# Patient Record
Sex: Male | Born: 1945
Health system: Southern US, Community
[De-identification: ages and names within clinical notes are randomized; demographics above are authoritative.]

## PROBLEM LIST (undated history)

## (undated) DIAGNOSIS — C801 Malignant (primary) neoplasm, unspecified: Secondary | ICD-10-CM

## (undated) DIAGNOSIS — J449 Chronic obstructive pulmonary disease, unspecified: Secondary | ICD-10-CM

## (undated) DIAGNOSIS — K219 Gastro-esophageal reflux disease without esophagitis: Secondary | ICD-10-CM

## (undated) DIAGNOSIS — E785 Hyperlipidemia, unspecified: Secondary | ICD-10-CM

## (undated) DIAGNOSIS — E119 Type 2 diabetes mellitus without complications: Secondary | ICD-10-CM

## (undated) DIAGNOSIS — F419 Anxiety disorder, unspecified: Secondary | ICD-10-CM

## (undated) HISTORY — DX: Type 2 diabetes mellitus without complications: E11.9

## (undated) HISTORY — DX: Chronic obstructive pulmonary disease, unspecified: J44.9

## (undated) HISTORY — DX: Anxiety disorder, unspecified: F41.9

## (undated) HISTORY — PX: EYE SURGERY: SHX253

## (undated) HISTORY — PX: APPENDECTOMY: SHX54

## (undated) HISTORY — DX: Hyperlipidemia, unspecified: E78.5

---

## 2005-07-02 ENCOUNTER — Emergency Department: Payer: Self-pay | Admitting: Unknown Physician Specialty

## 2005-07-12 ENCOUNTER — Emergency Department: Payer: Self-pay | Admitting: Unknown Physician Specialty

## 2005-11-04 ENCOUNTER — Ambulatory Visit: Payer: Self-pay | Admitting: Internal Medicine

## 2010-05-01 ENCOUNTER — Ambulatory Visit: Payer: Self-pay | Admitting: Family Medicine

## 2015-04-11 ENCOUNTER — Ambulatory Visit (INDEPENDENT_AMBULATORY_CARE_PROVIDER_SITE_OTHER): Payer: Medicare Other | Admitting: Family Medicine

## 2015-04-11 ENCOUNTER — Encounter: Payer: Self-pay | Admitting: Family Medicine

## 2015-04-11 VITALS — BP 107/56 | HR 85 | Temp 97.7°F | Ht 68.0 in | Wt 119.0 lb

## 2015-04-11 DIAGNOSIS — E1169 Type 2 diabetes mellitus with other specified complication: Secondary | ICD-10-CM | POA: Insufficient documentation

## 2015-04-11 DIAGNOSIS — E119 Type 2 diabetes mellitus without complications: Secondary | ICD-10-CM

## 2015-04-11 DIAGNOSIS — E785 Hyperlipidemia, unspecified: Secondary | ICD-10-CM | POA: Diagnosis not present

## 2015-04-11 LAB — BAYER DCA HB A1C WAIVED: HB A1C (BAYER DCA - WAIVED): 9.9 % — ABNORMAL HIGH (ref <7.0)

## 2015-04-11 NOTE — Assessment & Plan Note (Signed)
The current medical regimen is effective;  continue present plan and medications.  

## 2015-04-11 NOTE — Assessment & Plan Note (Signed)
Discuss poor control but better from last visit Discussed humalog dosing Offered endo referral and pt refusing for now Reviewed meds and taking

## 2015-04-11 NOTE — Progress Notes (Signed)
   BP 107/56 mmHg  Pulse 85  Temp(Src) 97.7 F (36.5 C)  Ht '5\' 8"'$  (1.727 m)  Wt 119 lb (53.978 kg)  BMI 18.10 kg/m2  SpO2 95%   Subjective:    Patient ID: Colin Novak, male    DOB: 1946/04/11, 69 y.o.   MRN: [id number]  HPI: Colin Novak is a 69 y.o. male  Chief Complaint  Patient presents with  . Diabetes  . Hyperlipidemia  Insulin using 10 u lantus and fasting glu around 100 Only taking humalog 10u at breakfast No prob with chol meds feeling well No leg feet c/o No hypo spells Problems have been long term  Relevant past medical, surgical, family and social history reviewed and updated as indicated. Interim medical history since our last visit reviewed. Allergies and medications reviewed and updated.  Review of Systems  Constitutional: Negative.   Respiratory: Negative.   Cardiovascular: Negative.     Per HPI unless specifically indicated above     Objective:    BP 107/56 mmHg  Pulse 85  Temp(Src) 97.7 F (36.5 C)  Ht '5\' 8"'$  (1.727 m)  Wt 119 lb (53.978 kg)  BMI 18.10 kg/m2  SpO2 95%  Wt Readings from Last 3 Encounters:  04/11/15 119 lb (53.978 kg)  12/13/14 112 lb (50.803 kg)    Physical Exam  Constitutional: He is oriented to person, place, and time. He appears well-developed and well-nourished. No distress.  HENT:  Head: Normocephalic and atraumatic.  Right Ear: Hearing normal.  Left Ear: Hearing normal.  Nose: Nose normal.  Eyes: Conjunctivae and lids are normal. Right eye exhibits no discharge. Left eye exhibits no discharge. No scleral icterus.  Cardiovascular: Normal rate, regular rhythm and normal heart sounds.   Pulmonary/Chest: Effort normal and breath sounds normal. No respiratory distress.  Musculoskeletal: Normal range of motion. He exhibits no edema.  Foot exam normal  Neurological: He is alert and oriented to person, place, and time. He displays no tremor. No sensory deficit.  Skin: Skin is intact. No rash noted.  Psychiatric: He  has a normal mood and affect. His speech is normal and behavior is normal. Judgment and thought content normal. Cognition and memory are normal.    No results found for this or any previous visit.    Assessment & Plan:   Problem List Items Addressed This Visit      Endocrine   Diabetes mellitus without complication - Primary    Discuss poor control but better from last visit Discussed humalog dosing Offered endo referral and pt refusing for now Reviewed meds and taking      Relevant Orders   Bayer DCA Hb A1c Waived   Basic metabolic panel   Hemoglobin A1c   Urinalysis, Routine w reflex microscopic (not at [facility])   TSH   Lipid panel   CBC with Differential/Platelet   Comprehensive metabolic panel     Other   Hyperlipidemia    The current medical regimen is effective;  continue present plan and medications.       Relevant Orders   Hemoglobin A1c   Urinalysis, Routine w reflex microscopic (not at [facility])   TSH   Lipid panel   CBC with Differential/Platelet   Comprehensive metabolic panel       Follow up plan: Return in about 3 months (around 07/12/2015) for Physical Exam.

## 2015-04-12 LAB — BASIC METABOLIC PANEL
BUN/Creatinine Ratio: 20 (ref 10–22)
BUN: 14 mg/dL (ref 8–27)
CO2: 21 mmol/L (ref 18–29)
Calcium: 9.8 mg/dL (ref 8.6–10.2)
Chloride: 100 mmol/L (ref 97–108)
Creatinine, Ser: 0.71 mg/dL — ABNORMAL LOW (ref 0.76–1.27)
GFR calc Af Amer: 110 mL/min/{1.73_m2} (ref >59)
GFR calc non Af Amer: 95 mL/min/{1.73_m2} (ref >59)
Glucose: 68 mg/dL (ref 65–99)
Potassium: 4.2 mmol/L (ref 3.5–5.2)
Sodium: 140 mmol/L (ref 134–144)

## 2015-05-07 ENCOUNTER — Other Ambulatory Visit: Payer: Self-pay | Admitting: Family Medicine

## 2015-06-18 ENCOUNTER — Other Ambulatory Visit: Payer: Self-pay | Admitting: Family Medicine

## 2015-07-18 ENCOUNTER — Telehealth: Payer: Self-pay

## 2015-07-18 ENCOUNTER — Other Ambulatory Visit: Payer: Self-pay | Admitting: Family Medicine

## 2015-07-18 MED ORDER — GLUCOSE BLOOD VI STRP: 1.0000 | ORAL_STRIP | Freq: Two times a day (BID) | Status: DC | PRN

## 2015-07-18 NOTE — Telephone Encounter (Signed)
[facility] requesting  Rx for One Touch Ultra Test Strips #100  11 refills  Note:patient tests twice daily AND DX

## 2015-07-19 ENCOUNTER — Other Ambulatory Visit: Payer: Self-pay

## 2015-07-19 MED ORDER — GLUCOSE BLOOD VI STRP: 1.0000 | ORAL_STRIP | Freq: Two times a day (BID) | Status: DC | PRN

## 2015-07-24 ENCOUNTER — Encounter: Payer: Self-pay | Admitting: Family Medicine

## 2015-07-24 ENCOUNTER — Ambulatory Visit (INDEPENDENT_AMBULATORY_CARE_PROVIDER_SITE_OTHER): Payer: Medicare Other | Admitting: Family Medicine

## 2015-07-24 VITALS — BP 94/63 | HR 93 | Temp 97.6°F | Ht 67.7 in | Wt 111.0 lb

## 2015-07-24 DIAGNOSIS — N400 Enlarged prostate without lower urinary tract symptoms: Secondary | ICD-10-CM | POA: Diagnosis not present

## 2015-07-24 DIAGNOSIS — E46 Unspecified protein-calorie malnutrition: Secondary | ICD-10-CM | POA: Diagnosis not present

## 2015-07-24 DIAGNOSIS — Z23 Encounter for immunization: Secondary | ICD-10-CM | POA: Diagnosis not present

## 2015-07-24 DIAGNOSIS — J42 Unspecified chronic bronchitis: Secondary | ICD-10-CM

## 2015-07-24 DIAGNOSIS — Z0000 Encounter for general adult medical examination without abnormal findings: Secondary | ICD-10-CM

## 2015-07-24 DIAGNOSIS — J449 Chronic obstructive pulmonary disease, unspecified: Secondary | ICD-10-CM | POA: Insufficient documentation

## 2015-07-24 DIAGNOSIS — Z125 Encounter for screening for malignant neoplasm of prostate: Secondary | ICD-10-CM | POA: Diagnosis not present

## 2015-07-24 DIAGNOSIS — E785 Hyperlipidemia, unspecified: Secondary | ICD-10-CM

## 2015-07-24 DIAGNOSIS — L989 Disorder of the skin and subcutaneous tissue, unspecified: Secondary | ICD-10-CM | POA: Diagnosis not present

## 2015-07-24 DIAGNOSIS — E119 Type 2 diabetes mellitus without complications: Secondary | ICD-10-CM | POA: Diagnosis not present

## 2015-07-24 LAB — URINALYSIS, ROUTINE W REFLEX MICROSCOPIC
Bilirubin, UA: NEGATIVE
Ketones, UA: NEGATIVE
Leukocytes, UA: NEGATIVE
Nitrite, UA: NEGATIVE
Specific Gravity, UA: 1.02 (ref 1.005–1.030)
Urobilinogen, Ur: 0.2 mg/dL (ref 0.2–1.0)
pH, UA: 5.5 (ref 5.0–7.5)

## 2015-07-24 LAB — MICROSCOPIC EXAMINATION: Renal Epithel, UA: NONE SEEN /hpf

## 2015-07-24 LAB — BAYER DCA HB A1C WAIVED: HB A1C (BAYER DCA - WAIVED): 8.6 % — ABNORMAL HIGH (ref <7.0)

## 2015-07-24 NOTE — Assessment & Plan Note (Signed)
Discussed malnutrition difficulty with healing need for better diet exercise and vitamins

## 2015-07-24 NOTE — Progress Notes (Signed)
BP 94/63 mmHg  Pulse 93  Temp(Src) 97.6 F (36.4 C)  Ht 5' 7.7" (1.72 m)  Wt 111 lb (50.349 kg)  BMI 17.02 kg/m2  SpO2 96%   Subjective:    Patient ID: Colin Novak, male    DOB: [birth year], 69 y.o.   MRN: [id number]  HPI: Colin Novak is a 69 y.o. male  Chief Complaint  Patient presents with  . Annual Exam   Patient follow-up for physical has all in all been doing well. Had [place] nurse visit about 2 weeks ago and comment on his blood pressure being low. As a consequence patient's been worried about medications causing low blood pressure and stopped everything but the insulin. Has not checked his blood pressure or blood sugar since. Has otherwise felt okay.   Patient's weight from this summer is down 8 pounds States it's hard to fix for just 1 person  Glucose was checked during nurse visit and was 100  Relevant past medical, surgical, family and social history reviewed and updated as indicated. Interim medical history since our last visit reviewed. Allergies and medications reviewed and updated.  Review of Systems  Constitutional: Negative.   HENT: Negative.   Eyes: Negative.   Respiratory: Negative.   Cardiovascular: Negative.   Gastrointestinal: Negative.   Endocrine: Negative.   Genitourinary: Negative.   Musculoskeletal: Negative.   Skin: Negative.   Allergic/Immunologic: Negative.   Neurological: Negative.   Hematological: Negative.   Psychiatric/Behavioral: Negative.     Per HPI unless specifically indicated above     Objective:    BP 94/63 mmHg  Pulse 93  Temp(Src) 97.6 F (36.4 C)  Ht 5' 7.7" (1.72 m)  Wt 111 lb (50.349 kg)  BMI 17.02 kg/m2  SpO2 96%  Wt Readings from Last 3 Encounters:  07/24/15 111 lb (50.349 kg)  04/11/15 119 lb (53.978 kg)  12/13/14 112 lb (50.803 kg)    Physical Exam  Constitutional: He is oriented to person, place, and time. He appears well-developed and well-nourished.  HENT:  Head: Normocephalic and  atraumatic.  Right Ear: External ear normal.  Left Ear: External ear normal.  Eyes: Conjunctivae and EOM are normal. Pupils are equal, round, and reactive to light.  Neck: Normal range of motion. Neck supple.  Cardiovascular: Normal rate, regular rhythm, normal heart sounds and intact distal pulses.   Pulmonary/Chest: Effort normal and breath sounds normal.  Abdominal: Soft. Bowel sounds are normal. There is no splenomegaly or hepatomegaly.  Genitourinary: Rectum normal and penis normal.  Prostate enlarged no nodules  Musculoskeletal: Normal range of motion.  Neurological: He is alert and oriented to person, place, and time. He has normal reflexes.  Skin: No rash noted. No erythema.  Nonhealing inflamed squamous cell appearing skin lesion right posterior neck occasionally bleeds when scratched stays irritated.  Psychiatric: He has a normal mood and affect. His behavior is normal. Judgment and thought content normal.    Results for orders placed or performed in visit on 04/11/15  Bayer DCA Hb A1c Waived  Result Value Ref Range   Bayer DCA Hb A1c Waived 9.9 (H) <6.9 %  Basic metabolic panel  Result Value Ref Range   Glucose 68 65 - 99 mg/dL   BUN 14 8 - 27 mg/dL   Creatinine, Ser 0.71 (L) 0.76 - 1.27 mg/dL   GFR calc non Af Amer 95 >59 mL/min/1.73   GFR calc Af Amer 110 >59 mL/min/1.73   BUN/Creatinine Ratio 20 10 - 22  Sodium 140 134 - 144 mmol/L   Potassium 4.2 3.5 - 5.2 mmol/L   Chloride 100 97 - 108 mmol/L   CO2 21 18 - 29 mmol/L   Calcium 9.8 8.6 - 10.2 mg/dL      Assessment & Plan:   Problem List Items Addressed This Visit      Respiratory   COPD (chronic obstructive pulmonary disease)    Stable with no complaints        Endocrine   Diabetes mellitus without complication    Continued poor control will get back on medications and eat better      Relevant Orders   Comprehensive metabolic panel   Bayer DCA Hb A1c Waived     Musculoskeletal and Integument    Skin lesion     Genitourinary   BPH (benign prostatic hyperplasia)   Relevant Orders   PSA   Urinalysis, Routine w reflex microscopic (not at [facility])     Other   Hyperlipidemia    The current medical regimen is effective;  continue present plan and medications.       Relevant Orders   Comprehensive metabolic panel   Lipid panel   Malnutrition    Discussed malnutrition difficulty with healing need for better diet exercise and vitamins      Relevant Orders   CBC with Differential/Platelet   Urinalysis, Routine w reflex microscopic (not at [facility])   TSH    Other Visit Diagnoses    Immunization due    -  Primary    Relevant Orders    Flu Vaccine QUAD 36+ mos PF IM (Fluarix & Fluzone Quad PF) (Completed)    PE (physical exam), annual        Relevant Orders    Comprehensive metabolic panel    Lipid panel    CBC with Differential/Platelet    PSA    Urinalysis, Routine w reflex microscopic (not at [facility])    TSH        Follow up plan: Return in about 3 months (around 10/23/2015), or if symptoms worsen or fail to improve, for Appointment shave biopsy neck lesion, Physical Exam.

## 2015-07-24 NOTE — Assessment & Plan Note (Signed)
Stable with no complaints

## 2015-07-24 NOTE — Assessment & Plan Note (Signed)
Continued poor control will get back on medications and eat better

## 2015-07-24 NOTE — Assessment & Plan Note (Signed)
The current medical regimen is effective;  continue present plan and medications.  

## 2015-07-25 ENCOUNTER — Encounter: Payer: Self-pay | Admitting: Family Medicine

## 2015-07-25 LAB — COMPREHENSIVE METABOLIC PANEL
ALT: 23 IU/L (ref 0–44)
AST: 16 IU/L (ref 0–40)
Albumin/Globulin Ratio: 1.9 (ref 1.1–2.5)
Albumin: 4.4 g/dL (ref 3.5–4.8)
Alkaline Phosphatase: 81 IU/L (ref 39–117)
BUN/Creatinine Ratio: 20 (ref 10–22)
BUN: 14 mg/dL (ref 8–27)
Bilirubin Total: 0.3 mg/dL (ref 0.0–1.2)
CO2: 25 mmol/L (ref 18–29)
Calcium: 9.3 mg/dL (ref 8.6–10.2)
Chloride: 99 mmol/L (ref 97–108)
Creatinine, Ser: 0.71 mg/dL — ABNORMAL LOW (ref 0.76–1.27)
GFR calc Af Amer: 110 mL/min/{1.73_m2} (ref >59)
GFR calc non Af Amer: 95 mL/min/{1.73_m2} (ref >59)
Globulin, Total: 2.3 g/dL (ref 1.5–4.5)
Glucose: 195 mg/dL — ABNORMAL HIGH (ref 65–99)
Potassium: 4.9 mmol/L (ref 3.5–5.2)
Sodium: 140 mmol/L (ref 134–144)
Total Protein: 6.7 g/dL (ref 6.0–8.5)

## 2015-07-25 LAB — CBC WITH DIFFERENTIAL/PLATELET
Basophils Absolute: 0.1 10*3/uL (ref 0.0–0.2)
Basos: 1 %
EOS (ABSOLUTE): 0.1 10*3/uL (ref 0.0–0.4)
Eos: 1 %
Hematocrit: 46.1 % (ref 37.5–51.0)
Hemoglobin: 16.3 g/dL (ref 12.6–17.7)
Immature Grans (Abs): 0 10*3/uL (ref 0.0–0.1)
Immature Granulocytes: 0 %
Lymphocytes Absolute: 2.3 10*3/uL (ref 0.7–3.1)
Lymphs: 32 %
MCH: 33.6 pg — ABNORMAL HIGH (ref 26.6–33.0)
MCHC: 35.4 g/dL (ref 31.5–35.7)
MCV: 95 fL (ref 79–97)
Monocytes Absolute: 0.7 10*3/uL (ref 0.1–0.9)
Monocytes: 10 %
Neutrophils Absolute: 4.1 10*3/uL (ref 1.4–7.0)
Neutrophils: 56 %
Platelets: 246 10*3/uL (ref 150–379)
RBC: 4.85 x10E6/uL (ref 4.14–5.80)
RDW: 12.8 % (ref 12.3–15.4)
WBC: 7.3 10*3/uL (ref 3.4–10.8)

## 2015-07-25 LAB — LIPID PANEL
Chol/HDL Ratio: 2.3 ratio units (ref 0.0–5.0)
Cholesterol, Total: 113 mg/dL (ref 100–199)
HDL: 49 mg/dL (ref >39)
LDL Calculated: 39 mg/dL (ref 0–99)
Triglycerides: 126 mg/dL (ref 0–149)
VLDL Cholesterol Cal: 25 mg/dL (ref 5–40)

## 2015-07-25 LAB — PSA: Prostate Specific Ag, Serum: 2.7 ng/mL (ref 0.0–4.0)

## 2015-07-25 LAB — TSH: TSH: 0.83 u[IU]/mL (ref 0.450–4.500)

## 2015-10-22 ENCOUNTER — Other Ambulatory Visit: Payer: Self-pay | Admitting: Family Medicine

## 2015-10-23 NOTE — Telephone Encounter (Signed)
Due for physical.

## 2015-10-23 NOTE — Telephone Encounter (Signed)
Please schedule for CPE with MAC, DJ will do med refill once he is scheduled

## 2015-10-24 NOTE — Telephone Encounter (Signed)
Appt made 12/05/15

## 2015-11-19 LAB — HM DIABETES EYE EXAM

## 2015-11-20 ENCOUNTER — Other Ambulatory Visit: Payer: Self-pay

## 2015-11-20 NOTE — Telephone Encounter (Signed)
Requesting 90 day Rx [facility]  Patient has appointment 12/05/15 with MAC

## 2015-11-21 MED ORDER — EZETIMIBE-SIMVASTATIN 10-40 MG PO TABS: 1.0000 | ORAL_TABLET | Freq: Every day | ORAL | Status: DC

## 2015-11-23 ENCOUNTER — Telehealth: Payer: Self-pay | Admitting: Family Medicine

## 2015-11-23 NOTE — Telephone Encounter (Signed)
[facility] called and stated that the pt needed a new rx sent with diagnosis codes and directions sent for test strips.

## 2015-11-26 NOTE — Telephone Encounter (Signed)
Paperwork being worked on for details, will fax

## 2015-11-27 NOTE — Telephone Encounter (Signed)
This encounter was created in error - please disregard.

## 2015-11-28 ENCOUNTER — Telehealth: Payer: Self-pay | Admitting: Family Medicine

## 2015-11-28 NOTE — Telephone Encounter (Signed)
Pharmacy called in and stated that they received a faxed rx for novofine pen needles but the directions weren't specific. They would like to have a new rx sent over either e scribe or fax with specific directions and diagnosis codes.

## 2015-11-29 MED ORDER — NOVOFINE 32G X 6 MM MISC: Status: DC

## 2015-12-03 ENCOUNTER — Other Ambulatory Visit: Payer: Self-pay | Admitting: Family Medicine

## 2015-12-03 MED ORDER — NOVOFINE 32G X 6 MM MISC: Status: DC

## 2015-12-05 ENCOUNTER — Ambulatory Visit (INDEPENDENT_AMBULATORY_CARE_PROVIDER_SITE_OTHER): Payer: Medicare Other | Admitting: Family Medicine

## 2015-12-05 ENCOUNTER — Encounter: Payer: Self-pay | Admitting: Family Medicine

## 2015-12-05 VITALS — BP 99/58 | HR 80 | Temp 97.6°F | Ht 68.0 in | Wt 121.0 lb

## 2015-12-05 DIAGNOSIS — L989 Disorder of the skin and subcutaneous tissue, unspecified: Secondary | ICD-10-CM

## 2015-12-05 DIAGNOSIS — J42 Unspecified chronic bronchitis: Secondary | ICD-10-CM

## 2015-12-05 DIAGNOSIS — Z23 Encounter for immunization: Secondary | ICD-10-CM | POA: Diagnosis not present

## 2015-12-05 DIAGNOSIS — E46 Unspecified protein-calorie malnutrition: Secondary | ICD-10-CM

## 2015-12-05 DIAGNOSIS — L821 Other seborrheic keratosis: Secondary | ICD-10-CM | POA: Diagnosis not present

## 2015-12-05 DIAGNOSIS — E119 Type 2 diabetes mellitus without complications: Secondary | ICD-10-CM | POA: Diagnosis not present

## 2015-12-05 DIAGNOSIS — D485 Neoplasm of uncertain behavior of skin: Secondary | ICD-10-CM | POA: Diagnosis not present

## 2015-12-05 DIAGNOSIS — E785 Hyperlipidemia, unspecified: Secondary | ICD-10-CM | POA: Diagnosis not present

## 2015-12-05 LAB — LP+ALT+AST PICCOLO, WAIVED
ALT (SGPT) Piccolo, Waived: 25 U/L (ref 10–47)
AST (SGOT) Piccolo, Waived: 28 U/L (ref 11–38)
Chol/HDL Ratio Piccolo,Waive: 2.3 mg/dL
Cholesterol Piccolo, Waived: 101 mg/dL (ref <200)
HDL Chol Piccolo, Waived: 45 mg/dL — ABNORMAL LOW (ref >59)
LDL Chol Calc Piccolo Waived: 35 mg/dL (ref <100)
Triglycerides Piccolo,Waived: 108 mg/dL (ref <150)
VLDL Chol Calc Piccolo,Waive: 22 mg/dL (ref <30)

## 2015-12-05 LAB — BAYER DCA HB A1C WAIVED: HB A1C (BAYER DCA - WAIVED): 8.7 % — ABNORMAL HIGH (ref <7.0)

## 2015-12-05 MED ORDER — CANAGLIFLOZIN 300 MG PO TABS: 300.0000 mg | ORAL_TABLET | Freq: Every day | ORAL | Status: DC

## 2015-12-05 MED ORDER — METFORMIN HCL 500 MG PO TABS: 500.0000 mg | ORAL_TABLET | Freq: Two times a day (BID) | ORAL | Status: DC

## 2015-12-05 MED ORDER — INSULIN GLARGINE 100 UNIT/ML SOLOSTAR PEN: 10.0000 [IU] | PEN_INJECTOR | Freq: Every day | SUBCUTANEOUS | Status: DC

## 2015-12-05 MED ORDER — INSULIN LISPRO 100 UNIT/ML (KWIKPEN): PEN_INJECTOR | SUBCUTANEOUS | Status: DC

## 2015-12-05 NOTE — Addendum Note (Signed)
Addended byGolden Pop on: 12/05/2015 01:43 PM   Modules accepted: Miquel Dunn

## 2015-12-05 NOTE — Progress Notes (Signed)
BP 99/58 mmHg  Pulse 80  Temp(Src) 97.6 F (36.4 C)  Ht '5\' 8"'$  (1.727 m)  Wt 121 lb (54.885 kg)  BMI 18.40 kg/m2  SpO2 95%   Subjective:    Patient ID: Colin Novak, male    DOB: Dec 07, 1945, 70 y.o.   MRN: [id number]  HPI: Colin Novak is a 70 y.o. male  Chief Complaint  Patient presents with  . Diabetes  . Hyperlipidemia   patient recheck diabetes not checking glucoses and home are just an insulin but seems like use doing better according to the patient patient eating a little better Patient is gained a little bit of weight Taking medications with no side effects and taking faithfully Lesion on neck is still irritated occasionally bleeds scratches a lot and does not heal.  Relevant past medical, surgical, family and social history reviewed and updated as indicated. Interim medical history since our last visit reviewed. Allergies and medications reviewed and updated.  Review of Systems  Constitutional: Negative.   Respiratory: Negative.   Cardiovascular: Negative.     Per HPI unless specifically indicated above     Objective:    BP 99/58 mmHg  Pulse 80  Temp(Src) 97.6 F (36.4 C)  Ht '5\' 8"'$  (1.727 m)  Wt 121 lb (54.885 kg)  BMI 18.40 kg/m2  SpO2 95%  Wt Readings from Last 3 Encounters:  12/05/15 121 lb (54.885 kg)  07/24/15 111 lb (50.349 kg)  04/11/15 119 lb (53.978 kg)    Physical Exam  Constitutional: He is oriented to person, place, and time. He appears well-developed and well-nourished. No distress.  HENT:  Head: Normocephalic and atraumatic.  Right Ear: Hearing normal.  Left Ear: Hearing normal.  Nose: Nose normal.  Eyes: Conjunctivae and lids are normal. Right eye exhibits no discharge. Left eye exhibits no discharge. No scleral icterus.  Cardiovascular: Normal rate and regular rhythm.   Pulmonary/Chest: Effort normal and breath sounds normal. No respiratory distress.  Musculoskeletal: Normal range of motion.  Neurological: He is alert and  oriented to person, place, and time.  Skin: Skin is intact. No rash noted.  Nonhealing skin lesions  Psychiatric: He has a normal mood and affect. His speech is normal and behavior is normal. Judgment and thought content normal. Cognition and memory are normal.    Results for orders placed or performed in visit on 11/22/15  HM DIABETES EYE EXAM  Result Value Ref Range   HM Diabetic Eye Exam No Retinopathy No Retinopathy      Assessment & Plan:   Problem List Items Addressed This Visit      Respiratory   COPD (chronic obstructive pulmonary disease) ([place])    The current medical regimen is effective;  continue present plan and medications.         Endocrine   Diabetes mellitus without complication ([place])    Discussed diabetes care and treatment of adjusting insulin checking blood sugar gave patient new glucometer      Relevant Medications   metFORMIN (GLUCOPHAGE) 500 MG tablet   canagliflozin (INVOKANA) 300 MG TABS tablet   Insulin Glargine (LANTUS) 100 UNIT/ML Solostar Pen   insulin lispro (HUMALOG KWIKPEN) 100 UNIT/ML KiwkPen   Other Relevant Orders   Bayer DCA Hb A1c Waived     Musculoskeletal and Integument   Skin lesion    Informed consent on skin lesion and shave biopsy to lesions proximally 1 cm were prepped with Betadine and alcohol and infiltrated with Xylocaine with epinephrine and  shaved off bleeding controlled with silver nitrate stick. Patient tolerated the procedure well. Lesions sent for pathology able top and bottom from neck      Relevant Orders   Pathology     Other   Malnutrition ([place])    Starting the better and gained a little bit of weight      Relevant Orders   Basic metabolic panel   Hyperlipidemia    The current medical regimen is effective;  continue present plan and medications.       Relevant Orders   LP+ALT+AST Piccolo, [place]    Other Visit Diagnoses    Immunization due    -  Primary    Relevant Orders    Pneumococcal  polysaccharide vaccine 23-valent greater than or equal to 2yo subcutaneous/IM (Completed)        Follow up plan: Return in about 3 months (around 03/03/2016) for a1c.

## 2015-12-05 NOTE — Assessment & Plan Note (Signed)
Discussed diabetes care and treatment of adjusting insulin checking blood sugar gave patient new glucometer

## 2015-12-05 NOTE — Assessment & Plan Note (Signed)
The current medical regimen is effective;  continue present plan and medications.  

## 2015-12-05 NOTE — Assessment & Plan Note (Signed)
Starting the better and gained a little bit of weight

## 2015-12-05 NOTE — Assessment & Plan Note (Signed)
Informed consent on skin lesion and shave biopsy to lesions proximally 1 cm were prepped with Betadine and alcohol and infiltrated with Xylocaine with epinephrine and shaved off bleeding controlled with silver nitrate stick. Patient tolerated the procedure well. Lesions sent for pathology able top and bottom from neck

## 2015-12-06 ENCOUNTER — Encounter: Payer: Self-pay | Admitting: Family Medicine

## 2015-12-06 LAB — BASIC METABOLIC PANEL
BUN/Creatinine Ratio: 25 — ABNORMAL HIGH (ref 10–22)
BUN: 19 mg/dL (ref 8–27)
CO2: 24 mmol/L (ref 18–29)
Calcium: 9.6 mg/dL (ref 8.6–10.2)
Chloride: 99 mmol/L (ref 96–106)
Creatinine, Ser: 0.75 mg/dL — ABNORMAL LOW (ref 0.76–1.27)
GFR calc Af Amer: 107 mL/min/{1.73_m2} (ref >59)
GFR calc non Af Amer: 93 mL/min/{1.73_m2} (ref >59)
Glucose: 98 mg/dL (ref 65–99)
Potassium: 4.5 mmol/L (ref 3.5–5.2)
Sodium: 140 mmol/L (ref 134–144)

## 2015-12-07 LAB — PATHOLOGY

## 2015-12-10 ENCOUNTER — Telehealth: Payer: Self-pay | Admitting: Family Medicine

## 2015-12-10 NOTE — Telephone Encounter (Signed)
-----   Message from [place] sent at 12/10/2015  4:37 PM EST ----- labs

## 2015-12-10 NOTE — Telephone Encounter (Signed)
Phone call Discussed with patient biopsy report patient will keep covered with Vaseline keep his hands off and if lesion doesn't heal will reevaluate

## 2016-01-28 ENCOUNTER — Telehealth: Payer: Self-pay | Admitting: Family Medicine

## 2016-01-28 ENCOUNTER — Telehealth: Payer: Self-pay

## 2016-01-28 NOTE — Telephone Encounter (Signed)
[facility] Rx for [employer] faxed

## 2016-01-28 NOTE — Telephone Encounter (Signed)
Patient needs refill

## 2016-03-03 ENCOUNTER — Encounter: Payer: Self-pay | Admitting: Family Medicine

## 2016-03-06 ENCOUNTER — Ambulatory Visit: Payer: Medicare Other | Admitting: Family Medicine

## 2016-05-01 ENCOUNTER — Telehealth: Payer: Self-pay | Admitting: Family Medicine

## 2016-05-01 NOTE — Telephone Encounter (Signed)
Pt called needs lancets and test strips for Accu Check [employer], Accu Check soft clicks lancets. Pharm needs to be changed to [facility] in [place]. Thanks.

## 2016-05-01 NOTE — Telephone Encounter (Signed)
faxed

## 2016-05-05 ENCOUNTER — Other Ambulatory Visit: Payer: Self-pay | Admitting: Family Medicine

## 2016-07-09 ENCOUNTER — Ambulatory Visit (INDEPENDENT_AMBULATORY_CARE_PROVIDER_SITE_OTHER): Payer: Medicare Other | Admitting: Family Medicine

## 2016-07-09 ENCOUNTER — Encounter: Payer: Self-pay | Admitting: Family Medicine

## 2016-07-09 VITALS — BP 97/61 | HR 88 | Temp 97.9°F | Wt 115.0 lb

## 2016-07-09 DIAGNOSIS — E119 Type 2 diabetes mellitus without complications: Secondary | ICD-10-CM

## 2016-07-09 DIAGNOSIS — J42 Unspecified chronic bronchitis: Secondary | ICD-10-CM

## 2016-07-09 DIAGNOSIS — Z23 Encounter for immunization: Secondary | ICD-10-CM

## 2016-07-09 DIAGNOSIS — E46 Unspecified protein-calorie malnutrition: Secondary | ICD-10-CM

## 2016-07-09 LAB — BAYER DCA HB A1C WAIVED: HB A1C (BAYER DCA - WAIVED): 9.5 % — ABNORMAL HIGH (ref <7.0)

## 2016-07-09 LAB — MICROALBUMIN, URINE WAIVED
Creatinine, Urine Waived: 50 mg/dL (ref 10–300)
Microalb, Ur Waived: 10 mg/L (ref 0–19)

## 2016-07-09 MED ORDER — DULAGLUTIDE 0.75 MG/0.5ML ~~LOC~~ SOAJ: 0.7500 mg | SUBCUTANEOUS | 12 refills | Status: DC

## 2016-07-09 MED ORDER — EZETIMIBE-SIMVASTATIN 10-40 MG PO TABS: 1.0000 | ORAL_TABLET | Freq: Every day | ORAL | 1 refills | Status: DC

## 2016-07-09 NOTE — Assessment & Plan Note (Signed)
Control and noncompliance with diabetes medications reviewed importance of compliance issue of 9.5 hemoglobin A1c getting worse reviewed financial issues. Gave samples of Trulicity 9.39 mg and patient self-administered first dose here today and we'll give himself weekly. Patient will explore costs and medication usage in the meantime

## 2016-07-09 NOTE — Progress Notes (Signed)
BP 97/61 (BP Location: Left Arm, Patient Position: Sitting, Cuff Size: Small)   Pulse 88   Temp 97.9 F (36.6 C)   Wt 115 lb (52.2 kg) Comment: with shoes  SpO2 96%   BMI 17.49 kg/m    Subjective:    Patient ID: Colin Novak, male    DOB: 1946-01-08, 70 y.o.   MRN: [id number]  HPI: MAHAMED ZALEWSKI is a 70 y.o. male  Chief Complaint  Patient presents with  . Diabetes  . URI  Patient with mild head cold is getting better wondering if he can get flu shot symptoms and no fever will go ahead with flu shot Reviewed patient not taking Humalog until today. Had some low sugar spells previously Taking Lantus 10 units and fasting blood sugars in the 80s to 90s. Reviewed with patient about other medications and seems to be taking having some financial difficulty also.  Relevant past medical, surgical, family and social history reviewed and updated as indicated. Interim medical history since our last visit reviewed. Allergies and medications reviewed and updated.  Review of Systems  Constitutional: Negative.   Respiratory: Negative.   Cardiovascular: Negative.     Per HPI unless specifically indicated above     Objective:    BP 97/61 (BP Location: Left Arm, Patient Position: Sitting, Cuff Size: Small)   Pulse 88   Temp 97.9 F (36.6 C)   Wt 115 lb (52.2 kg) Comment: with shoes  SpO2 96%   BMI 17.49 kg/m   Wt Readings from Last 3 Encounters:  07/09/16 115 lb (52.2 kg)  12/05/15 121 lb (54.9 kg)  07/24/15 111 lb (50.3 kg)    Physical Exam  Constitutional: He is oriented to person, place, and time. He appears well-developed and well-nourished. No distress.  HENT:  Head: Normocephalic and atraumatic.  Right Ear: Hearing normal.  Left Ear: Hearing normal.  Nose: Nose normal.  Eyes: Conjunctivae and lids are normal. Right eye exhibits no discharge. Left eye exhibits no discharge. No scleral icterus.  Cardiovascular: Normal rate, regular rhythm and normal heart sounds.     Pulmonary/Chest: Effort normal and breath sounds normal. No respiratory distress.  Musculoskeletal: Normal range of motion.  Neurological: He is alert and oriented to person, place, and time.  Skin: Skin is intact. No rash noted.  Psychiatric: He has a normal mood and affect. His speech is normal and behavior is normal. Judgment and thought content normal. Cognition and memory are normal.    Results for orders placed or performed in visit on 30/16/01  Basic metabolic panel  Result Value Ref Range   Glucose 98 65 - 99 mg/dL   BUN 19 8 - 27 mg/dL   Creatinine, Ser 0.75 (L) 0.76 - 1.27 mg/dL   GFR calc non Af Amer 93 >59 mL/min/1.73   GFR calc Af Amer 107 >59 mL/min/1.73   BUN/Creatinine Ratio 25 (H) 10 - 22   Sodium 140 134 - 144 mmol/L   Potassium 4.5 3.5 - 5.2 mmol/L   Chloride 99 96 - 106 mmol/L   CO2 24 18 - 29 mmol/L   Calcium 9.6 8.6 - 10.2 mg/dL  Bayer DCA Hb A1c Waived  Result Value Ref Range   Bayer DCA Hb A1c Waived 8.7 (H) <7.0 %  LP+ALT+AST Piccolo, Waived  Result Value Ref Range   ALT (SGPT) Piccolo, Waived 25 10 - 47 U/L   AST (SGOT) Piccolo, Waived 28 11 - 38 U/L   Cholesterol Piccolo, Waived 101 <  200 mg/dL   HDL Chol Piccolo, Waived 45 (L) >59 mg/dL   Triglycerides Piccolo,Waived 108 <150 mg/dL   Chol/HDL Ratio Piccolo,Waive 2.3 mg/dL   LDL Chol Calc Piccolo Waived 35 <100 mg/dL   VLDL Chol Calc Piccolo,Waive 22 <30 mg/dL  Pathology  Result Value Ref Range   PATH REPORT.SITE OF ORIGIN SPEC Comment    . Comment    PATH REPORT.RELEVANT HX SPEC Comment    PATH REPORT.FINAL DX SPEC Comment    SIGNED OUT BY: Comment    GROSS DESCRIPTION: Comment    . Comment    PAYMENT PROCEDURE Comment       Assessment & Plan:   Problem List Items Addressed This Visit      Respiratory   COPD (chronic obstructive pulmonary disease) ([place])    The current medical regimen is effective;  continue present plan and medications.         Endocrine   Diabetes mellitus  without complication ([place]) - Primary    Control and noncompliance with diabetes medications reviewed importance of compliance issue of 9.5 hemoglobin A1c getting worse reviewed financial issues. Gave samples of Trulicity 9.62 mg and patient self-administered first dose here today and we'll give himself weekly. Patient will explore costs and medication usage in the meantime       Relevant Medications   Dulaglutide (TRULICITY) 8.36 OQ/9.4TM SOPN   Other Relevant Orders   Bayer DCA Hb A1c Waived   Microalbumin, Urine Waived     Other   Malnutrition ([place])    Discussed diet nutrition and eating patient's lost weight from last visit but diabetes extremely poor control.       Other Visit Diagnoses   None.      Follow up plan: Return in about 4 weeks (around 08/06/2016) for Recheck in about a month and hopefully will be up to afford medications if still not doing better wi.

## 2016-07-09 NOTE — Assessment & Plan Note (Signed)
Discussed diet nutrition and eating patient's lost weight from last visit but diabetes extremely poor control.

## 2016-07-09 NOTE — Assessment & Plan Note (Signed)
The current medical regimen is effective;  continue present plan and medications.  

## 2016-07-09 NOTE — Patient Instructions (Signed)

## 2016-07-09 NOTE — Addendum Note (Signed)
Addended by: Moss Mc on: 07/09/2016 02:02 PM   Modules accepted: Orders

## 2016-07-10 ENCOUNTER — Telehealth: Payer: Self-pay | Admitting: Family Medicine

## 2016-07-10 NOTE — Telephone Encounter (Signed)
Portia from [facility] called to inform PCP that Dulaglutide (TRULICITY) 7.34 KA/7.6OT SOPN was denied and they asked that the pt first try Victoza. A letter will be mailed to the office.

## 2016-07-14 MED ORDER — LIRAGLUTIDE 18 MG/3ML ~~LOC~~ SOPN: 1.8000 mg | PEN_INJECTOR | Freq: Every day | SUBCUTANEOUS | 12 refills | Status: DC

## 2016-08-06 ENCOUNTER — Ambulatory Visit (INDEPENDENT_AMBULATORY_CARE_PROVIDER_SITE_OTHER): Payer: Medicare Other | Admitting: Family Medicine

## 2016-08-06 ENCOUNTER — Encounter: Payer: Self-pay | Admitting: Family Medicine

## 2016-08-06 VITALS — BP 84/50 | HR 83 | Temp 97.8°F | Wt 113.8 lb

## 2016-08-06 DIAGNOSIS — E119 Type 2 diabetes mellitus without complications: Secondary | ICD-10-CM | POA: Diagnosis not present

## 2016-08-06 DIAGNOSIS — J42 Unspecified chronic bronchitis: Secondary | ICD-10-CM | POA: Diagnosis not present

## 2016-08-06 DIAGNOSIS — J209 Acute bronchitis, unspecified: Secondary | ICD-10-CM

## 2016-08-06 DIAGNOSIS — E440 Moderate protein-calorie malnutrition: Secondary | ICD-10-CM

## 2016-08-06 MED ORDER — AZITHROMYCIN 250 MG PO TABS: ORAL_TABLET | ORAL | 0 refills | Status: DC

## 2016-08-06 NOTE — Assessment & Plan Note (Signed)
Reviewed diet and nutrition

## 2016-08-06 NOTE — Assessment & Plan Note (Signed)
Reviewed dosing on Victoza gave written directions

## 2016-08-06 NOTE — Addendum Note (Signed)
Addended byGolden Pop on: 08/06/2016 01:44 PM   Modules accepted: Orders

## 2016-08-06 NOTE — Progress Notes (Addendum)
BP (!) 84/50 (BP Location: Left Arm, Patient Position: Sitting, Cuff Size: Normal)   Pulse 83   Temp 97.8 F (36.6 C)   Wt 113 lb 12.8 oz (51.6 kg)   SpO2 96%   BMI 17.30 kg/m    Subjective:    Patient ID: Colin Novak, male    DOB: 02-11-1946, 70 y.o.   MRN: [id number]  HPI: MARTAVIOUS HARTEL is a 70 y.o. male  Patient follow-up diabetes and change of medications Trulicity not covered by insurance but Victoza is. Patient uses his last Trulicity sample last weeks now ready to start Victoza daily injections. Discussed with patient taper up by 0.6 every 3-4 days. Gave written directions. Hasn't noticed any other issues with Trulicity. Taking other medications cholesterol Reviewed weight patient still losing weight Reviewed diet and nutrition and importance. Patient also with exacerbation of COPD with coughing barking been ongoing for weeks and getting worse. Relevant past medical, surgical, family and social history reviewed and updated as indicated. Interim medical history since our last visit reviewed. Allergies and medications reviewed and updated.  Review of Systems  Constitutional: Negative.   Respiratory: Negative.   Cardiovascular: Negative.     Per HPI unless specifically indicated above     Objective:    BP (!) 84/50 (BP Location: Left Arm, Patient Position: Sitting, Cuff Size: Normal)   Pulse 83   Temp 97.8 F (36.6 C)   Wt 113 lb 12.8 oz (51.6 kg)   SpO2 96%   BMI 17.30 kg/m   Wt Readings from Last 3 Encounters:  08/06/16 113 lb 12.8 oz (51.6 kg)  07/09/16 115 lb (52.2 kg)  12/05/15 121 lb (54.9 kg)    Physical Exam  Constitutional: He is oriented to person, place, and time. He appears well-developed and well-nourished. No distress.  HENT:  Head: Normocephalic and atraumatic.  Right Ear: Hearing normal.  Left Ear: Hearing normal.  Nose: Nose normal.  Eyes: Conjunctivae and lids are normal. Right eye exhibits no discharge. Left eye exhibits no  discharge. No scleral icterus.  Cardiovascular: Normal rate, regular rhythm and normal heart sounds.   Pulmonary/Chest: Effort normal and breath sounds normal. No respiratory distress.  Musculoskeletal: Normal range of motion.  Neurological: He is alert and oriented to person, place, and time.  Skin: Skin is intact. No rash noted.  Psychiatric: He has a normal mood and affect. His speech is normal and behavior is normal. Judgment and thought content normal. Cognition and memory are normal.    Results for orders placed or performed in visit on 07/09/16  Bayer DCA Hb A1c Waived  Result Value Ref Range   Bayer DCA Hb A1c Waived 9.5 (H) <7.0 %  Microalbumin, Urine Waived  Result Value Ref Range   Microalb, Ur Waived 10 0 - 19 mg/L   Creatinine, Urine Waived 50 10 - 300 mg/dL   Microalb/Creat Ratio 30-300 (H) <30 mg/g      Assessment & Plan:   Problem List Items Addressed This Visit      Endocrine   Diabetes mellitus without complication ([place])    Reviewed dosing on Victoza gave written directions        Other   Malnutrition ([place])    Reviewed diet and nutrition       Other Visit Diagnoses    Chronic bronchitis with acute exacerbation ([place])    -  Primary   Relevant Medications   azithromycin (ZITHROMAX) 250 MG tablet      Treatment  of AE CB Follow up plan: Return in about 2 months (around 10/06/2016) for Hemoglobin A1c.

## 2016-10-06 ENCOUNTER — Encounter: Payer: Self-pay | Admitting: Family Medicine

## 2016-10-06 ENCOUNTER — Ambulatory Visit (INDEPENDENT_AMBULATORY_CARE_PROVIDER_SITE_OTHER): Payer: Medicare Other | Admitting: Family Medicine

## 2016-10-06 ENCOUNTER — Ambulatory Visit: Payer: Medicare Other | Admitting: Family Medicine

## 2016-10-06 VITALS — BP 83/44 | HR 87 | Temp 97.5°F | Ht 68.5 in | Wt 114.4 lb

## 2016-10-06 DIAGNOSIS — Z1159 Encounter for screening for other viral diseases: Secondary | ICD-10-CM

## 2016-10-06 DIAGNOSIS — N401 Enlarged prostate with lower urinary tract symptoms: Secondary | ICD-10-CM | POA: Diagnosis not present

## 2016-10-06 DIAGNOSIS — J42 Unspecified chronic bronchitis: Secondary | ICD-10-CM

## 2016-10-06 DIAGNOSIS — Z0000 Encounter for general adult medical examination without abnormal findings: Secondary | ICD-10-CM

## 2016-10-06 DIAGNOSIS — E119 Type 2 diabetes mellitus without complications: Secondary | ICD-10-CM

## 2016-10-06 DIAGNOSIS — E7800 Pure hypercholesterolemia, unspecified: Secondary | ICD-10-CM

## 2016-10-06 DIAGNOSIS — R350 Frequency of micturition: Secondary | ICD-10-CM | POA: Diagnosis not present

## 2016-10-06 DIAGNOSIS — E440 Moderate protein-calorie malnutrition: Secondary | ICD-10-CM | POA: Diagnosis not present

## 2016-10-06 DIAGNOSIS — Z23 Encounter for immunization: Secondary | ICD-10-CM | POA: Diagnosis not present

## 2016-10-06 LAB — URINALYSIS, ROUTINE W REFLEX MICROSCOPIC
Bilirubin, UA: NEGATIVE
Ketones, UA: NEGATIVE
Leukocytes, UA: NEGATIVE
Nitrite, UA: NEGATIVE
Protein, UA: NEGATIVE
Specific Gravity, UA: 1.01 (ref 1.005–1.030)
Urobilinogen, Ur: 0.2 mg/dL (ref 0.2–1.0)
pH, UA: 6 (ref 5.0–7.5)

## 2016-10-06 LAB — MICROSCOPIC EXAMINATION
Bacteria, UA: NONE SEEN
RBC, UA: NONE SEEN /hpf (ref 0–?)

## 2016-10-06 LAB — BAYER DCA HB A1C WAIVED: HB A1C (BAYER DCA - WAIVED): 8.3 % — ABNORMAL HIGH (ref <7.0)

## 2016-10-06 NOTE — Assessment & Plan Note (Signed)
Discuss eating and nutrition importance for diabetes control.

## 2016-10-06 NOTE — Progress Notes (Signed)
BP (!) 83/44 (BP Location: Left Arm, Patient Position: Sitting, Cuff Size: Normal)   Pulse 87   Temp 97.5 F (36.4 C)   Ht 5' 8.5" (1.74 m)   Wt 114 lb 6.4 oz (51.9 kg)   SpO2 95%   BMI 17.14 kg/m    Subjective:    Patient ID: Colin Novak, male    DOB: [birth year], 70 y.o.   MRN: [id number]  HPI: KIERNAN Novak is a 70 y.o. male  Chief Complaint  Patient presents with  . Diabetes  Annual exam AWV metrics met  Patient follow-up doing well no complaints from diabetes noted low blood sugar spells using his diabetes medicines without problems. We are hopeful that may be things are getting too low with a low A1c will build Back on some of his expensive medicines. Doing well with cholesterol no issues Malnutrition still having problems with eating and good nutrition and is lost another couple of pounds. Smoking no issues with continuing to smoke patient is not going to quit smoking and will continue the expense.  Relevant past medical, surgical, family and social history reviewed and updated as indicated. Interim medical history since our last visit reviewed. Allergies and medications reviewed and updated.  Other than noted Review of Systems  Constitutional: Negative.   HENT: Negative.   Eyes: Negative.   Respiratory: Negative.   Cardiovascular: Negative.   Gastrointestinal: Negative.   Endocrine: Negative.   Genitourinary: Negative.   Musculoskeletal: Negative.   Skin: Negative.   Allergic/Immunologic: Negative.   Neurological: Negative.   Hematological: Negative.   Psychiatric/Behavioral: Negative.     Per HPI unless specifically indicated above     Objective:    BP (!) 83/44 (BP Location: Left Arm, Patient Position: Sitting, Cuff Size: Normal)   Pulse 87   Temp 97.5 F (36.4 C)   Ht 5' 8.5" (1.74 m)   Wt 114 lb 6.4 oz (51.9 kg)   SpO2 95%   BMI 17.14 kg/m   Wt Readings from Last 3 Encounters:  10/06/16 114 lb 6.4 oz (51.9 kg)  08/06/16 113 lb 12.8 oz  (51.6 kg)  07/09/16 115 lb (52.2 kg)    Physical Exam  Constitutional: He is oriented to person, place, and time. No distress.  skinny  HENT:  Head: Normocephalic and atraumatic.  Right Ear: Hearing and external ear normal.  Left Ear: Hearing and external ear normal.  Nose: Nose normal.  Eyes: Conjunctivae, EOM and lids are normal. Pupils are equal, round, and reactive to light. Right eye exhibits no discharge. Left eye exhibits no discharge. No scleral icterus.  Neck: Normal range of motion. Neck supple.  Cardiovascular: Normal rate, regular rhythm, normal heart sounds and intact distal pulses.   Pulmonary/Chest: Effort normal. No respiratory distress. He has wheezes.  Diminished breath sounds  Abdominal: Soft. Bowel sounds are normal. There is no splenomegaly or hepatomegaly.  Genitourinary: Rectum normal and penis normal.  Genitourinary Comments: BPH changes  Musculoskeletal: Normal range of motion.  Neurological: He is alert and oriented to person, place, and time. He has normal reflexes.  Skin: Skin is intact. No rash noted. No erythema.  Psychiatric: He has a normal mood and affect. His speech is normal and behavior is normal. Judgment and thought content normal. Cognition and memory are normal.   discuss getting a PSA and patient does not want PSA measured. Discussed doesn't want colonoscopy either.  Results for orders placed or performed in visit on 07/09/16  Bayer [facility]  Hb A1c Waived  Result Value Ref Range   Bayer DCA Hb A1c Waived 9.5 (H) <7.0 %  Microalbumin, Urine Waived  Result Value Ref Range   Microalb, Ur Waived 10 0 - 19 mg/L   Creatinine, Urine Waived 50 10 - 300 mg/dL   Microalb/Creat Ratio 30-300 (H) <30 mg/g      Assessment & Plan:   Problem List Items Addressed This Visit      Respiratory   COPD (chronic obstructive pulmonary disease) ([place])    The current medical regimen is effective;  continue present plan and medications.         Endocrine    Diabetes mellitus without complication ([place]) - Primary    Discussed need for better control better nutrition will continue current medications.      Relevant Orders   Bayer DCA Hb A1c Waived   Comprehensive metabolic panel   CBC with Differential/Platelet   TSH   Urinalysis, Routine w reflex microscopic     Genitourinary   BPH (benign prostatic hyperplasia)    Stable for now        Other   Hyperlipidemia    The current medical regimen is effective;  continue present plan and medications.       Relevant Orders   Lipid panel   Malnutrition ([place])    Discuss eating and nutrition importance for diabetes control.      Relevant Orders   Comprehensive metabolic panel   CBC with Differential/Platelet   TSH   Urinalysis, Routine w reflex microscopic    Other Visit Diagnoses    Need for hepatitis C screening test       Relevant Orders   Hepatitis C Antibody   Need for Tdap vaccination       Relevant Orders   Tdap vaccine greater than or equal to 7yo IM (Completed)   PE (physical exam), annual       Relevant Orders   Comprehensive metabolic panel   Lipid panel   CBC with Differential/Platelet   TSH   Urinalysis, Routine w reflex microscopic       Follow up plan: Return for Hemoglobin A1c.

## 2016-10-06 NOTE — Assessment & Plan Note (Signed)
Stable for now

## 2016-10-06 NOTE — Assessment & Plan Note (Signed)
The current medical regimen is effective;  continue present plan and medications.  

## 2016-10-06 NOTE — Assessment & Plan Note (Signed)
Discussed need for better control better nutrition will continue current medications.

## 2016-10-07 ENCOUNTER — Telehealth: Payer: Self-pay | Admitting: Family Medicine

## 2016-10-07 DIAGNOSIS — E7800 Pure hypercholesterolemia, unspecified: Secondary | ICD-10-CM

## 2016-10-07 LAB — COMPREHENSIVE METABOLIC PANEL
ALT: 60 IU/L — ABNORMAL HIGH (ref 0–44)
AST: 45 IU/L — ABNORMAL HIGH (ref 0–40)
Albumin/Globulin Ratio: 1.9 (ref 1.2–2.2)
Albumin: 4.3 g/dL (ref 3.5–4.8)
Alkaline Phosphatase: 75 IU/L (ref 39–117)
BUN/Creatinine Ratio: 26 — ABNORMAL HIGH (ref 10–24)
BUN: 20 mg/dL (ref 8–27)
Bilirubin Total: 0.4 mg/dL (ref 0.0–1.2)
CO2: 25 mmol/L (ref 18–29)
Calcium: 9.7 mg/dL (ref 8.6–10.2)
Chloride: 99 mmol/L (ref 96–106)
Creatinine, Ser: 0.76 mg/dL (ref 0.76–1.27)
GFR calc Af Amer: 106 mL/min/{1.73_m2} (ref >59)
GFR calc non Af Amer: 92 mL/min/{1.73_m2} (ref >59)
Globulin, Total: 2.3 g/dL (ref 1.5–4.5)
Glucose: 96 mg/dL (ref 65–99)
Potassium: 4.7 mmol/L (ref 3.5–5.2)
Sodium: 141 mmol/L (ref 134–144)
Total Protein: 6.6 g/dL (ref 6.0–8.5)

## 2016-10-07 LAB — CBC WITH DIFFERENTIAL/PLATELET
Basophils Absolute: 0.1 10*3/uL (ref 0.0–0.2)
Basos: 1 %
EOS (ABSOLUTE): 0.1 10*3/uL (ref 0.0–0.4)
Eos: 1 %
Hematocrit: 46.4 % (ref 37.5–51.0)
Hemoglobin: 16.4 g/dL (ref 13.0–17.7)
Immature Grans (Abs): 0 10*3/uL (ref 0.0–0.1)
Immature Granulocytes: 0 %
Lymphocytes Absolute: 2.2 10*3/uL (ref 0.7–3.1)
Lymphs: 38 %
MCH: 33.7 pg — ABNORMAL HIGH (ref 26.6–33.0)
MCHC: 35.3 g/dL (ref 31.5–35.7)
MCV: 95 fL (ref 79–97)
Monocytes Absolute: 0.5 10*3/uL (ref 0.1–0.9)
Monocytes: 9 %
Neutrophils Absolute: 3 10*3/uL (ref 1.4–7.0)
Neutrophils: 51 %
Platelets: 265 10*3/uL (ref 150–379)
RBC: 4.87 x10E6/uL (ref 4.14–5.80)
RDW: 14.1 % (ref 12.3–15.4)
WBC: 5.8 10*3/uL (ref 3.4–10.8)

## 2016-10-07 LAB — LIPID PANEL
Chol/HDL Ratio: 2.1 ratio units (ref 0.0–5.0)
Cholesterol, Total: 94 mg/dL — ABNORMAL LOW (ref 100–199)
HDL: 45 mg/dL (ref >39)
LDL Calculated: 33 mg/dL (ref 0–99)
Triglycerides: 80 mg/dL (ref 0–149)
VLDL Cholesterol Cal: 16 mg/dL (ref 5–40)

## 2016-10-07 LAB — TSH: TSH: 1.27 u[IU]/mL (ref 0.450–4.500)

## 2016-10-07 LAB — HEPATITIS C ANTIBODY: Hep C Virus Ab: <0.1 s/co ratio (ref 0.0–0.9)

## 2016-10-07 NOTE — Telephone Encounter (Signed)
Phone call Discussed with patient slight elevation in liver enzymes not taking extra Tylenol or alcohol. Patient is taking extra iron. Will stop iron and will recheck ALT, AST and lipids next office visit.

## 2016-11-03 ENCOUNTER — Ambulatory Visit: Payer: Medicare Other | Admitting: Family Medicine

## 2016-11-03 ENCOUNTER — Encounter: Payer: Medicare Other | Admitting: Family Medicine

## 2016-12-19 ENCOUNTER — Other Ambulatory Visit: Payer: Self-pay | Admitting: Family Medicine

## 2016-12-25 ENCOUNTER — Ambulatory Visit (INDEPENDENT_AMBULATORY_CARE_PROVIDER_SITE_OTHER): Payer: Medicare Other | Admitting: Family Medicine

## 2016-12-25 ENCOUNTER — Encounter: Payer: Self-pay | Admitting: Family Medicine

## 2016-12-25 VITALS — BP 109/65 | HR 86 | Ht 69.0 in | Wt 118.0 lb

## 2016-12-25 DIAGNOSIS — E119 Type 2 diabetes mellitus without complications: Secondary | ICD-10-CM | POA: Diagnosis not present

## 2016-12-25 DIAGNOSIS — Z79899 Other long term (current) drug therapy: Secondary | ICD-10-CM | POA: Diagnosis not present

## 2016-12-25 DIAGNOSIS — E440 Moderate protein-calorie malnutrition: Secondary | ICD-10-CM | POA: Diagnosis not present

## 2016-12-25 DIAGNOSIS — E7800 Pure hypercholesterolemia, unspecified: Secondary | ICD-10-CM | POA: Diagnosis not present

## 2016-12-25 LAB — LP+ALT+AST PICCOLO, WAIVED
ALT (SGPT) Piccolo, Waived: 35 U/L (ref 10–47)
AST (SGOT) Piccolo, Waived: 35 U/L (ref 11–38)
Chol/HDL Ratio Piccolo,Waive: 2 mg/dL
Cholesterol Piccolo, Waived: 93 mg/dL (ref <200)
HDL Chol Piccolo, Waived: 46 mg/dL — ABNORMAL LOW (ref >59)
LDL Chol Calc Piccolo Waived: 31 mg/dL (ref <100)
Triglycerides Piccolo,Waived: 82 mg/dL (ref <150)
VLDL Chol Calc Piccolo,Waive: 16 mg/dL (ref <30)

## 2016-12-25 LAB — BAYER DCA HB A1C WAIVED: HB A1C (BAYER DCA - WAIVED): 8.5 % — ABNORMAL HIGH (ref <7.0)

## 2016-12-25 NOTE — Assessment & Plan Note (Signed)
Worsening control of diabetes with good complaints medications will refer to endocrinology for further evaluation treatment adjusting and change in medications as indicated.

## 2016-12-25 NOTE — Assessment & Plan Note (Signed)
The current medical regimen is effective;  continue present plan and medications.  

## 2016-12-25 NOTE — Assessment & Plan Note (Signed)
Patient will continue working with diet and nutrition to eat healthy and gained a few pounds.

## 2016-12-25 NOTE — Progress Notes (Signed)
BP 109/65   Pulse 86   Ht '5\' 9"'$  (1.753 m)   Wt 118 lb (53.5 kg)   SpO2 98%   BMI 17.43 kg/m    Subjective:    Patient ID: Colin Novak, male    DOB: 04/10/1946, 71 y.o.   MRN: [id number]  HPI: Colin Novak is a 71 y.o. male  Recheck diabetes patient's eating maybe a little bit better taking medications faithfully no low blood sugar spells but still having a hard time concerned about [employer] and amputation advertisements. Cholesterol doing okay no issues.  Relevant past medical, surgical, family and social history reviewed and updated as indicated. Interim medical history since our last visit reviewed. Allergies and medications reviewed and updated.  Review of Systems  Constitutional: Negative.   Respiratory: Negative.   Cardiovascular: Negative.     Per HPI unless specifically indicated above     Objective:    BP 109/65   Pulse 86   Ht '5\' 9"'$  (1.753 m)   Wt 118 lb (53.5 kg)   SpO2 98%   BMI 17.43 kg/m   Wt Readings from Last 3 Encounters:  12/25/16 118 lb (53.5 kg)  10/06/16 114 lb 6.4 oz (51.9 kg)  08/06/16 113 lb 12.8 oz (51.6 kg)    Physical Exam  Constitutional: He is oriented to person, place, and time. He appears well-developed and well-nourished.  HENT:  Head: Normocephalic and atraumatic.  Eyes: Conjunctivae and EOM are normal.  Neck: Normal range of motion.  Cardiovascular: Normal rate, regular rhythm and normal heart sounds.   Pulmonary/Chest: Effort normal and breath sounds normal.  Musculoskeletal: Normal range of motion.  Neurological: He is alert and oriented to person, place, and time.  Skin: No erythema.  Psychiatric: He has a normal mood and affect. His behavior is normal. Judgment and thought content normal.    Results for orders placed or performed in visit on 10/06/16  Microscopic Examination  Result Value Ref Range   WBC, UA 0-5 0 - 5 /hpf   RBC, UA None seen 0 - 2 /hpf   Epithelial Cells (non renal) 0-10 0 - 10 /hpf   Bacteria, UA None seen None seen/Few  Bayer DCA Hb A1c Waived  Result Value Ref Range   Bayer DCA Hb A1c Waived 8.3 (H) <7.0 %  Hepatitis C Antibody  Result Value Ref Range   Hep C Virus Ab <0.1 0.0 - 0.9 s/co ratio  Comprehensive metabolic panel  Result Value Ref Range   Glucose 96 65 - 99 mg/dL   BUN 20 8 - 27 mg/dL   Creatinine, Ser 0.76 0.76 - 1.27 mg/dL   GFR calc non Af Amer 92 >59 mL/min/1.73   GFR calc Af Amer 106 >59 mL/min/1.73   BUN/Creatinine Ratio 26 (H) 10 - 24   Sodium 141 134 - 144 mmol/L   Potassium 4.7 3.5 - 5.2 mmol/L   Chloride 99 96 - 106 mmol/L   CO2 25 18 - 29 mmol/L   Calcium 9.7 8.6 - 10.2 mg/dL   Total Protein 6.6 6.0 - 8.5 g/dL   Albumin 4.3 3.5 - 4.8 g/dL   Globulin, Total 2.3 1.5 - 4.5 g/dL   Albumin/Globulin Ratio 1.9 1.2 - 2.2   Bilirubin Total 0.4 0.0 - 1.2 mg/dL   Alkaline Phosphatase 75 39 - 117 IU/L   AST 45 (H) 0 - 40 IU/L   ALT 60 (H) 0 - 44 IU/L  Lipid panel  Result Value Ref Range  Cholesterol, Total 94 (L) 100 - 199 mg/dL   Triglycerides 80 0 - 149 mg/dL   HDL 45 >39 mg/dL   VLDL Cholesterol Cal 16 5 - 40 mg/dL   LDL Calculated 33 0 - 99 mg/dL   Chol/HDL Ratio 2.1 0.0 - 5.0 ratio units  CBC with Differential/Platelet  Result Value Ref Range   WBC 5.8 3.4 - 10.8 x10E3/uL   RBC 4.87 4.14 - 5.80 x10E6/uL   Hemoglobin 16.4 13.0 - 17.7 g/dL   Hematocrit 46.4 37.5 - 51.0 %   MCV 95 79 - 97 fL   MCH 33.7 (H) 26.6 - 33.0 pg   MCHC 35.3 31.5 - 35.7 g/dL   RDW 14.1 12.3 - 15.4 %   Platelets 265 150 - 379 x10E3/uL   Neutrophils 51 Not Estab. %   Lymphs 38 Not Estab. %   Monocytes 9 Not Estab. %   Eos 1 Not Estab. %   Basos 1 Not Estab. %   Neutrophils Absolute 3.0 1.4 - 7.0 x10E3/uL   Lymphocytes Absolute 2.2 0.7 - 3.1 x10E3/uL   Monocytes Absolute 0.5 0.1 - 0.9 x10E3/uL   EOS (ABSOLUTE) 0.1 0.0 - 0.4 x10E3/uL   Basophils Absolute 0.1 0.0 - 0.2 x10E3/uL   Immature Granulocytes 0 Not Estab. %   Immature Grans (Abs) 0.0 0.0 - 0.1  x10E3/uL  TSH  Result Value Ref Range   TSH 1.270 0.450 - 4.500 uIU/mL  Urinalysis, Routine w reflex microscopic  Result Value Ref Range   Specific Gravity, UA 1.010 1.005 - 1.030   pH, UA 6.0 5.0 - 7.5   Color, UA Yellow Yellow   Appearance Ur Clear Clear   Leukocytes, UA Negative Negative   Protein, UA Negative Negative/Trace   Glucose, UA 3+ (A) Negative   Ketones, UA Negative Negative   RBC, UA Trace (A) Negative   Bilirubin, UA Negative Negative   Urobilinogen, Ur 0.2 0.2 - 1.0 mg/dL   Nitrite, UA Negative Negative   Microscopic Examination See below:       Assessment & Plan:   Problem List Items Addressed This Visit      Endocrine   Diabetes mellitus without complication ([place]) - Primary    Worsening control of diabetes with good complaints medications will refer to endocrinology for further evaluation treatment adjusting and change in medications as indicated.      Relevant Orders   Bayer [place] Hb A1c Waived   LP+ALT+AST [place], [place]   Ambulatory referral to Endocrinology     Other   Hyperlipidemia    The current medical regimen is effective;  continue present plan and medications.       Relevant Orders   Bayer DCA Hb A1c Waived   LP+ALT+AST [place], Waived   Malnutrition [facility])    Patient will continue working with diet and nutrition to eat healthy and gained a few pounds.       Other Visit Diagnoses    Medication management       Relevant Orders   Bayer DCA Hb A1c Waived   LP+ALT+AST [place], Waived     Extensive discussion of advanced care planning was held. This included the explanation and discussion of advanced directives such a standard forms which were given to the patient. He will complete these with his daughter.  Follow up plan: Return for June, BMP,  Lipids, ALT, AST.

## 2017-01-29 DIAGNOSIS — E1165 Type 2 diabetes mellitus with hyperglycemia: Secondary | ICD-10-CM | POA: Diagnosis not present

## 2017-01-29 DIAGNOSIS — Z794 Long term (current) use of insulin: Secondary | ICD-10-CM | POA: Insufficient documentation

## 2017-01-29 DIAGNOSIS — F17200 Nicotine dependence, unspecified, uncomplicated: Secondary | ICD-10-CM | POA: Diagnosis not present

## 2017-02-11 ENCOUNTER — Other Ambulatory Visit: Payer: Self-pay | Admitting: Family Medicine

## 2017-02-11 NOTE — Telephone Encounter (Signed)
Last OV: 12/25/16 Next OV: 04/01/17  BMP Latest Ref Rng & Units 10/06/2016 12/05/2015 07/24/2015  Glucose 65 - 99 mg/dL 96 98 195(H)  BUN 8 - 27 mg/dL '20 19 14  '$ Creatinine 0.76 - 1.27 mg/dL 0.76 0.75(L) 0.71(L)  BUN/Creat Ratio 10 - 24 26(H) 25(H) 20  Sodium 134 - 144 mmol/L 141 140 140  Potassium 3.5 - 5.2 mmol/L 4.7 4.5 4.9  Chloride 96 - 106 mmol/L 99 99 99  CO2 18 - 29 mmol/L '25 24 25  '$ Calcium 8.6 - 10.2 mg/dL 9.7 9.6 9.3

## 2017-04-01 ENCOUNTER — Encounter: Payer: Self-pay | Admitting: Family Medicine

## 2017-04-01 ENCOUNTER — Ambulatory Visit (INDEPENDENT_AMBULATORY_CARE_PROVIDER_SITE_OTHER): Payer: Medicare Other | Admitting: Family Medicine

## 2017-04-01 VITALS — BP 100/59 | HR 76 | Wt 115.0 lb

## 2017-04-01 DIAGNOSIS — E440 Moderate protein-calorie malnutrition: Secondary | ICD-10-CM | POA: Diagnosis not present

## 2017-04-01 DIAGNOSIS — E119 Type 2 diabetes mellitus without complications: Secondary | ICD-10-CM

## 2017-04-01 DIAGNOSIS — E7800 Pure hypercholesterolemia, unspecified: Secondary | ICD-10-CM | POA: Diagnosis not present

## 2017-04-01 DIAGNOSIS — D692 Other nonthrombocytopenic purpura: Secondary | ICD-10-CM

## 2017-04-01 LAB — LP+ALT+AST PICCOLO, WAIVED
ALT (SGPT) Piccolo, Waived: 48 U/L — ABNORMAL HIGH (ref 10–47)
AST (SGOT) Piccolo, Waived: 55 U/L — ABNORMAL HIGH (ref 11–38)
Chol/HDL Ratio Piccolo,Waive: 1.9 mg/dL
Cholesterol Piccolo, Waived: 96 mg/dL (ref <200)
HDL Chol Piccolo, Waived: 50 mg/dL — ABNORMAL LOW (ref >59)
LDL Chol Calc Piccolo Waived: 30 mg/dL (ref <100)
Triglycerides Piccolo,Waived: 77 mg/dL (ref <150)
VLDL Chol Calc Piccolo,Waive: 15 mg/dL (ref <30)

## 2017-04-01 LAB — BAYER DCA HB A1C WAIVED: HB A1C (BAYER DCA - WAIVED): 8.2 % — ABNORMAL HIGH (ref <7.0)

## 2017-04-01 NOTE — Assessment & Plan Note (Signed)
Followed by endocrinology and getting better with lowering A1c today.

## 2017-04-01 NOTE — Assessment & Plan Note (Signed)
Weight stable no real weight loss Discussed diet nutrition

## 2017-04-01 NOTE — Assessment & Plan Note (Signed)
The current medical regimen is effective;  continue present plan and medications.  

## 2017-04-01 NOTE — Assessment & Plan Note (Signed)
Patient with chronic bruising on arms not getting worse.

## 2017-04-01 NOTE — Progress Notes (Signed)
BP (!) 100/59   Pulse 76   Wt 115 lb (52.2 kg)   SpO2 95%   BMI 16.98 kg/m    Subjective:    Patient ID: Colin Novak, male    DOB: [birth year], 71 y.o.   MRN: [id number]  HPI: Colin Novak is a 71 y.o. male  Chief Complaint  Patient presents with  . Follow-up   Patient with diabetes being followed by endocrinology at [facility] clinic and doing better increase metformin patient's doing better with adjustments in his insulin and eating healthier. As a result patient's A1c is lowest it's been in 2 years. Patient with no hypoglycemia episodes.  Relevant past medical, surgical, family and social history reviewed and updated as indicated. Interim medical history since our last visit reviewed. Allergies and medications reviewed and updated.  Review of Systems  Constitutional: Negative.   Respiratory: Negative.   Cardiovascular: Negative.     Per HPI unless specifically indicated above     Objective:    BP (!) 100/59   Pulse 76   Wt 115 lb (52.2 kg)   SpO2 95%   BMI 16.98 kg/m   Wt Readings from Last 3 Encounters:  04/01/17 115 lb (52.2 kg)  12/25/16 118 lb (53.5 kg)  10/06/16 114 lb 6.4 oz (51.9 kg)    Physical Exam  Constitutional: He is oriented to person, place, and time. He appears well-developed and well-nourished.  HENT:  Head: Normocephalic and atraumatic.  Eyes: Conjunctivae and EOM are normal.  Neck: Normal range of motion.  Cardiovascular: Normal rate, regular rhythm and normal heart sounds.   Pulmonary/Chest: Effort normal and breath sounds normal.  Musculoskeletal: Normal range of motion.  Neurological: He is alert and oriented to person, place, and time.  Skin: No erythema.  Psychiatric: He has a normal mood and affect. His behavior is normal. Judgment and thought content normal.    Results for orders placed or performed in visit on 12/25/16  Bayer DCA Hb A1c Waived  Result Value Ref Range   Bayer DCA Hb A1c Waived 8.5 (H) <7.0 %  LP+ALT+AST  [place], Waived  Result Value Ref Range   ALT (SGPT) [place], Waived 35 10 - 47 U/L   AST (SGOT) Piccolo, Waived 35 11 - 38 U/L   Cholesterol Piccolo, Waived 93 <200 mg/dL   HDL Chol [place], Waived 46 (L) >59 mg/dL   Triglycerides Piccolo,Waived 82 <150 mg/dL   Chol/HDL Ratio Piccolo,Waive 2.0 mg/dL   LDL Chol Calc [place] Waived 31 <100 mg/dL   VLDL Chol Calc Piccolo,Waive 16 <30 mg/dL      Assessment & Plan:   Problem List Items Addressed This Visit      Cardiovascular and Mediastinum   Senile purpura (HCC)    Patient with chronic bruising on arms not getting worse.        Endocrine   Diabetes mellitus without complication ([place]) - Primary    Followed by endocrinology and getting better with lowering A1c today.      Relevant Orders   Basic metabolic panel   LP+ALT+AST [place], Waived   Bayer DCA Hb A1c Waived     Other   Hyperlipidemia    The current medical regimen is effective;  continue present plan and medications.       Relevant Orders   Basic metabolic panel   LP+ALT+AST [place], Waived   Bayer DCA Hb A1c Waived   Malnutrition ([place])    Weight stable no real weight loss Discussed diet  nutrition          Follow up plan: Return in about 6 months (around 10/01/2017) for Physical Exam.

## 2017-04-02 ENCOUNTER — Encounter: Payer: Self-pay | Admitting: Family Medicine

## 2017-04-02 LAB — BASIC METABOLIC PANEL
BUN/Creatinine Ratio: 22 (ref 10–24)
BUN: 15 mg/dL (ref 8–27)
CO2: 27 mmol/L (ref 18–29)
Calcium: 9.9 mg/dL (ref 8.6–10.2)
Chloride: 100 mmol/L (ref 96–106)
Creatinine, Ser: 0.68 mg/dL — ABNORMAL LOW (ref 0.76–1.27)
GFR calc Af Amer: 110 mL/min/{1.73_m2} (ref >59)
GFR calc non Af Amer: 95 mL/min/{1.73_m2} (ref >59)
Glucose: 128 mg/dL — ABNORMAL HIGH (ref 65–99)
Potassium: 4 mmol/L (ref 3.5–5.2)
Sodium: 142 mmol/L (ref 134–144)

## 2017-04-27 ENCOUNTER — Other Ambulatory Visit: Payer: Self-pay | Admitting: Family Medicine

## 2017-04-27 NOTE — Telephone Encounter (Signed)
Last OV: 04/01/17 Next OV: 10/05/17   No results found for: HGBA1C

## 2017-05-01 DIAGNOSIS — Z794 Long term (current) use of insulin: Secondary | ICD-10-CM | POA: Diagnosis not present

## 2017-05-01 DIAGNOSIS — R636 Underweight: Secondary | ICD-10-CM | POA: Diagnosis not present

## 2017-05-01 DIAGNOSIS — F17200 Nicotine dependence, unspecified, uncomplicated: Secondary | ICD-10-CM | POA: Diagnosis not present

## 2017-05-01 DIAGNOSIS — E1165 Type 2 diabetes mellitus with hyperglycemia: Secondary | ICD-10-CM | POA: Diagnosis not present

## 2017-08-11 DIAGNOSIS — F17200 Nicotine dependence, unspecified, uncomplicated: Secondary | ICD-10-CM | POA: Diagnosis not present

## 2017-08-11 DIAGNOSIS — E1165 Type 2 diabetes mellitus with hyperglycemia: Secondary | ICD-10-CM | POA: Diagnosis not present

## 2017-08-11 DIAGNOSIS — Z794 Long term (current) use of insulin: Secondary | ICD-10-CM | POA: Diagnosis not present

## 2017-09-19 ENCOUNTER — Other Ambulatory Visit: Payer: Self-pay | Admitting: Family Medicine

## 2017-10-05 ENCOUNTER — Encounter: Payer: Medicare Other | Admitting: Family Medicine

## 2017-10-29 ENCOUNTER — Encounter: Payer: Medicare Other | Admitting: Family Medicine

## 2017-11-12 DIAGNOSIS — E1165 Type 2 diabetes mellitus with hyperglycemia: Secondary | ICD-10-CM | POA: Diagnosis not present

## 2017-11-12 DIAGNOSIS — Z794 Long term (current) use of insulin: Secondary | ICD-10-CM | POA: Diagnosis not present

## 2017-11-19 ENCOUNTER — Ambulatory Visit: Payer: Medicare Other | Admitting: Family Medicine

## 2017-11-19 ENCOUNTER — Ambulatory Visit (INDEPENDENT_AMBULATORY_CARE_PROVIDER_SITE_OTHER): Payer: Medicare Other

## 2017-11-19 VITALS — BP 116/72 | HR 92 | Temp 98.3°F | Resp 16 | Ht 68.5 in | Wt 125.8 lb

## 2017-11-19 VITALS — BP 116/72 | HR 75 | Ht 68.5 in | Wt 125.8 lb

## 2017-11-19 DIAGNOSIS — E7800 Pure hypercholesterolemia, unspecified: Secondary | ICD-10-CM | POA: Diagnosis not present

## 2017-11-19 DIAGNOSIS — Z0000 Encounter for general adult medical examination without abnormal findings: Secondary | ICD-10-CM

## 2017-11-19 DIAGNOSIS — R600 Localized edema: Secondary | ICD-10-CM | POA: Diagnosis not present

## 2017-11-19 DIAGNOSIS — Z0001 Encounter for general adult medical examination with abnormal findings: Secondary | ICD-10-CM

## 2017-11-19 DIAGNOSIS — F321 Major depressive disorder, single episode, moderate: Secondary | ICD-10-CM | POA: Insufficient documentation

## 2017-11-19 DIAGNOSIS — E11649 Type 2 diabetes mellitus with hypoglycemia without coma: Secondary | ICD-10-CM | POA: Diagnosis not present

## 2017-11-19 DIAGNOSIS — N401 Enlarged prostate with lower urinary tract symptoms: Secondary | ICD-10-CM | POA: Diagnosis not present

## 2017-11-19 DIAGNOSIS — Z794 Long term (current) use of insulin: Secondary | ICD-10-CM | POA: Diagnosis not present

## 2017-11-19 DIAGNOSIS — F17200 Nicotine dependence, unspecified, uncomplicated: Secondary | ICD-10-CM | POA: Diagnosis not present

## 2017-11-19 DIAGNOSIS — E440 Moderate protein-calorie malnutrition: Secondary | ICD-10-CM | POA: Diagnosis not present

## 2017-11-19 DIAGNOSIS — Z23 Encounter for immunization: Secondary | ICD-10-CM | POA: Diagnosis not present

## 2017-11-19 DIAGNOSIS — Z7189 Other specified counseling: Secondary | ICD-10-CM | POA: Insufficient documentation

## 2017-11-19 DIAGNOSIS — D692 Other nonthrombocytopenic purpura: Secondary | ICD-10-CM | POA: Diagnosis not present

## 2017-11-19 DIAGNOSIS — R350 Frequency of micturition: Secondary | ICD-10-CM | POA: Diagnosis not present

## 2017-11-19 DIAGNOSIS — J42 Unspecified chronic bronchitis: Secondary | ICD-10-CM | POA: Diagnosis not present

## 2017-11-19 DIAGNOSIS — E119 Type 2 diabetes mellitus without complications: Secondary | ICD-10-CM | POA: Diagnosis not present

## 2017-11-19 DIAGNOSIS — E1165 Type 2 diabetes mellitus with hyperglycemia: Secondary | ICD-10-CM | POA: Diagnosis not present

## 2017-11-19 MED ORDER — EZETIMIBE-SIMVASTATIN 10-40 MG PO TABS: 1.0000 | ORAL_TABLET | Freq: Every day | ORAL | 4 refills | Status: DC

## 2017-11-19 MED ORDER — CITALOPRAM HYDROBROMIDE 20 MG PO TABS
20.0000 mg | ORAL_TABLET | Freq: Every day | ORAL | 1 refills | Status: DC
Start: 2017-11-19 — End: 2017-12-07

## 2017-11-19 NOTE — Assessment & Plan Note (Signed)
Stable

## 2017-11-19 NOTE — Patient Instructions (Addendum)
Colin Novak , Thank you for taking time to come for your Medicare Wellness Visit. I appreciate your ongoing commitment to your health goals. Please review the following plan we discussed and let me know if I can assist you in the future.   Screening recommendations/referrals: Colonoscopy: declined Recommended yearly ophthalmology/optometry visit for glaucoma screening and checkup Recommended yearly dental visit for hygiene and checkup  Vaccinations: Influenza vaccine: done today Pneumococcal vaccine: up to date Tdap vaccine: up to date Shingles vaccine: up to date  Advanced directives: Advance directive discussed with you today. I have provided a copy for you to complete at home and have notarized. Once this is complete please bring a copy in to our office so we can scan it into your chart.  Conditions/risks identified: Smoking cessation discussed  Next appointment: Follow up in one year for your annual wellness exam.   Preventive Care 73 Years and Older, Male Preventive care refers to lifestyle choices and visits with your health care provider that can promote health and wellness. What does preventive care include?  A yearly physical exam. This is also called an annual well check.  Dental exams once or twice a year.  Routine eye exams. Ask your health care provider how often you should have your eyes checked.  Personal lifestyle choices, including:  Daily care of your teeth and gums.  Regular physical activity.  Eating a healthy diet.  Avoiding tobacco and drug use.  Limiting alcohol use.  Practicing safe sex.  Taking low doses of aspirin every day.  Taking vitamin and mineral supplements as recommended by your health care provider. What happens during an annual well check? The services and screenings done by your health care provider during your annual well check will depend on your age, overall health, lifestyle risk factors, and family history of disease. Counseling   Your health care provider may ask you questions about your:  Alcohol use.  Tobacco use.  Drug use.  Emotional well-being.  Home and relationship well-being.  Sexual activity.  Eating habits.  History of falls.  Memory and ability to understand (cognition).  Work and work Statistician. Screening  You may have the following tests or measurements:  Height, weight, and BMI.  Blood pressure.  Lipid and cholesterol levels. These may be checked every 5 years, or more frequently if you are over 73 years old.  Skin check.  Lung cancer screening. You may have this screening every year starting at age 73 if you have a 30-pack-year history of smoking and currently smoke or have quit within the past 15 years.  Fecal occult blood test (FOBT) of the stool. You may have this test every year starting at age 73.  Flexible sigmoidoscopy or colonoscopy. You may have a sigmoidoscopy every 5 years or a colonoscopy every 10 years starting at age 73.  Prostate cancer screening. Recommendations will vary depending on your family history and other risks.  Hepatitis C blood test.  Hepatitis B blood test.  Sexually transmitted disease (STD) testing.  Diabetes screening. This is done by checking your blood sugar (glucose) after you have not eaten for a while (fasting). You may have this done every 1-3 years.  Abdominal aortic aneurysm (AAA) screening. You may need this if you are a current or former smoker.  Osteoporosis. You may be screened starting at age 73 if you are at high risk. Talk with your health care provider about your test results, treatment options, and if necessary, the need for more tests.  Vaccines  Your health care provider may recommend certain vaccines, such as:  Influenza vaccine. This is recommended every year.  Tetanus, diphtheria, and acellular pertussis (Tdap, Td) vaccine. You may need a Td booster every 10 years.  Zoster vaccine. You may need this after age  73.  Pneumococcal 13-valent conjugate (PCV13) vaccine. One dose is recommended after age 73.  Pneumococcal polysaccharide (PPSV23) vaccine. One dose is recommended after age 73. Talk to your health care provider about which screenings and vaccines you need and how often you need them. This information is not intended to replace advice given to you by your health care provider. Make sure you discuss any questions you have with your health care provider. Document Released: 11/09/2015 Document Revised: 07/02/2016 Document Reviewed: 08/14/2015 Elsevier Interactive Patient Education  2017 [place] Prevention in the Home Falls can cause injuries. They can happen to people of all ages. There are many things you can do to make your home safe and to help prevent falls. What can I do on the outside of my home?  Regularly fix the edges of walkways and driveways and fix any cracks.  Remove anything that might make you trip as you walk through a door, such as a raised step or threshold.  Trim any bushes or trees on the path to your home.  Use bright outdoor lighting.  Clear any walking paths of anything that might make someone trip, such as rocks or tools.  Regularly check to see if handrails are loose or broken. Make sure that both sides of any steps have handrails.  Any raised decks and porches should have guardrails on the edges.  Have any leaves, snow, or ice cleared regularly.  Use sand or salt on walking paths during winter.  Clean up any spills in your garage right away. This includes oil or grease spills. What can I do in the bathroom?  Use night lights.  Install grab bars by the toilet and in the tub and shower. Do not use towel bars as grab bars.  Use non-skid mats or decals in the tub or shower.  If you need to sit down in the shower, use a plastic, non-slip stool.  Keep the floor dry. Clean up any water that spills on the floor as soon as it happens.  Remove  soap buildup in the tub or shower regularly.  Attach bath mats securely with double-sided non-slip rug tape.  Do not have throw rugs and other things on the floor that can make you trip. What can I do in the bedroom?  Use night lights.  Make sure that you have a light by your bed that is easy to reach.  Do not use any sheets or blankets that are too big for your bed. They should not hang down onto the floor.  Have a firm chair that has side arms. You can use this for support while you get dressed.  Do not have throw rugs and other things on the floor that can make you trip. What can I do in the kitchen?  Clean up any spills right away.  Avoid walking on wet floors.  Keep items that you use a lot in easy-to-reach places.  If you need to reach something above you, use a strong step stool that has a grab bar.  Keep electrical cords out of the way.  Do not use floor polish or wax that makes floors slippery. If you must use wax, use non-skid floor wax.  Do not have throw rugs and other things on the floor that can make you trip. What can I do with my stairs?  Do not leave any items on the stairs.  Make sure that there are handrails on both sides of the stairs and use them. Fix handrails that are broken or loose. Make sure that handrails are as long as the stairways.  Check any carpeting to make sure that it is firmly attached to the stairs. Fix any carpet that is loose or worn.  Avoid having throw rugs at the top or bottom of the stairs. If you do have throw rugs, attach them to the floor with carpet tape.  Make sure that you have a light switch at the top of the stairs and the bottom of the stairs. If you do not have them, ask someone to add them for you. What else can I do to help prevent falls?  Wear shoes that:  Do not have high heels.  Have rubber bottoms.  Are comfortable and fit you well.  Are closed at the toe. Do not wear sandals.  If you use a  stepladder:  Make sure that it is fully opened. Do not climb a closed stepladder.  Make sure that both sides of the stepladder are locked into place.  Ask someone to hold it for you, if possible.  Clearly mark and make sure that you can see:  Any grab bars or handrails.  First and last steps.  Where the edge of each step is.  Use tools that help you move around (mobility aids) if they are needed. These include:  Canes.  Walkers.  Scooters.  Crutches.  Turn on the lights when you go into a dark area. Replace any light bulbs as soon as they burn out.  Set up your furniture so you have a clear path. Avoid moving your furniture around.  If any of your floors are uneven, fix them.  If there are any pets around you, be aware of where they are.  Review your medicines with your doctor. Some medicines can make you feel dizzy. This can increase your chance of falling. Ask your doctor what other things that you can do to help prevent falls. This information is not intended to replace advice given to you by your health care provider. Make sure you discuss any questions you have with your health care provider. Document Released: 08/09/2009 Document Revised: 03/20/2016 Document Reviewed: 11/17/2014 Elsevier Interactive Patient Education  2017 [place].  Influenza (Flu) Vaccine (Inactivated or Recombinant): What You Need to Know 1. Why get vaccinated? Influenza ("flu") is a contagious disease that spreads around the [place] every year, usually between October and May. Flu is caused by influenza viruses, and is spread mainly by coughing, sneezing, and close contact. Anyone can get flu. Flu strikes suddenly and can last several days. Symptoms vary by age, but can include:  fever/chills  sore throat  muscle aches  fatigue  cough  headache  runny or stuffy nose  Flu can also lead to pneumonia and blood infections, and cause diarrhea and seizures in children. If you  have a medical condition, such as heart or lung disease, flu can make it worse. Flu is more dangerous for some people. Infants and young children, people 37 years of age and older, pregnant women, and people with certain health conditions or a weakened immune system are at greatest risk. Each year thousands of people in the [place] die from flu,  and many more are hospitalized. Flu vaccine can:  keep you from getting flu,  make flu less severe if you do get it, and  keep you from spreading flu to your family and other people. 2. Inactivated and recombinant flu vaccines A dose of flu vaccine is recommended every flu season. Children 6 months through 71 years of age may need two doses during the same flu season. Everyone else needs only one dose each flu season. Some inactivated flu vaccines contain a very small amount of a mercury-based preservative called thimerosal. Studies have not shown thimerosal in vaccines to be harmful, but flu vaccines that do not contain thimerosal are available. There is no live flu virus in flu shots. They cannot cause the flu. There are many flu viruses, and they are always changing. Each year a new flu vaccine is made to protect against three or four viruses that are likely to cause disease in the upcoming flu season. But even when the vaccine doesn't exactly match these viruses, it may still provide some protection. Flu vaccine cannot prevent:  flu that is caused by a virus not covered by the vaccine, or  illnesses that look like flu but are not.  It takes about 2 weeks for protection to develop after vaccination, and protection lasts through the flu season. 3. Some people should not get this vaccine Tell the person who is giving you the vaccine:  If you have any severe, life-threatening allergies. If you ever had a life-threatening allergic reaction after a dose of flu vaccine, or have a severe allergy to any part of this vaccine, you may be advised not to  get vaccinated. Most, but not all, types of flu vaccine contain a small amount of egg protein.  If you ever had Guillain-Barr Syndrome (also called GBS). Some people with a history of GBS should not get this vaccine. This should be discussed with your doctor.  If you are not feeling well. It is usually okay to get flu vaccine when you have a mild illness, but you might be asked to come back when you feel better.  4. Risks of a vaccine reaction With any medicine, including vaccines, there is a chance of reactions. These are usually mild and go away on their own, but serious reactions are also possible. Most people who get a flu shot do not have any problems with it. Minor problems following a flu shot include:  soreness, redness, or swelling where the shot was given  hoarseness  sore, red or itchy eyes  cough  fever  aches  headache  itching  fatigue  If these problems occur, they usually begin soon after the shot and last 1 or 2 days. More serious problems following a flu shot can include the following:  There may be a small increased risk of Guillain-Barre Syndrome (GBS) after inactivated flu vaccine. This risk has been estimated at 1 or 2 additional cases per million people vaccinated. This is much lower than the risk of severe complications from flu, which can be prevented by flu vaccine.  Young children who get the flu shot along with pneumococcal vaccine (PCV13) and/or DTaP vaccine at the same time might be slightly more likely to have a seizure caused by fever. Ask your doctor for more information. Tell your doctor if a child who is getting flu vaccine has ever had a seizure.  Problems that could happen after any injected vaccine:  People sometimes faint after a medical procedure, including vaccination. Sitting  or lying down for about 15 minutes can help prevent fainting, and injuries caused by a fall. Tell your doctor if you feel dizzy, or have vision changes or ringing  in the ears.  Some people get severe pain in the shoulder and have difficulty moving the arm where a shot was given. This happens very rarely.  Any medication can cause a severe allergic reaction. Such reactions from a vaccine are very rare, estimated at about 1 in a million doses, and would happen within a few minutes to a few hours after the vaccination. As with any medicine, there is a very remote chance of a vaccine causing a serious injury or death. The safety of vaccines is always being monitored. For more information, visit: http://www.aguilar.org/ 5. What if there is a serious reaction? What should I look for? Look for anything that concerns you, such as signs of a severe allergic reaction, very high fever, or unusual behavior. Signs of a severe allergic reaction can include hives, swelling of the face and throat, difficulty breathing, a fast heartbeat, dizziness, and weakness. These would start a few minutes to a few hours after the vaccination. What should I do?  If you think it is a severe allergic reaction or other emergency that can't wait, call 9-1-1 and get the person to the nearest hospital. Otherwise, call your doctor.  Reactions should be reported to the Vaccine Adverse Event Reporting System (VAERS). Your doctor should file this report, or you can do it yourself through the VAERS web site at www.vaers.SamedayNews.es, or by calling 469 534 9224. ? VAERS does not give medical advice. 6. The National Vaccine Injury Compensation Program The [employer] Vaccine Injury Compensation Program (VICP) is a federal program that was created to compensate people who may have been injured by certain vaccines. Persons who believe they may have been injured by a vaccine can learn about the program and about filing a claim by calling 815-174-7345 or visiting the [place] website at GoldCloset.com.ee. There is a time limit to file a claim for compensation. 7. How can I learn more?  Ask  your healthcare provider. He or she can give you the vaccine package insert or suggest other sources of information.  Call your local or state health department.  Contact the Centers for Disease Control and Prevention (CDC): ? Call 215-447-4101 (1-800-CDC-INFO) or ? Visit CDC's website at https://gibson.com/ Vaccine Information Statement, Inactivated Influenza Vaccine (06/02/2014) This information is not intended to replace advice given to you by your health care provider. Make sure you discuss any questions you have with your health care provider. Document Released: 08/07/2006 Document Revised: 07/03/2016 Document Reviewed: 07/03/2016 Elsevier Interactive Patient Education  2017 [employer].

## 2017-11-19 NOTE — Assessment & Plan Note (Signed)
The current medical regimen is effective;  continue present plan and medications.  

## 2017-11-19 NOTE — Assessment & Plan Note (Signed)
Stable no interested in stopping smoking or tx

## 2017-11-19 NOTE — Assessment & Plan Note (Signed)
A voluntary discussion about advance care planning including the explanation and discussion of advance directives was extensively discussed  with the patient.  Explanation about the health care proxy and Living will was reviewed and packet with forms with explanation of how to fill them out was given.    

## 2017-11-19 NOTE — Assessment & Plan Note (Signed)
Patient noticed to be markedly depressed today.  Seems to be a consequence of weather and health status.  Reviewed care and treatment of depression and will add citalopram 20 mg 1 a day. Will recheck in 2-3 weeks.

## 2017-11-19 NOTE — Progress Notes (Signed)
BP 116/72   Pulse 75   Ht 5' 8.5" (1.74 m)   Wt 125 lb 12.8 oz (57.1 kg)   SpO2 98%   BMI 18.85 kg/m    Subjective:    Patient ID: Colin Novak, male    DOB: [birth year], 72 y.o.   MRN: [id number]  HPI: Colin Novak is a 72 y.o. male  Chief Complaint  Patient presents with  . Annual Exam  Follow-up has appointment with endocrinology later this morning.  Has had a tough time controlling blood sugar. Cholesterol no issues with Vytorin.  No blood pressure issues are renal issues.  Patient unfortunately continues to smoke heavily and mood is flat primarily with the winter weather. Patient also has noticed over the last week or so ankle edema. Relevant past medical, surgical, family and social history reviewed and updated as indicated. Interim medical history since our last visit reviewed. Allergies and medications reviewed and updated.  Review of Systems  Constitutional: Negative.   HENT: Negative.   Eyes: Negative.   Respiratory: Negative.   Cardiovascular: Negative.   Gastrointestinal: Negative.   Endocrine: Negative.   Genitourinary: Negative.   Musculoskeletal: Negative.   Skin: Negative.   Allergic/Immunologic: Negative.   Neurological: Negative.   Hematological: Negative.   Psychiatric/Behavioral: Negative.     Per HPI unless specifically indicated above     Objective:    BP 116/72   Pulse 75   Ht 5' 8.5" (1.74 m)   Wt 125 lb 12.8 oz (57.1 kg)   SpO2 98%   BMI 18.85 kg/m   Wt Readings from Last 3 Encounters:  11/19/17 125 lb 12.8 oz (57.1 kg)  11/19/17 125 lb 12.8 oz (57.1 kg)  04/01/17 115 lb (52.2 kg)    Physical Exam  Constitutional: He is oriented to person, place, and time. He appears well-developed and well-nourished.  HENT:  Head: Normocephalic and atraumatic.  Right Ear: External ear normal.  Left Ear: External ear normal.  Eyes: Conjunctivae and EOM are normal. Pupils are equal, round, and reactive to light.  Neck: Normal range of  motion. Neck supple.  Cardiovascular: Normal rate, regular rhythm, normal heart sounds and intact distal pulses.  Pulmonary/Chest: Effort normal and breath sounds normal.  Abdominal: Soft. Bowel sounds are normal. There is no splenomegaly or hepatomegaly.  Genitourinary: Rectum normal and penis normal.  Genitourinary Comments: BPH changes  Musculoskeletal: Normal range of motion.  2+ankle edema  Neurological: He is alert and oriented to person, place, and time. He has normal reflexes.  Skin: No rash noted. No erythema.  Psychiatric: He has a normal mood and affect. His behavior is normal. Judgment and thought content normal.    Results for orders placed or performed in visit on 42/70/62  Basic metabolic panel  Result Value Ref Range   Glucose 128 (H) 65 - 99 mg/dL   BUN 15 8 - 27 mg/dL   Creatinine, Ser 0.68 (L) 0.76 - 1.27 mg/dL   GFR calc non Af Amer 95 >59 mL/min/1.73   GFR calc Af Amer 110 >59 mL/min/1.73   BUN/Creatinine Ratio 22 10 - 24   Sodium 142 134 - 144 mmol/L   Potassium 4.0 3.5 - 5.2 mmol/L   Chloride 100 96 - 106 mmol/L   CO2 27 18 - 29 mmol/L   Calcium 9.9 8.6 - 10.2 mg/dL  LP+ALT+AST Piccolo, Waived  Result Value Ref Range   ALT (SGPT) Piccolo, Waived 48 (H) 10 - 47 U/L   AST (  SGOT) Piccolo, Waived 55 (H) 11 - 38 U/L   Cholesterol Piccolo, Waived 96 <200 mg/dL   HDL Chol Piccolo, Waived 50 (L) >59 mg/dL   Triglycerides Piccolo,Waived 77 <150 mg/dL   Chol/HDL Ratio Piccolo,Waive 1.9 mg/dL   LDL Chol Calc Piccolo Waived 30 <100 mg/dL   VLDL Chol Calc Piccolo,Waive 15 <30 mg/dL  Bayer DCA Hb A1c Waived  Result Value Ref Range   Bayer DCA Hb A1c Waived 8.2 (H) <7.0 %      Assessment & Plan:   Problem List Items Addressed This Visit      Cardiovascular and Mediastinum   Senile purpura (HCC)    Stable       Relevant Medications   ezetimibe-simvastatin (VYTORIN) 10-40 MG tablet     Respiratory   COPD (chronic obstructive pulmonary disease) (HCC)     Stable no interested in stopping smoking or tx        Endocrine   Diabetes mellitus without complication (HCC)    Followed by endocrinology and elevated A1c        Genitourinary   BPH (benign prostatic hyperplasia)    The current medical regimen is effective;  continue present plan and medications.         Other   Hyperlipidemia    The current medical regimen is effective;  continue present plan and medications.       Relevant Medications   ezetimibe-simvastatin (VYTORIN) 10-40 MG tablet   Malnutrition ([place])    Discussed nutrition nutrition supplements and ankle edema patient will make sure he adds a vitamin to his regimen.      Edema extremities    Discussed ankle edema elevation compression modest salt for diet.  Will avoid diuretics because of hypotension concerns.      Depression, major, single episode, moderate ([place])    Patient noticed to be markedly depressed today.  Seems to be a consequence of weather and health status.  Reviewed care and treatment of depression and will add citalopram 20 mg 1 a day. Will recheck in 2-3 weeks.      Relevant Medications   citalopram (CELEXA) 20 MG tablet   Advanced care planning/counseling discussion    A voluntary discussion about advance care planning including the explanation and discussion of advance directives was extensively discussed  with the patient.  Explanation about the health care proxy and Living will was reviewed and packet with forms with explanation of how to fill them out was given.          Other Visit Diagnoses    PE (physical exam), annual    -  Primary      Will not get rest of physical exam labs today as patient wants to wait 2 weeks or so. Follow up plan: Return in about 2 weeks (around 12/03/2017) for Medicine check.

## 2017-11-19 NOTE — Assessment & Plan Note (Signed)
Discussed ankle edema elevation compression modest salt for diet.  Will avoid diuretics because of hypotension concerns.

## 2017-11-19 NOTE — Assessment & Plan Note (Signed)
Discussed nutrition nutrition supplements and ankle edema patient will make sure he adds a vitamin to his regimen.

## 2017-11-19 NOTE — Assessment & Plan Note (Signed)
Followed by endocrinology and elevated A1c

## 2017-11-19 NOTE — Progress Notes (Signed)
Subjective:   Colin Novak is a 72 y.o. male who presents for Medicare Annual/Subsequent preventive examination.  Review of Systems:   Cardiac Risk Factors include: advanced age (>70men, >59 women);male gender;dyslipidemia;diabetes mellitus     Objective:    Vitals: BP 116/72 (BP Location: Left Arm, Patient Position: Sitting)   Pulse 92   Temp 98.3 F (36.8 C) (Temporal)   Resp 16   Ht 5' 8.5" (1.74 m)   Wt 125 lb 12.8 oz (57.1 kg)   BMI 18.85 kg/m   Body mass index is 18.85 kg/m.  Advanced Directives 11/19/2017  Does Patient Have a Medical Advance Directive? No  Does patient want to make changes to medical advance directive? Yes (MAU/Ambulatory/Procedural Areas - Information given)    Tobacco Social History   Tobacco Use  Smoking Status Current Every Day Smoker  . Packs/day: 1.50  . Types: Cigarettes  Smokeless Tobacco Never Used     Ready to quit: No Counseling given: No   Clinical Intake:  Pre-visit preparation completed: Yes  Pain : No/denies pain     Nutritional Status: BMI <19  Underweight Nutritional Risks: None Diabetes: Yes CBG done?: No Did pt. bring in CBG monitor from home?: No  How often do you need to have someone help you when you read instructions, pamphlets, or other written materials from your doctor or pharmacy?: 1 - Never What is the last grade level you completed in school?: 12th grade  Interpreter Needed?: No  Information entered by :: Sequita Wise,LPN   Past Medical History:  Diagnosis Date  . COPD (chronic obstructive pulmonary disease) ([place])   . Diabetes mellitus without complication ([place])   . Hyperlipidemia    Past Surgical History:  Procedure Laterality Date  . APPENDECTOMY    . EYE SURGERY Bilateral    cataract   Family History  Problem Relation Age of Onset  . Cancer Mother        lung (non smoker)  . Diabetes Father   . Hypertension Father    Social History   Socioeconomic History  . Marital status:  Married    Spouse name: None  . Number of children: None  . Years of education: None  . Highest education level: None  Social Needs  . Financial resource strain: Not very hard  . Food insecurity - worry: Never true  . Food insecurity - inability: Never true  . Transportation needs - medical: No  . Transportation needs - non-medical: No  Occupational History  . None  Tobacco Use  . Smoking status: Current Every Day Smoker    Packs/day: 1.50    Types: Cigarettes  . Smokeless tobacco: Never Used  Substance and Sexual Activity  . Alcohol use: No  . Drug use: No  . Sexual activity: None  Other Topics Concern  . None  Social History Narrative  . None    Outpatient Encounter Medications as of 11/19/2017  Medication Sig  . aspirin EC 81 MG tablet Take 81 mg by mouth daily.  Marland Kitchen ezetimibe-simvastatin (VYTORIN) 10-40 MG tablet TAKE 1 TABLET BY MOUTH ONCE DAILY  . glucose blood (ONE TOUCH ULTRA TEST) test strip 1 each by Other route 2 (two) times daily as needed for other. Dx: E11.9  . INVOKANA 300 MG TABS tablet TAKE ONE TABLET BY MOUTH ONCE DAILY  . LANTUS SOLOSTAR 100 UNIT/ML Solostar Pen INJECT 10-20 UNITS INTO THE SKIN DAILY  . metFORMIN (GLUCOPHAGE) 500 MG tablet TAKE ONE TABLET BY MOUTH TWICE  DAILY  . Multiple Vitamin (MULTIVITAMIN) tablet Take 1 tablet by mouth daily.  Marland Kitchen NOVOFINE 32G X 6 MM MISC USE AS DIRECTED. 5 INJECTIONS A DAY  . pioglitazone (ACTOS) 30 MG tablet   . [DISCONTINUED] co-enzyme Q-10 30 MG capsule Take 30 mg by mouth 3 (three) times daily.  . [DISCONTINUED] Liraglutide 18 MG/3ML SOPN Inject 0.3 mLs (1.8 mg total) into the skin daily. Start 0.6 for 1 week then 1.2 for 1 week then 1.8 (Patient not taking: Reported on 11/19/2017)   No facility-administered encounter medications on file as of 11/19/2017.     Activities of Daily Living In your present state of health, do you have any difficulty performing the following activities: 11/19/2017  Hearing? N  Vision? N    Difficulty concentrating or making decisions? N  Walking or climbing stairs? N  Dressing or bathing? N  Doing errands, shopping? N  Preparing Food and eating ? N  Using the Toilet? N  In the past six months, have you accidently leaked urine? N  Do you have problems with loss of bowel control? N  Managing your Medications? N  Managing your Finances? N  Housekeeping or managing your Housekeeping? N  Some recent data might be hidden    Patient Care Team: Guadalupe Maple, MD as PCP - General (Family Medicine) Solum, Betsey Holiday, MD as Physician Assistant (Endocrinology)   Assessment:   This is a routine wellness examination for Colin Novak.  Exercise Activities and Dietary recommendations Current Exercise Habits: The patient does not participate in regular exercise at present, Exercise limited by: None identified  Goals    . Quit Smoking     Smoking cessation discussed       Fall Risk Fall Risk  11/19/2017 12/25/2016 08/06/2016 07/24/2015  Falls in the past year? No No Yes Yes  Number falls in past yr: - - 1 1  Injury with Fall? - - Yes No   Is the patient's home free of loose throw rugs in walkways, pet beds, electrical cords, etc?   yes      Grab bars in the bathroom? no      Handrails on the stairs?   yes      Adequate lighting?   yes  Timed Get Up and Go Performed: completed in 9 seconds with no use of assistive devices. Steady gait. No intervention needed at this time.   Depression Screen PHQ 2/9 Scores 11/19/2017 08/06/2016 07/24/2015  PHQ - 2 Score 0 0 0    Cognitive Function     6CIT Screen 11/19/2017  What Year? 0 points  What month? 0 points  What time? 0 points  Count back from 20 0 points  Months in reverse 0 points  Repeat phrase 0 points  Total Score 0    Immunization History  Administered Date(s) Administered  . Influenza, High Dose Seasonal PF 07/09/2016, 11/19/2017  . Influenza,inj,Quad PF,6+ Mos 07/24/2015  . Pneumococcal Conjugate-13 05/23/2014  .  Pneumococcal Polysaccharide-23 12/05/2015  . Pneumococcal-Unspecified 08/17/2007  . Td 07/02/2005  . Tdap 10/06/2016  . Zoster 08/04/2008    Qualifies for Shingles Vaccine? Discussed shingrix vaccine  Screening Tests Health Maintenance  Topic Date Due  . OPHTHALMOLOGY EXAM  11/18/2016  . FOOT EXAM  12/04/2016  . URINE MICROALBUMIN  07/09/2017  . HEMOGLOBIN A1C  10/01/2017  . COLONOSCOPY  11/27/2017 (Originally 03/17/1995)  . TETANUS/TDAP  10/06/2026  . INFLUENZA VACCINE  Completed  . Hepatitis C Screening  Completed  . PNA  vac Low Risk Adult  Completed   Cancer Screenings: Lung: Low Dose CT Chest recommended if Age 24-80 years, 30 pack-year currently smoking OR have quit w/in 15years. Patient does qualify. - declined Colorectal: due - patient declined  Additional Screenings:  Hepatitis B/HIV/Syphillis: not indicated Hepatitis C Screening: completed 10/06/2016    Plan:    I have personally reviewed and addressed the Medicare Annual Wellness questionnaire and have noted the following in the patient's chart:  A. Medical and social history B. Use of alcohol, tobacco or illicit drugs  C. Current medications and supplements D. Functional ability and status E.  Nutritional status F.  Physical activity G. Advance directives H. List of other physicians I.  Hospitalizations, surgeries, and ER visits in previous 12 months J.  [place] such as hearing and vision if needed, cognitive and depression L. Referrals and appointments   In addition, I have reviewed and discussed with patient certain preventive protocols, quality metrics, and best practice recommendations. A written personalized care plan for preventive services as well as general preventive health recommendations were provided to patient.   Signed,  Tyler Aas, LPN Nurse Health Advisor   Nurse Notes: due for diabetic foot exam.  Discussed need for diabetic eye exam.

## 2017-12-07 ENCOUNTER — Encounter: Payer: Self-pay | Admitting: Family Medicine

## 2017-12-07 ENCOUNTER — Ambulatory Visit: Payer: Medicare Other | Admitting: Family Medicine

## 2017-12-07 DIAGNOSIS — E440 Moderate protein-calorie malnutrition: Secondary | ICD-10-CM

## 2017-12-07 DIAGNOSIS — F321 Major depressive disorder, single episode, moderate: Secondary | ICD-10-CM

## 2017-12-07 DIAGNOSIS — E119 Type 2 diabetes mellitus without complications: Secondary | ICD-10-CM

## 2017-12-07 MED ORDER — CITALOPRAM HYDROBROMIDE 20 MG PO TABS
20.0000 mg | ORAL_TABLET | Freq: Every day | ORAL | 5 refills | Status: DC
Start: 2017-12-07 — End: 2018-11-02

## 2017-12-07 NOTE — Assessment & Plan Note (Signed)
Certainly some improvement patient brighter and smiling a little more interactive will continue current medications recheck in 2 months

## 2017-12-07 NOTE — Assessment & Plan Note (Signed)
Followed by endocrinology discussed alternatives to Invokana Patient will check with pharmacy to see which is covered.

## 2017-12-07 NOTE — Assessment & Plan Note (Signed)
Trying to eat a little better

## 2017-12-07 NOTE — Progress Notes (Signed)
BP 106/68   Pulse 88   Ht 5\' 8"  (1.727 m)   Wt 125 lb (56.7 kg)   SpO2 99%   BMI 19.01 kg/m    Subjective:    Patient ID: Colin Novak, male    DOB: [birth year], 72 y.o.   MRN: [id number]  HPI: Colin Novak is a 72 y.o. male  Med check  Patient all in all is noticed may be some improvement on citalopram patient also seems a little bit brighter.  Is sleeping okay with no issues energy may be has improved slightly. No issues with citalopram 20 mg. Diabetes Invokana not being covered anymore. Patient will check with pharmacy and see which one is preferred either [place] or Jardiance.  Relevant past medical, surgical, family and social history reviewed and updated as indicated. Interim medical history since our last visit reviewed. Allergies and medications reviewed and updated.  Review of Systems  Constitutional: Negative.   Respiratory: Negative.   Cardiovascular: Negative.     Per HPI unless specifically indicated above     Objective:    BP 106/68   Pulse 88   Ht 5\' 8"  (1.727 m)   Wt 125 lb (56.7 kg)   SpO2 99%   BMI 19.01 kg/m   Wt Readings from Last 3 Encounters:  12/07/17 125 lb (56.7 kg)  11/19/17 125 lb 12.8 oz (57.1 kg)  11/19/17 125 lb 12.8 oz (57.1 kg)    Physical Exam  Constitutional: He is oriented to person, place, and time. He appears well-developed and well-nourished.  HENT:  Head: Normocephalic and atraumatic.  Eyes: Conjunctivae and EOM are normal.  Neck: Normal range of motion.  Cardiovascular: Normal rate, regular rhythm and normal heart sounds.  Pulmonary/Chest: Effort normal and breath sounds normal.  Musculoskeletal: Normal range of motion.  Neurological: He is alert and oriented to person, place, and time.  Skin: No erythema.  Psychiatric: He has a normal mood and affect. His behavior is normal. Judgment and thought content normal.    Results for orders placed or performed in visit on 64/33/29  Basic metabolic panel  Result  Value Ref Range   Glucose 128 (H) 65 - 99 mg/dL   BUN 15 8 - 27 mg/dL   Creatinine, Ser 0.68 (L) 0.76 - 1.27 mg/dL   GFR calc non Af Amer 95 >59 mL/min/1.73   GFR calc Af Amer 110 >59 mL/min/1.73   BUN/Creatinine Ratio 22 10 - 24   Sodium 142 134 - 144 mmol/L   Potassium 4.0 3.5 - 5.2 mmol/L   Chloride 100 96 - 106 mmol/L   CO2 27 18 - 29 mmol/L   Calcium 9.9 8.6 - 10.2 mg/dL  LP+ALT+AST Piccolo, Waived  Result Value Ref Range   ALT (SGPT) Piccolo, Waived 48 (H) 10 - 47 U/L   AST (SGOT) Piccolo, Waived 55 (H) 11 - 38 U/L   Cholesterol Piccolo, Waived 96 <200 mg/dL   HDL Chol Piccolo, Waived 50 (L) >59 mg/dL   Triglycerides Piccolo,Waived 77 <150 mg/dL   Chol/HDL Ratio Piccolo,Waive 1.9 mg/dL   LDL Chol Calc Piccolo Waived 30 <100 mg/dL   VLDL Chol Calc Piccolo,Waive 15 <30 mg/dL  Bayer DCA Hb A1c Waived  Result Value Ref Range   Bayer DCA Hb A1c Waived 8.2 (H) <7.0 %      Assessment & Plan:   Problem List Items Addressed This Visit      Endocrine   Diabetes mellitus without complication ([place])  Followed by endocrinology discussed alternatives to Invokana Patient will check with pharmacy to see which is covered.        Other   Malnutrition ([place])    Trying to eat a little better      Depression, major, single episode, moderate ([place])    Certainly some improvement patient brighter and smiling a little more interactive will continue current medications recheck in 2 months      Relevant Medications   citalopram (CELEXA) 20 MG tablet       Follow up plan: Return in about 2 months (around 02/04/2018).

## 2017-12-25 ENCOUNTER — Other Ambulatory Visit: Payer: Self-pay | Admitting: Family Medicine

## 2017-12-29 ENCOUNTER — Telehealth: Payer: Self-pay | Admitting: Family Medicine

## 2017-12-29 MED ORDER — EMPAGLIFLOZIN 25 MG PO TABS
25.0000 mg | ORAL_TABLET | Freq: Every day | ORAL | 1 refills | Status: DC
Start: 2017-12-29 — End: 2018-04-24

## 2017-12-29 NOTE — Telephone Encounter (Signed)
Patient notified that Colin Novak was sent to his pharmacy.

## 2017-12-29 NOTE — Telephone Encounter (Signed)
Can we please find out what is covered? I'll be happy to switch

## 2017-12-29 NOTE — Telephone Encounter (Signed)
Copied from [place] [phone]. Topic: Quick Communication - See Telephone Encounter >> Dec 29, 2017  2:37 PM Synthia Innocent wrote: CRM for notification. See Telephone encounter for:  PA on INVOKANA 300 MG TABS tablet has been denied due to only 1 formulary med being tried.  12/29/17.

## 2017-12-29 NOTE — Telephone Encounter (Signed)
Please send in [place]

## 2018-02-04 ENCOUNTER — Encounter: Payer: Self-pay | Admitting: Family Medicine

## 2018-02-04 ENCOUNTER — Ambulatory Visit: Payer: Medicare Other | Admitting: Family Medicine

## 2018-02-04 DIAGNOSIS — E440 Moderate protein-calorie malnutrition: Secondary | ICD-10-CM

## 2018-02-04 DIAGNOSIS — F321 Major depressive disorder, single episode, moderate: Secondary | ICD-10-CM | POA: Diagnosis not present

## 2018-02-04 NOTE — Assessment & Plan Note (Signed)
The current medical regimen is effective;  continue present plan and medications.  

## 2018-02-04 NOTE — Assessment & Plan Note (Signed)
Discussed diet  

## 2018-02-04 NOTE — Progress Notes (Signed)
BP 106/62   Pulse (!) 105   Ht 5\' 8"  (1.727 m)   Wt 119 lb 9.6 oz (54.3 kg)   SpO2 96%   BMI 18.19 kg/m    Subjective:    Patient ID: Colin Novak, male    DOB: 1946-10-23, 72 y.o.   MRN: [id number]  HPI: Colin Novak is a 72 y.o. male  Chief Complaint  Patient presents with  . Follow-up  . Depression  . Leg Swelling   Patient all in all doing much better depression score of 0.  No complaints from citalopram. Trying to eat a little better. Edema continues wears occasional compression socks but difficulty getting them on of course otherwise doing well.  Relevant past medical, surgical, family and social history reviewed and updated as indicated. Interim medical history since our last visit reviewed. Allergies and medications reviewed and updated.  Review of Systems  Constitutional: Negative.   Respiratory: Negative.   Cardiovascular: Negative.     Per HPI unless specifically indicated above     Objective:    BP 106/62   Pulse (!) 105   Ht 5\' 8"  (1.727 m)   Wt 119 lb 9.6 oz (54.3 kg)   SpO2 96%   BMI 18.19 kg/m   Wt Readings from Last 3 Encounters:  02/04/18 119 lb 9.6 oz (54.3 kg)  12/07/17 125 lb (56.7 kg)  11/19/17 125 lb 12.8 oz (57.1 kg)    Physical Exam  Constitutional: He is oriented to person, place, and time. He appears well-developed and well-nourished. No distress.  HENT:  Head: Normocephalic and atraumatic.  Right Ear: Hearing normal.  Left Ear: Hearing normal.  Nose: Nose normal.  Eyes: Conjunctivae and lids are normal. Right eye exhibits no discharge. Left eye exhibits no discharge. No scleral icterus.  Cardiovascular: Normal rate, regular rhythm and normal heart sounds.  Pulmonary/Chest: Effort normal. No respiratory distress.  Musculoskeletal: Normal range of motion.  Neurological: He is alert and oriented to person, place, and time.  Skin: Skin is intact. No rash noted.  Psychiatric: He has a normal mood and affect. His speech is  normal and behavior is normal. Judgment and thought content normal. Cognition and memory are normal.    Results for orders placed or performed in visit on 51/76/16  Basic metabolic panel  Result Value Ref Range   Glucose 128 (H) 65 - 99 mg/dL   BUN 15 8 - 27 mg/dL   Creatinine, Ser 0.68 (L) 0.76 - 1.27 mg/dL   GFR calc non Af Amer 95 >59 mL/min/1.73   GFR calc Af Amer 110 >59 mL/min/1.73   BUN/Creatinine Ratio 22 10 - 24   Sodium 142 134 - 144 mmol/L   Potassium 4.0 3.5 - 5.2 mmol/L   Chloride 100 96 - 106 mmol/L   CO2 27 18 - 29 mmol/L   Calcium 9.9 8.6 - 10.2 mg/dL  LP+ALT+AST Piccolo, Waived  Result Value Ref Range   ALT (SGPT) Piccolo, Waived 48 (H) 10 - 47 U/L   AST (SGOT) Piccolo, Waived 55 (H) 11 - 38 U/L   Cholesterol Piccolo, Waived 96 <200 mg/dL   HDL Chol Piccolo, Waived 50 (L) >59 mg/dL   Triglycerides Piccolo,Waived 77 <150 mg/dL   Chol/HDL Ratio Piccolo,Waive 1.9 mg/dL   LDL Chol Calc Piccolo Waived 30 <100 mg/dL   VLDL Chol Calc Piccolo,Waive 15 <30 mg/dL  Bayer DCA Hb A1c Waived  Result Value Ref Range   Bayer DCA Hb A1c Waived 8.2 (  H) <7.0 %      Assessment & Plan:   Problem List Items Addressed This Visit      Other   Malnutrition ([place])    Discussed diet      Depression, major, single episode, moderate ([place])    The current medical regimen is effective;  continue present plan and medications.           Follow up plan: Return for Aug med check.

## 2018-02-17 DIAGNOSIS — R636 Underweight: Secondary | ICD-10-CM | POA: Diagnosis not present

## 2018-02-17 DIAGNOSIS — F17200 Nicotine dependence, unspecified, uncomplicated: Secondary | ICD-10-CM | POA: Diagnosis not present

## 2018-02-17 DIAGNOSIS — E1165 Type 2 diabetes mellitus with hyperglycemia: Secondary | ICD-10-CM | POA: Diagnosis not present

## 2018-02-17 DIAGNOSIS — Z794 Long term (current) use of insulin: Secondary | ICD-10-CM | POA: Diagnosis not present

## 2018-02-17 LAB — HEMOGLOBIN A1C: Hemoglobin A1C: 7.1

## 2018-02-17 LAB — MICROALBUMIN, URINE: Microalb, Ur: 7

## 2018-03-17 ENCOUNTER — Encounter: Payer: Self-pay | Admitting: Family Medicine

## 2018-03-17 DIAGNOSIS — H02053 Trichiasis without entropian right eye, unspecified eyelid: Secondary | ICD-10-CM | POA: Diagnosis not present

## 2018-03-17 DIAGNOSIS — E1165 Type 2 diabetes mellitus with hyperglycemia: Secondary | ICD-10-CM | POA: Diagnosis not present

## 2018-03-17 LAB — HM DIABETES EYE EXAM

## 2018-04-16 ENCOUNTER — Encounter: Payer: Self-pay | Admitting: Family Medicine

## 2018-04-24 ENCOUNTER — Other Ambulatory Visit: Payer: Self-pay | Admitting: Family Medicine

## 2018-05-24 DIAGNOSIS — R636 Underweight: Secondary | ICD-10-CM | POA: Insufficient documentation

## 2018-05-24 DIAGNOSIS — E119 Type 2 diabetes mellitus without complications: Secondary | ICD-10-CM | POA: Diagnosis not present

## 2018-05-24 DIAGNOSIS — F17200 Nicotine dependence, unspecified, uncomplicated: Secondary | ICD-10-CM | POA: Insufficient documentation

## 2018-05-24 DIAGNOSIS — Z794 Long term (current) use of insulin: Secondary | ICD-10-CM | POA: Diagnosis not present

## 2018-05-24 DIAGNOSIS — E440 Moderate protein-calorie malnutrition: Secondary | ICD-10-CM | POA: Insufficient documentation

## 2018-05-24 DIAGNOSIS — R634 Abnormal weight loss: Secondary | ICD-10-CM | POA: Insufficient documentation

## 2018-05-24 LAB — HEMOGLOBIN A1C
Hemoglobin A1C: 9.4
Hemoglobin A1C: 9.4

## 2018-06-02 ENCOUNTER — Other Ambulatory Visit: Payer: Self-pay

## 2018-06-02 ENCOUNTER — Ambulatory Visit: Payer: Medicare Other | Admitting: Family Medicine

## 2018-06-02 ENCOUNTER — Encounter: Payer: Self-pay | Admitting: Family Medicine

## 2018-06-02 VITALS — BP 106/63 | HR 80 | Temp 97.3°F | Wt 118.3 lb

## 2018-06-02 DIAGNOSIS — Z1211 Encounter for screening for malignant neoplasm of colon: Secondary | ICD-10-CM

## 2018-06-02 DIAGNOSIS — E7800 Pure hypercholesterolemia, unspecified: Secondary | ICD-10-CM | POA: Diagnosis not present

## 2018-06-02 DIAGNOSIS — F321 Major depressive disorder, single episode, moderate: Secondary | ICD-10-CM

## 2018-06-02 DIAGNOSIS — E440 Moderate protein-calorie malnutrition: Secondary | ICD-10-CM | POA: Diagnosis not present

## 2018-06-02 NOTE — Assessment & Plan Note (Signed)
The current medical regimen is effective;  continue present plan and medications.  

## 2018-06-02 NOTE — Progress Notes (Signed)
   BP 106/63   Pulse 80   Temp (!) 97.3 F (36.3 C) (Tympanic)   Wt 118 lb 4.8 oz (53.7 kg)   SpO2 94%   BMI 17.99 kg/m    Subjective:    Patient ID: Colin Novak, male    DOB: 1946/10/17, 72 y.o.   MRN: [id number]  HPI: Colin Novak is a 72 y.o. male  Chief Complaint  Patient presents with  . Depression   Patient all in all doing okay depression stable depression screener is negative.  Patient taking citalopram 20 mg 1 a day without problems. Stop Jardiance because of concern about weight loss. Patient's weight is stable today but does have some extra weight in his pockets. Diabetes has been poor control but endocrinology is trying different medications stopped Jardiance because of cost and weight loss.  Had increased his Lantus to 20 units and patient not experiencing low blood sugar spells. Trying to eat may be a little bit better.  Relevant past medical, surgical, family and social history reviewed and updated as indicated. Interim medical history since our last visit reviewed. Allergies and medications reviewed and updated.  Review of Systems  Constitutional: Negative.   Respiratory: Negative.   Cardiovascular: Negative.     Per HPI unless specifically indicated above     Objective:    BP 106/63   Pulse 80   Temp (!) 97.3 F (36.3 C) (Tympanic)   Wt 118 lb 4.8 oz (53.7 kg)   SpO2 94%   BMI 17.99 kg/m   Wt Readings from Last 3 Encounters:  06/02/18 118 lb 4.8 oz (53.7 kg)  02/04/18 119 lb 9.6 oz (54.3 kg)  12/07/17 125 lb (56.7 kg)    Physical Exam  Constitutional: He is oriented to person, place, and time. He appears well-developed and well-nourished.  HENT:  Head: Normocephalic and atraumatic.  Eyes: Conjunctivae and EOM are normal.  Neck: Normal range of motion.  Cardiovascular: Normal rate, regular rhythm and normal heart sounds.  Pulmonary/Chest: Effort normal and breath sounds normal.  Musculoskeletal: Normal range of motion.  Neurological:  He is alert and oriented to person, place, and time.  Skin: No erythema.  Psychiatric: He has a normal mood and affect. His behavior is normal. Judgment and thought content normal.    Results for orders placed or performed in visit on 05/24/18  Hemoglobin A1c  Result Value Ref Range   Hemoglobin A1C 7.1   Microalbumin, urine  Result Value Ref Range   Microalb, Ur 7   Hemoglobin A1c  Result Value Ref Range   Hemoglobin A1C 9.4   HM DIABETES EYE EXAM  Result Value Ref Range   HM Diabetic Eye Exam No Retinopathy No Retinopathy      Assessment & Plan:   Problem List Items Addressed This Visit      Other   Hyperlipidemia    The current medical regimen is effective;  continue present plan and medications.       Malnutrition ([employer])    Weight has remained stable discussed diet nutrition again with patient      Depression, major, single episode, moderate ([place])    The current medical regimen is effective;  continue present plan and medications.        Other Visit Diagnoses    Colon cancer screening    -  Primary       Follow up plan: Return in about 6 months (around 12/03/2018).

## 2018-06-02 NOTE — Assessment & Plan Note (Signed)
Weight has remained stable discussed diet nutrition again with patient

## 2018-06-08 ENCOUNTER — Ambulatory Visit: Payer: Medicare Other | Admitting: Family Medicine

## 2018-06-25 ENCOUNTER — Telehealth: Payer: Self-pay | Admitting: Family Medicine

## 2018-06-25 NOTE — Telephone Encounter (Signed)
Copied from [place] [phone]. Topic: Quick Communication - See Telephone Encounter >> Jun 25, 2018 11:14 AM Gardiner Ramus wrote: CRM for notification. See Telephone encounter for: 06/25/18. Pt called and stated that someone called him and told him that he needed to schedule A1c. He does not know who called him. Please advise

## 2018-06-29 NOTE — Telephone Encounter (Signed)
Left message for patient advising him to disregard the message he is not due for his A1C and he sees Endocrinology for management.

## 2018-08-27 DIAGNOSIS — E119 Type 2 diabetes mellitus without complications: Secondary | ICD-10-CM | POA: Diagnosis not present

## 2018-08-27 DIAGNOSIS — Z794 Long term (current) use of insulin: Secondary | ICD-10-CM | POA: Diagnosis not present

## 2018-09-03 DIAGNOSIS — F17200 Nicotine dependence, unspecified, uncomplicated: Secondary | ICD-10-CM | POA: Diagnosis not present

## 2018-09-03 DIAGNOSIS — Z23 Encounter for immunization: Secondary | ICD-10-CM | POA: Diagnosis not present

## 2018-09-03 DIAGNOSIS — E1165 Type 2 diabetes mellitus with hyperglycemia: Secondary | ICD-10-CM | POA: Diagnosis not present

## 2018-09-03 DIAGNOSIS — E119 Type 2 diabetes mellitus without complications: Secondary | ICD-10-CM | POA: Diagnosis not present

## 2018-11-02 ENCOUNTER — Other Ambulatory Visit: Payer: Self-pay | Admitting: Family Medicine

## 2018-11-25 ENCOUNTER — Ambulatory Visit (INDEPENDENT_AMBULATORY_CARE_PROVIDER_SITE_OTHER): Payer: Medicare Other

## 2018-11-25 VITALS — BP 118/62 | HR 81 | Temp 97.8°F | Ht 68.0 in | Wt 118.6 lb

## 2018-11-25 DIAGNOSIS — Z0000 Encounter for general adult medical examination without abnormal findings: Secondary | ICD-10-CM | POA: Diagnosis not present

## 2018-11-25 NOTE — Progress Notes (Signed)
Subjective:   Colin Novak is a 73 y.o. male who presents for Medicare Annual/Subsequent preventive examination.  Review of Systems:  N/A  Cardiac Risk Factors include: advanced age (>71men, >13 women);male gender;diabetes mellitus;dyslipidemia;smoking/ tobacco exposure     Objective:    Vitals: BP 118/62 (BP Location: Left Arm)   Pulse 81   Temp 97.8 F (36.6 C) (Oral)   Ht 5\' 8"  (1.727 m)   Wt 118 lb 9.6 oz (53.8 kg)   BMI 18.03 kg/m   Body mass index is 18.03 kg/m.  Advanced Directives 11/25/2018 11/19/2017  Does Patient Have a Medical Advance Directive? No No  Does patient want to make changes to medical advance directive? - Yes (MAU/Ambulatory/Procedural Areas - Information given)    Tobacco Social History   Tobacco Use  Smoking Status Current Every Day Smoker  . Packs/day: 2.00  . Types: Cigarettes  Smokeless Tobacco Never Used     Ready to quit: No Counseling given: No   Clinical Intake:  Pre-visit preparation completed: Yes  Pain : No/denies pain Pain Score: 0-No pain    Diabetes:  Is the patient diabetic?  Yes type 2 If diabetic, was a CBG obtained today?  No  Did the patient bring in their glucometer from home?  No  How often do you monitor your CBG's? Twice daily.   Financial Strains and Diabetes Management:  Are you having any financial strains with the device, your supplies or your medication? No .  Does the patient want to be seen by Chronic Care Management for management of their diabetes?  No  Would the patient like to be referred to a Nutritionist or for Diabetic Management?  No   Diabetic Exams:  Diabetic Eye Exam: Completed 03/17/18.   Diabetic Foot Exam: Completed 09/03/18 with Dr Gabriel Carina.   Nutritional Status: BMI <19  Underweight Nutritional Risks: None   How often do you need to have someone help you when you read instructions, pamphlets, or other written materials from your doctor or pharmacy?: 1 - Never  Interpreter  Needed?: No  Information entered by :: [facility], LPN  Past Medical History:  Diagnosis Date  . COPD (chronic obstructive pulmonary disease) ([place])   . Diabetes mellitus without complication ([place])    type 2  . Hyperlipidemia    Past Surgical History:  Procedure Laterality Date  . APPENDECTOMY    . EYE SURGERY Bilateral    cataract   Family History  Problem Relation Age of Onset  . Cancer Mother        lung (non smoker)  . Diabetes Father   . Hypertension Father    Social History   Socioeconomic History  . Marital status: Widowed    Spouse name: Not on file  . Number of children: 1  . Years of education: Not on file  . Highest education level: High school graduate  Occupational History  . Occupation: retired  Scientific laboratory technician  . Financial resource strain: Not very hard  . Food insecurity:    Worry: Never true    Inability: Never true  . Transportation needs:    Medical: No    Non-medical: No  Tobacco Use  . Smoking status: Current Every Day Smoker    Packs/day: 2.00    Types: Cigarettes  . Smokeless tobacco: Never Used  Substance and Sexual Activity  . Alcohol use: No  . Drug use: No  . Sexual activity: Not on file  Lifestyle  . Physical activity:  Days per week: 0 days    Minutes per session: 0 min  . Stress: Not at all  Relationships  . Social connections:    Talks on phone: Once a week    Gets together: Never    Attends religious service: Never    Active member of club or organization: No    Attends meetings of clubs or organizations: Never    Relationship status: Widowed  Other Topics Concern  . Not on file  Social History Narrative  . Not on file    Outpatient Encounter Medications as of 11/25/2018  Medication Sig  . aspirin EC 81 MG tablet Take 81 mg by mouth daily.  . citalopram (CELEXA) 20 MG tablet TAKE 1 TABLET BY MOUTH ONCE DAILY  . glucose blood (ONE TOUCH ULTRA TEST) test strip 1 each by Other route 2 (two) times daily as needed for  other. Dx: E11.9  . LANTUS SOLOSTAR 100 UNIT/ML Solostar Pen INJECT 10-20 UNITS INTO THE SKIN DAILY (Patient taking differently: Inject 20 Units into the skin daily. )  . metFORMIN (GLUCOPHAGE) 500 MG tablet TAKE ONE TABLET BY MOUTH TWICE DAILY  . Multiple Vitamin (MULTIVITAMIN) tablet Take 1 tablet by mouth daily.  Marland Kitchen NOVOFINE 32G X 6 MM MISC USE AS DIRECTED. 5 INJECTIONS A DAY  . oxymetazoline (AFRIN) 0.05 % nasal spray Place 2 sprays into both nostrils 2 (two) times daily. Only using once daily in AM  . pioglitazone (ACTOS) 30 MG tablet Take 30 mg by mouth daily.   . pseudoephedrine-acetaminophen (TYLENOL SINUS) 30-500 MG TABS tablet Take 2 tablets by mouth every 4 (four) hours as needed. Only using twice daily  . ezetimibe-simvastatin (VYTORIN) 10-40 MG tablet Take 1 tablet by mouth daily. (Patient not taking: Reported on 11/25/2018)   No facility-administered encounter medications on file as of 11/25/2018.     Activities of Daily Living In your present state of health, do you have any difficulty performing the following activities: 11/25/2018  Hearing? N  Vision? N  Comment Wears eye glasses daily.   Difficulty concentrating or making decisions? N  Walking or climbing stairs? N  Dressing or bathing? N  Doing errands, shopping? N  Preparing Food and eating ? N  Using the Toilet? N  In the past six months, have you accidently leaked urine? N  Do you have problems with loss of bowel control? N  Managing your Medications? N  Managing your Finances? N  Housekeeping or managing your Housekeeping? N  Some recent data might be hidden    Patient Care Team: Guadalupe Maple, MD as PCP - General (Family Medicine) Solum, Betsey Holiday, MD as Physician Assistant (Endocrinology) Kem Parkinson, MD (Ophthalmology)   Assessment:   This is a routine wellness examination for Colin Novak.  Exercise Activities and Dietary recommendations Current Exercise Habits: The patient does not participate in  regular exercise at present, Exercise limited by: None identified  Goals    . Prevent falls     Recommend to remove any items from the home that may cause slips or trips.    . Quit Smoking     Smoking cessation discussed       Fall Risk Fall Risk  11/25/2018 02/04/2018 12/07/2017 11/19/2017 12/25/2016  Falls in the past year? 1 No No No No  Number falls in past yr: 0 - - - -  Injury with Fall? 0 - - - -  Follow up Falls prevention discussed - - - -   FALL RISK  PREVENTION PERTAINING TO THE HOME:  Any stairs in or around the home WITH handrails? No  Home free of loose throw rugs in walkways, pet beds, electrical cords, etc? Yes  Adequate lighting in your home to reduce risk of falls? Yes   ASSISTIVE DEVICES UTILIZED TO PREVENT FALLS:  Life alert? No  Use of a cane, walker or w/c? No  Grab bars in the bathroom? No  Shower chair or bench in shower? No  Elevated toilet seat or a handicapped toilet? Yes    TIMED UP AND GO:  Was the test performed? No .     Depression Screen PHQ 2/9 Scores 11/25/2018 02/04/2018 12/07/2017 11/19/2017  PHQ - 2 Score 0 0 0 0    Cognitive Function     6CIT Screen 11/19/2017  What Year? 0 points  What month? 0 points  What time? 0 points  Count back from 20 0 points  Months in reverse 0 points  Repeat phrase 0 points  Total Score 0    Immunization History  Administered Date(s) Administered  . Influenza, High Dose Seasonal PF 07/09/2016, 11/19/2017, 09/03/2018  . Influenza,inj,Quad PF,6+ Mos 07/24/2015  . Pneumococcal Conjugate-13 05/23/2014  . Pneumococcal Polysaccharide-23 12/05/2015  . Pneumococcal-Unspecified 08/17/2007  . Td 07/02/2005  . Tdap 10/06/2016  . Zoster 08/04/2008    Qualifies for Shingles Vaccine? Yes  Zostavax completed 08/04/08. Due for Shingrix. Education has been provided regarding the importance of this vaccine. Pt has been advised to call insurance company to determine out of pocket expense. Advised may also  receive vaccine at local pharmacy or Health Dept. Verbalized acceptance and understanding.  Tdap: Up to date  Flu Vaccine: Up to date  Pneumococcal Vaccine: Up to date   Screening Tests Health Maintenance  Topic Date Due  . COLONOSCOPY  06/03/2019 (Originally 03/17/1995)  . URINE MICROALBUMIN  02/18/2019  . HEMOGLOBIN A1C  03/04/2019  . OPHTHALMOLOGY EXAM  03/18/2019  . FOOT EXAM  09/04/2019  . TETANUS/TDAP  10/06/2026  . INFLUENZA VACCINE  Completed  . Hepatitis C Screening  Completed  . PNA vac Low Risk Adult  Completed   Cancer Screenings:  Colorectal Screening: Completed 02/11/03. Repeat every 10 years. Pt declined referral today.   Lung Cancer Screening: (Low Dose CT Chest recommended if Age 45-80 years, 30 pack-year currently smoking OR have quit w/in 15years.) does qualify, however declines referral.    Additional Screening:  Hepatitis C Screening: Up to date  Vision Screening: Recommended annual ophthalmology exams for early detection of glaucoma and other disorders of the eye.  Dental Screening: Recommended annual dental exams for proper oral hygiene  Community Resource Referral:  CRR required this visit?  No        Plan:  I have personally reviewed and addressed the Medicare Annual Wellness questionnaire and have noted the following in the patient's chart:  A. Medical and social history B. Use of alcohol, tobacco or illicit drugs  C. Current medications and supplements D. Functional ability and status E.  Nutritional status F.  Physical activity G. Advance directives H. List of other physicians I.  Hospitalizations, surgeries, and ER visits in previous 12 months J.  [place] such as hearing and vision if needed, cognitive and depression L. Referrals and appointments - none  In addition, I have reviewed and discussed with patient certain preventive protocols, quality metrics, and best practice recommendations. A written personalized care  plan for preventive services as well as general preventive health recommendations were provided  to patient.  See attached scanned questionnaire for additional information.   Signed,  Fabio Neighbors, LPN Nurse Health Advisor   Nurse Recommendations: Pt declined the colonoscopy and chest CT scan referrals today.

## 2018-11-25 NOTE — Patient Instructions (Signed)
Colin Novak , Thank you for taking time to come for your Medicare Wellness Visit. I appreciate your ongoing commitment to your health goals. Please review the following plan we discussed and let me know if I can assist you in the future.   Screening recommendations/referrals: Colonoscopy: Declined referral today. Recommended yearly ophthalmology/optometry visit for glaucoma screening and checkup Recommended yearly dental visit for hygiene and checkup  Vaccinations: Influenza vaccine: Up to date Pneumococcal vaccine: Completed series Tdap vaccine: Up to date, due 10/06/26 Shingles vaccine: Pt declines today.     Advanced directives: Advance directive discussed with you today. Even though you declined this today please call our office should you change your mind and we can give you the proper paperwork for you to fill out.  Conditions/risks identified: Fall prevention and smoking cessation.  Next appointment: 12/06/18 @ 9:00 AM with Dr Jeananne Rama.   Preventive Care 18 Years and Older, Male Preventive care refers to lifestyle choices and visits with your health care provider that can promote health and wellness. What does preventive care include?  A yearly physical exam. This is also called an annual well check.  Dental exams once or twice a year.  Routine eye exams. Ask your health care provider how often you should have your eyes checked.  Personal lifestyle choices, including:  Daily care of your teeth and gums.  Regular physical activity.  Eating a healthy diet.  Avoiding tobacco and drug use.  Limiting alcohol use.  Practicing safe sex.  Taking low doses of aspirin every day.  Taking vitamin and mineral supplements as recommended by your health care provider. What happens during an annual well check? The services and screenings done by your health care provider during your annual well check will depend on your age, overall health, lifestyle risk factors, and family  history of disease. Counseling  Your health care provider may ask you questions about your:  Alcohol use.  Tobacco use.  Drug use.  Emotional well-being.  Home and relationship well-being.  Sexual activity.  Eating habits.  History of falls.  Memory and ability to understand (cognition).  Work and work Statistician. Screening  You may have the following tests or measurements:  Height, weight, and BMI.  Blood pressure.  Lipid and cholesterol levels. These may be checked every 5 years, or more frequently if you are over 49 years old.  Skin check.  Lung cancer screening. You may have this screening every year starting at age 31 if you have a 30-pack-year history of smoking and currently smoke or have quit within the past 15 years.  Fecal occult blood test (FOBT) of the stool. You may have this test every year starting at age 41.  Flexible sigmoidoscopy or colonoscopy. You may have a sigmoidoscopy every 5 years or a colonoscopy every 10 years starting at age 31.  Prostate cancer screening. Recommendations will vary depending on your family history and other risks.  Hepatitis C blood test.  Hepatitis B blood test.  Sexually transmitted disease (STD) testing.  Diabetes screening. This is done by checking your blood sugar (glucose) after you have not eaten for a while (fasting). You may have this done every 1-3 years.  Abdominal aortic aneurysm (AAA) screening. You may need this if you are a current or former smoker.  Osteoporosis. You may be screened starting at age 85 if you are at high risk. Talk with your health care provider about your test results, treatment options, and if necessary, the need for more tests.  Vaccines  Your health care provider may recommend certain vaccines, such as:  Influenza vaccine. This is recommended every year.  Tetanus, diphtheria, and acellular pertussis (Tdap, Td) vaccine. You may need a Td booster every 10 years.  Zoster vaccine.  You may need this after age 22.  Pneumococcal 13-valent conjugate (PCV13) vaccine. One dose is recommended after age 3.  Pneumococcal polysaccharide (PPSV23) vaccine. One dose is recommended after age 80. Talk to your health care provider about which screenings and vaccines you need and how often you need them. This information is not intended to replace advice given to you by your health care provider. Make sure you discuss any questions you have with your health care provider. Document Released: 11/09/2015 Document Revised: 07/02/2016 Document Reviewed: 08/14/2015 Elsevier Interactive Patient Education  2017 [place] Prevention in the Home Falls can cause injuries. They can happen to people of all ages. There are many things you can do to make your home safe and to help prevent falls. What can I do on the outside of my home?  Regularly fix the edges of walkways and driveways and fix any cracks.  Remove anything that might make you trip as you walk through a door, such as a raised step or threshold.  Trim any bushes or trees on the path to your home.  Use bright outdoor lighting.  Clear any walking paths of anything that might make someone trip, such as rocks or tools.  Regularly check to see if handrails are loose or broken. Make sure that both sides of any steps have handrails.  Any raised decks and porches should have guardrails on the edges.  Have any leaves, snow, or ice cleared regularly.  Use sand or salt on walking paths during winter.  Clean up any spills in your garage right away. This includes oil or grease spills. What can I do in the bathroom?  Use night lights.  Install grab bars by the toilet and in the tub and shower. Do not use towel bars as grab bars.  Use non-skid mats or decals in the tub or shower.  If you need to sit down in the shower, use a plastic, non-slip stool.  Keep the floor dry. Clean up any water that spills on the floor as soon  as it happens.  Remove soap buildup in the tub or shower regularly.  Attach bath mats securely with double-sided non-slip rug tape.  Do not have throw rugs and other things on the floor that can make you trip. What can I do in the bedroom?  Use night lights.  Make sure that you have a light by your bed that is easy to reach.  Do not use any sheets or blankets that are too big for your bed. They should not hang down onto the floor.  Have a firm chair that has side arms. You can use this for support while you get dressed.  Do not have throw rugs and other things on the floor that can make you trip. What can I do in the kitchen?  Clean up any spills right away.  Avoid walking on wet floors.  Keep items that you use a lot in easy-to-reach places.  If you need to reach something above you, use a strong step stool that has a grab bar.  Keep electrical cords out of the way.  Do not use floor polish or wax that makes floors slippery. If you must use wax, use non-skid floor wax.  Do not have throw rugs and other things on the floor that can make you trip. What can I do with my stairs?  Do not leave any items on the stairs.  Make sure that there are handrails on both sides of the stairs and use them. Fix handrails that are broken or loose. Make sure that handrails are as long as the stairways.  Check any carpeting to make sure that it is firmly attached to the stairs. Fix any carpet that is loose or worn.  Avoid having throw rugs at the top or bottom of the stairs. If you do have throw rugs, attach them to the floor with carpet tape.  Make sure that you have a light switch at the top of the stairs and the bottom of the stairs. If you do not have them, ask someone to add them for you. What else can I do to help prevent falls?  Wear shoes that:  Do not have high heels.  Have rubber bottoms.  Are comfortable and fit you well.  Are closed at the toe. Do not wear sandals.  If  you use a stepladder:  Make sure that it is fully opened. Do not climb a closed stepladder.  Make sure that both sides of the stepladder are locked into place.  Ask someone to hold it for you, if possible.  Clearly mark and make sure that you can see:  Any grab bars or handrails.  First and last steps.  Where the edge of each step is.  Use tools that help you move around (mobility aids) if they are needed. These include:  Canes.  Walkers.  Scooters.  Crutches.  Turn on the lights when you go into a dark area. Replace any light bulbs as soon as they burn out.  Set up your furniture so you have a clear path. Avoid moving your furniture around.  If any of your floors are uneven, fix them.  If there are any pets around you, be aware of where they are.  Review your medicines with your doctor. Some medicines can make you feel dizzy. This can increase your chance of falling. Ask your doctor what other things that you can do to help prevent falls. This information is not intended to replace advice given to you by your health care provider. Make sure you discuss any questions you have with your health care provider. Document Released: 08/09/2009 Document Revised: 03/20/2016 Document Reviewed: 11/17/2014 Elsevier Interactive Patient Education  2017 [employer].

## 2018-12-06 ENCOUNTER — Encounter: Payer: Self-pay | Admitting: Family Medicine

## 2018-12-06 ENCOUNTER — Ambulatory Visit (INDEPENDENT_AMBULATORY_CARE_PROVIDER_SITE_OTHER): Payer: Medicare Other | Admitting: Family Medicine

## 2018-12-06 VITALS — BP 114/67 | HR 74 | Temp 98.4°F | Ht 68.0 in | Wt 121.0 lb

## 2018-12-06 DIAGNOSIS — N401 Enlarged prostate with lower urinary tract symptoms: Secondary | ICD-10-CM | POA: Diagnosis not present

## 2018-12-06 DIAGNOSIS — Z1329 Encounter for screening for other suspected endocrine disorder: Secondary | ICD-10-CM | POA: Diagnosis not present

## 2018-12-06 DIAGNOSIS — E440 Moderate protein-calorie malnutrition: Secondary | ICD-10-CM

## 2018-12-06 DIAGNOSIS — E7800 Pure hypercholesterolemia, unspecified: Secondary | ICD-10-CM | POA: Diagnosis not present

## 2018-12-06 DIAGNOSIS — D692 Other nonthrombocytopenic purpura: Secondary | ICD-10-CM

## 2018-12-06 DIAGNOSIS — J42 Unspecified chronic bronchitis: Secondary | ICD-10-CM

## 2018-12-06 DIAGNOSIS — E1169 Type 2 diabetes mellitus with other specified complication: Secondary | ICD-10-CM

## 2018-12-06 DIAGNOSIS — Z794 Long term (current) use of insulin: Secondary | ICD-10-CM

## 2018-12-06 DIAGNOSIS — E119 Type 2 diabetes mellitus without complications: Secondary | ICD-10-CM | POA: Diagnosis not present

## 2018-12-06 DIAGNOSIS — R350 Frequency of micturition: Secondary | ICD-10-CM

## 2018-12-06 DIAGNOSIS — Z0000 Encounter for general adult medical examination without abnormal findings: Secondary | ICD-10-CM

## 2018-12-06 DIAGNOSIS — Z7189 Other specified counseling: Secondary | ICD-10-CM

## 2018-12-06 DIAGNOSIS — F321 Major depressive disorder, single episode, moderate: Secondary | ICD-10-CM

## 2018-12-06 LAB — URINALYSIS, ROUTINE W REFLEX MICROSCOPIC
Bilirubin, UA: NEGATIVE
Glucose, UA: NEGATIVE
Ketones, UA: NEGATIVE
Leukocytes, UA: NEGATIVE
Nitrite, UA: NEGATIVE
Protein, UA: NEGATIVE
RBC, UA: NEGATIVE
Specific Gravity, UA: 1.01 (ref 1.005–1.030)
Urobilinogen, Ur: 0.2 mg/dL (ref 0.2–1.0)
pH, UA: 7 (ref 5.0–7.5)

## 2018-12-06 LAB — BAYER DCA HB A1C WAIVED: HB A1C (BAYER DCA - WAIVED): 9.6 % — ABNORMAL HIGH (ref <7.0)

## 2018-12-06 MED ORDER — EZETIMIBE-SIMVASTATIN 10-40 MG PO TABS: 1.0000 | ORAL_TABLET | Freq: Every day | ORAL | 4 refills | Status: DC

## 2018-12-06 MED ORDER — CITALOPRAM HYDROBROMIDE 20 MG PO TABS: 20.0000 mg | ORAL_TABLET | Freq: Every day | ORAL | 4 refills | Status: DC

## 2018-12-06 NOTE — Assessment & Plan Note (Signed)
A voluntary discussion about advanced care planning including explanation and discussion of advanced directives was extentively discussed with the patient.  Explained about the healthcare proxy and living will was reviewed and packet with forms with expiration of how to fill them out was given.  Time spent: Encounter 16+ min individuals present: Patient 

## 2018-12-06 NOTE — Assessment & Plan Note (Signed)
Poor control followed by endocrinology

## 2018-12-06 NOTE — Progress Notes (Signed)
BP 114/67   Pulse 74   Temp 98.4 F (36.9 C) (Oral)   Ht 5\' 8"  (1.727 m)   Wt 121 lb (54.9 kg)   SpO2 98%   BMI 18.40 kg/m    Subjective:    Patient ID: Colin Novak, male    DOB: 06/23/1946, 73 y.o.   MRN: [id number]  HPI: Colin Novak is a 73 y.o. male  Annual exam medical problems check  Patient followed by endocrinology for diabetes.  Maintains poor control A1c was checked here today. Has follow-up with endocrinology soon. Pression stable on citalopram. Weight has remained stable but patient still malnourished appearing and trying to eat. Cholesterol appears stable will get lab report.  Relevant past medical, surgical, family and social history reviewed and updated as indicated. Interim medical history since our last visit reviewed. Allergies and medications reviewed and updated.  Review of Systems  Constitutional: Negative.   HENT: Negative.   Eyes: Negative.   Respiratory: Negative.   Cardiovascular: Negative.   Gastrointestinal: Negative.   Endocrine: Negative.   Genitourinary: Negative.   Musculoskeletal: Negative.   Skin: Negative.   Allergic/Immunologic: Negative.   Neurological: Negative.   Hematological: Negative.   Psychiatric/Behavioral: Negative.     Per HPI unless specifically indicated above     Objective:    BP 114/67   Pulse 74   Temp 98.4 F (36.9 C) (Oral)   Ht 5\' 8"  (1.727 m)   Wt 121 lb (54.9 kg)   SpO2 98%   BMI 18.40 kg/m   Wt Readings from Last 3 Encounters:  12/06/18 121 lb (54.9 kg)  11/25/18 118 lb 9.6 oz (53.8 kg)  06/02/18 118 lb 4.8 oz (53.7 kg)    Physical Exam Constitutional:      Appearance: He is well-developed.  HENT:     Head: Normocephalic and atraumatic.     Right Ear: External ear normal.     Left Ear: External ear normal.  Eyes:     Conjunctiva/sclera: Conjunctivae normal.     Pupils: Pupils are equal, round, and reactive to light.  Neck:     Musculoskeletal: Normal range of motion and neck  supple.  Cardiovascular:     Rate and Rhythm: Normal rate and regular rhythm.     Heart sounds: Normal heart sounds.  Pulmonary:     Effort: Pulmonary effort is normal.     Breath sounds: Normal breath sounds.  Abdominal:     General: Bowel sounds are normal.     Palpations: Abdomen is soft. There is no hepatomegaly or splenomegaly.  Genitourinary:    Comments: No exam  Musculoskeletal: Normal range of motion.  Skin:    Findings: No erythema or rash.  Neurological:     Mental Status: He is alert and oriented to person, place, and time.     Deep Tendon Reflexes: Reflexes are normal and symmetric.  Psychiatric:        Behavior: Behavior normal.        Thought Content: Thought content normal.        Judgment: Judgment normal.     Results for orders placed or performed in visit on 07/05/18  Hemoglobin A1c  Result Value Ref Range   Hemoglobin A1C 9.4       Assessment & Plan:   Problem List Items Addressed This Visit      Cardiovascular and Mediastinum   Senile purpura ([place])    Stable  Respiratory   COPD (chronic obstructive pulmonary disease) ([facility])    Stable not using inhalers Still smoking and discussed stopping        Endocrine   Diabetes mellitus associated with hormonal etiology ([place]) - Primary    Poor control followed by endocrinology        Genitourinary   BPH (benign prostatic hyperplasia)     Other   Hyperlipidemia   Relevant Orders   CBC with Differential/Platelet   Comprehensive metabolic panel   Lipid panel   Urinalysis, Routine w reflex microscopic   Bayer DCA Hb A1c Waived   Depression, major, single episode, moderate ([facility])    The current medical regimen is effective;  continue present plan and medications.       Advanced care planning/counseling discussion    A voluntary discussion about advanced care planning including explanation and discussion of advanced directives was extentively discussed with the patient.  Explained about  the healthcare proxy and living will was reviewed and packet with forms with expiration of how to fill them out was given.  Time spent: Encounter 16+ min individuals present: Patient      Moderate protein-calorie malnutrition ([place])    Discussed diet nutrition       Other Visit Diagnoses    Thyroid disorder screening       Relevant Orders   TSH       Follow up plan: Return in about 6 months (around 06/06/2019) for BMP,  Lipids, ALT, AST.

## 2018-12-06 NOTE — Assessment & Plan Note (Signed)
Stable

## 2018-12-06 NOTE — Assessment & Plan Note (Signed)
Discussed diet nutrition

## 2018-12-06 NOTE — Assessment & Plan Note (Addendum)
Stable not using inhalers Still smoking and discussed stopping

## 2018-12-06 NOTE — Assessment & Plan Note (Signed)
The current medical regimen is effective;  continue present plan and medications.  

## 2018-12-07 ENCOUNTER — Encounter: Payer: Self-pay | Admitting: Family Medicine

## 2018-12-07 DIAGNOSIS — Z794 Long term (current) use of insulin: Secondary | ICD-10-CM | POA: Diagnosis not present

## 2018-12-07 DIAGNOSIS — E1165 Type 2 diabetes mellitus with hyperglycemia: Secondary | ICD-10-CM | POA: Diagnosis not present

## 2018-12-07 LAB — COMPREHENSIVE METABOLIC PANEL
ALT: 15 IU/L (ref 0–44)
AST: 16 IU/L (ref 0–40)
Albumin/Globulin Ratio: 1.8 (ref 1.2–2.2)
Albumin: 4.1 g/dL (ref 3.7–4.7)
Alkaline Phosphatase: 62 IU/L (ref 39–117)
BUN/Creatinine Ratio: 13 (ref 10–24)
BUN: 9 mg/dL (ref 8–27)
Bilirubin Total: <0.2 mg/dL (ref 0.0–1.2)
CO2: 25 mmol/L (ref 20–29)
Calcium: 9.4 mg/dL (ref 8.6–10.2)
Chloride: 99 mmol/L (ref 96–106)
Creatinine, Ser: 0.67 mg/dL — ABNORMAL LOW (ref 0.76–1.27)
GFR calc Af Amer: 110 mL/min/{1.73_m2} (ref >59)
GFR calc non Af Amer: 95 mL/min/{1.73_m2} (ref >59)
Globulin, Total: 2.3 g/dL (ref 1.5–4.5)
Glucose: 147 mg/dL — ABNORMAL HIGH (ref 65–99)
Potassium: 4.5 mmol/L (ref 3.5–5.2)
Sodium: 140 mmol/L (ref 134–144)
Total Protein: 6.4 g/dL (ref 6.0–8.5)

## 2018-12-07 LAB — CBC WITH DIFFERENTIAL/PLATELET
Basophils Absolute: 0.1 10*3/uL (ref 0.0–0.2)
Basos: 1 %
EOS (ABSOLUTE): 0.1 10*3/uL (ref 0.0–0.4)
Eos: 2 %
Hematocrit: 38.6 % (ref 37.5–51.0)
Hemoglobin: 13.2 g/dL (ref 13.0–17.7)
Immature Grans (Abs): 0 10*3/uL (ref 0.0–0.1)
Immature Granulocytes: 0 %
Lymphocytes Absolute: 1.6 10*3/uL (ref 0.7–3.1)
Lymphs: 41 %
MCH: 32.9 pg (ref 26.6–33.0)
MCHC: 34.2 g/dL (ref 31.5–35.7)
MCV: 96 fL (ref 79–97)
Monocytes Absolute: 0.5 10*3/uL (ref 0.1–0.9)
Monocytes: 13 %
Neutrophils Absolute: 1.7 10*3/uL (ref 1.4–7.0)
Neutrophils: 43 %
Platelets: 266 10*3/uL (ref 150–450)
RBC: 4.01 x10E6/uL — ABNORMAL LOW (ref 4.14–5.80)
RDW: 12.4 % (ref 11.6–15.4)
WBC: 4 10*3/uL (ref 3.4–10.8)

## 2018-12-07 LAB — LIPID PANEL
Chol/HDL Ratio: 3.3 ratio (ref 0.0–5.0)
Cholesterol, Total: 186 mg/dL (ref 100–199)
HDL: 57 mg/dL (ref >39)
LDL Calculated: 117 mg/dL — ABNORMAL HIGH (ref 0–99)
Triglycerides: 60 mg/dL (ref 0–149)
VLDL Cholesterol Cal: 12 mg/dL (ref 5–40)

## 2018-12-07 LAB — TSH: TSH: 1.39 u[IU]/mL (ref 0.450–4.500)

## 2018-12-14 DIAGNOSIS — F17200 Nicotine dependence, unspecified, uncomplicated: Secondary | ICD-10-CM | POA: Diagnosis not present

## 2018-12-14 DIAGNOSIS — Z794 Long term (current) use of insulin: Secondary | ICD-10-CM | POA: Diagnosis not present

## 2018-12-14 DIAGNOSIS — E1165 Type 2 diabetes mellitus with hyperglycemia: Secondary | ICD-10-CM | POA: Diagnosis not present

## 2019-01-03 DIAGNOSIS — F17200 Nicotine dependence, unspecified, uncomplicated: Secondary | ICD-10-CM | POA: Diagnosis not present

## 2019-01-03 DIAGNOSIS — E1165 Type 2 diabetes mellitus with hyperglycemia: Secondary | ICD-10-CM | POA: Diagnosis not present

## 2019-01-03 DIAGNOSIS — Z794 Long term (current) use of insulin: Secondary | ICD-10-CM | POA: Diagnosis not present

## 2019-03-16 DIAGNOSIS — F17200 Nicotine dependence, unspecified, uncomplicated: Secondary | ICD-10-CM | POA: Diagnosis not present

## 2019-03-16 DIAGNOSIS — R634 Abnormal weight loss: Secondary | ICD-10-CM | POA: Diagnosis not present

## 2019-03-16 DIAGNOSIS — Z794 Long term (current) use of insulin: Secondary | ICD-10-CM | POA: Diagnosis not present

## 2019-03-16 DIAGNOSIS — E1165 Type 2 diabetes mellitus with hyperglycemia: Secondary | ICD-10-CM | POA: Diagnosis not present

## 2019-05-16 DIAGNOSIS — E1165 Type 2 diabetes mellitus with hyperglycemia: Secondary | ICD-10-CM | POA: Diagnosis not present

## 2019-05-16 DIAGNOSIS — Z794 Long term (current) use of insulin: Secondary | ICD-10-CM | POA: Diagnosis not present

## 2019-05-23 DIAGNOSIS — R05 Cough: Secondary | ICD-10-CM | POA: Diagnosis not present

## 2019-05-23 DIAGNOSIS — E1165 Type 2 diabetes mellitus with hyperglycemia: Secondary | ICD-10-CM | POA: Diagnosis not present

## 2019-05-23 DIAGNOSIS — Z794 Long term (current) use of insulin: Secondary | ICD-10-CM | POA: Diagnosis not present

## 2019-05-23 DIAGNOSIS — F17200 Nicotine dependence, unspecified, uncomplicated: Secondary | ICD-10-CM | POA: Diagnosis not present

## 2019-05-25 ENCOUNTER — Other Ambulatory Visit: Payer: Self-pay | Admitting: Internal Medicine

## 2019-05-25 DIAGNOSIS — R918 Other nonspecific abnormal finding of lung field: Secondary | ICD-10-CM

## 2019-06-01 ENCOUNTER — Ambulatory Visit
Admission: RE | Admit: 2019-06-01 | Discharge: 2019-06-01 | Disposition: A | Discharge status: Home / Self Care | Payer: Medicare Other | Source: Ambulatory Visit | Attending: Internal Medicine | Admitting: Internal Medicine

## 2019-06-01 ENCOUNTER — Encounter (INDEPENDENT_AMBULATORY_CARE_PROVIDER_SITE_OTHER): Payer: Self-pay

## 2019-06-01 ENCOUNTER — Other Ambulatory Visit: Payer: Self-pay

## 2019-06-01 DIAGNOSIS — R918 Other nonspecific abnormal finding of lung field: Secondary | ICD-10-CM | POA: Diagnosis not present

## 2019-06-01 DIAGNOSIS — J984 Other disorders of lung: Secondary | ICD-10-CM | POA: Diagnosis not present

## 2019-06-01 MED ORDER — IOHEXOL 300 MG/ML  SOLN
75.0000 mL | Freq: Once | INTRAMUSCULAR | Status: AC | PRN
  Administered 2019-06-01: 75 mL via INTRAVENOUS

## 2019-06-02 ENCOUNTER — Telehealth: Payer: Self-pay | Admitting: Family Medicine

## 2019-06-02 NOTE — Telephone Encounter (Signed)
Called pt for a 2nd time to reschedule 8/10 appt no answer vm not set up

## 2019-06-06 ENCOUNTER — Ambulatory Visit: Payer: Medicare Other | Admitting: Family Medicine

## 2019-06-06 ENCOUNTER — Other Ambulatory Visit: Payer: Self-pay

## 2019-06-07 ENCOUNTER — Other Ambulatory Visit: Payer: Self-pay

## 2019-06-07 ENCOUNTER — Inpatient Hospital Stay: Payer: Medicare Other | Attending: Oncology | Admitting: Oncology

## 2019-06-07 ENCOUNTER — Inpatient Hospital Stay: Payer: Medicare Other

## 2019-06-07 ENCOUNTER — Encounter: Payer: Self-pay | Admitting: Oncology

## 2019-06-07 ENCOUNTER — Emergency Department
Admission: EM | Admit: 2019-06-07 | Discharge: 2019-06-07 | Disposition: A | Discharge status: Home / Self Care | Payer: Medicare Other | Attending: Emergency Medicine | Admitting: Emergency Medicine

## 2019-06-07 ENCOUNTER — Encounter: Payer: Self-pay | Admitting: Emergency Medicine

## 2019-06-07 VITALS — BP 85/65 | HR 89 | Temp 96.9°F | Resp 16 | Ht 69.09 in | Wt 106.4 lb

## 2019-06-07 DIAGNOSIS — R634 Abnormal weight loss: Secondary | ICD-10-CM

## 2019-06-07 DIAGNOSIS — Z79899 Other long term (current) drug therapy: Secondary | ICD-10-CM

## 2019-06-07 DIAGNOSIS — Z794 Long term (current) use of insulin: Secondary | ICD-10-CM | POA: Diagnosis not present

## 2019-06-07 DIAGNOSIS — Z7982 Long term (current) use of aspirin: Secondary | ICD-10-CM | POA: Diagnosis not present

## 2019-06-07 DIAGNOSIS — E861 Hypovolemia: Secondary | ICD-10-CM | POA: Diagnosis not present

## 2019-06-07 DIAGNOSIS — E785 Hyperlipidemia, unspecified: Secondary | ICD-10-CM

## 2019-06-07 DIAGNOSIS — R630 Anorexia: Secondary | ICD-10-CM | POA: Insufficient documentation

## 2019-06-07 DIAGNOSIS — E860 Dehydration: Secondary | ICD-10-CM | POA: Insufficient documentation

## 2019-06-07 DIAGNOSIS — I712 Thoracic aortic aneurysm, without rupture: Secondary | ICD-10-CM | POA: Insufficient documentation

## 2019-06-07 DIAGNOSIS — F17210 Nicotine dependence, cigarettes, uncomplicated: Secondary | ICD-10-CM | POA: Diagnosis not present

## 2019-06-07 DIAGNOSIS — F419 Anxiety disorder, unspecified: Secondary | ICD-10-CM | POA: Insufficient documentation

## 2019-06-07 DIAGNOSIS — I9589 Other hypotension: Secondary | ICD-10-CM | POA: Insufficient documentation

## 2019-06-07 DIAGNOSIS — R918 Other nonspecific abnormal finding of lung field: Secondary | ICD-10-CM | POA: Insufficient documentation

## 2019-06-07 DIAGNOSIS — Z515 Encounter for palliative care: Secondary | ICD-10-CM | POA: Insufficient documentation

## 2019-06-07 DIAGNOSIS — E118 Type 2 diabetes mellitus with unspecified complications: Secondary | ICD-10-CM | POA: Insufficient documentation

## 2019-06-07 DIAGNOSIS — R591 Generalized enlarged lymph nodes: Secondary | ICD-10-CM

## 2019-06-07 DIAGNOSIS — I959 Hypotension, unspecified: Secondary | ICD-10-CM | POA: Diagnosis not present

## 2019-06-07 DIAGNOSIS — R64 Cachexia: Secondary | ICD-10-CM | POA: Diagnosis not present

## 2019-06-07 DIAGNOSIS — E43 Unspecified severe protein-calorie malnutrition: Secondary | ICD-10-CM | POA: Insufficient documentation

## 2019-06-07 DIAGNOSIS — E119 Type 2 diabetes mellitus without complications: Secondary | ICD-10-CM

## 2019-06-07 DIAGNOSIS — J449 Chronic obstructive pulmonary disease, unspecified: Secondary | ICD-10-CM | POA: Diagnosis not present

## 2019-06-07 DIAGNOSIS — I871 Compression of vein: Secondary | ICD-10-CM | POA: Diagnosis not present

## 2019-06-07 DIAGNOSIS — E1165 Type 2 diabetes mellitus with hyperglycemia: Secondary | ICD-10-CM | POA: Diagnosis not present

## 2019-06-07 DIAGNOSIS — R739 Hyperglycemia, unspecified: Secondary | ICD-10-CM

## 2019-06-07 DIAGNOSIS — R590 Localized enlarged lymph nodes: Secondary | ICD-10-CM | POA: Insufficient documentation

## 2019-06-07 DIAGNOSIS — R0602 Shortness of breath: Secondary | ICD-10-CM

## 2019-06-07 DIAGNOSIS — R05 Cough: Secondary | ICD-10-CM

## 2019-06-07 DIAGNOSIS — K219 Gastro-esophageal reflux disease without esophagitis: Secondary | ICD-10-CM | POA: Insufficient documentation

## 2019-06-07 DIAGNOSIS — C3490 Malignant neoplasm of unspecified part of unspecified bronchus or lung: Secondary | ICD-10-CM | POA: Insufficient documentation

## 2019-06-07 LAB — CBC WITH DIFFERENTIAL/PLATELET
Abs Immature Granulocytes: 0.01 10*3/uL (ref 0.00–0.07)
Basophils Absolute: 0 10*3/uL (ref 0.0–0.1)
Basophils Relative: 1 %
Eosinophils Absolute: 0 10*3/uL (ref 0.0–0.5)
Eosinophils Relative: 0 %
HCT: 41.3 % (ref 39.0–52.0)
Hemoglobin: 14.5 g/dL (ref 13.0–17.0)
Immature Granulocytes: 0 %
Lymphocytes Relative: 26 %
Lymphs Abs: 1.6 10*3/uL (ref 0.7–4.0)
MCH: 33.6 pg (ref 26.0–34.0)
MCHC: 35.1 g/dL (ref 30.0–36.0)
MCV: 95.6 fL (ref 80.0–100.0)
Monocytes Absolute: 0.6 10*3/uL (ref 0.1–1.0)
Monocytes Relative: 9 %
Neutro Abs: 3.9 10*3/uL (ref 1.7–7.7)
Neutrophils Relative %: 64 %
Platelets: 219 10*3/uL (ref 150–400)
RBC: 4.32 MIL/uL (ref 4.22–5.81)
RDW: 12.2 % (ref 11.5–15.5)
WBC: 6.1 10*3/uL (ref 4.0–10.5)
nRBC: 0 % (ref 0.0–0.2)

## 2019-06-07 LAB — COMPREHENSIVE METABOLIC PANEL
ALT: 37 U/L (ref 0–44)
AST: 26 U/L (ref 15–41)
Albumin: 4.3 g/dL (ref 3.5–5.0)
Alkaline Phosphatase: 77 U/L (ref 38–126)
Anion gap: 10 (ref 5–15)
BUN: 15 mg/dL (ref 8–23)
CO2: 27 mmol/L (ref 22–32)
Calcium: 9.3 mg/dL (ref 8.9–10.3)
Chloride: 95 mmol/L — ABNORMAL LOW (ref 98–111)
Creatinine, Ser: 0.73 mg/dL (ref 0.61–1.24)
GFR calc Af Amer: >60 mL/min (ref >60)
GFR calc non Af Amer: >60 mL/min (ref >60)
Glucose, Bld: 416 mg/dL — ABNORMAL HIGH (ref 70–99)
Potassium: 4.6 mmol/L (ref 3.5–5.1)
Sodium: 132 mmol/L — ABNORMAL LOW (ref 135–145)
Total Bilirubin: 0.7 mg/dL (ref 0.3–1.2)
Total Protein: 7.3 g/dL (ref 6.5–8.1)

## 2019-06-07 LAB — LACTATE DEHYDROGENASE: LDH: 166 U/L (ref 98–192)

## 2019-06-07 LAB — URIC ACID: Uric Acid, Serum: 3.1 mg/dL — ABNORMAL LOW (ref 3.7–8.6)

## 2019-06-07 MED ORDER — SODIUM CHLORIDE 0.9 % IV BOLUS
1000.0000 mL | Freq: Once | INTRAVENOUS | Status: AC
  Administered 2019-06-07: 1000 mL via INTRAVENOUS

## 2019-06-07 NOTE — Progress Notes (Signed)
Patient reports having a productive cough for "quite a while".  Had a Chest CT on 06/01/19.   Also states he has lost and last recorded weight in 11/2018 was 121 with weight of 106.4 today.  BP is low today at 85/65 and he says he always runs low.

## 2019-06-07 NOTE — ED Notes (Signed)
Orthostatic vital signs complete

## 2019-06-07 NOTE — ED Provider Notes (Signed)
[facility] Emergency Department Provider Note   ____________________________________________    I have reviewed the triage vital signs and the nursing notes.   HISTORY  Chief Complaint Hypotension     HPI Colin Novak is a 73 y.o. male with a history of COPD, diabetes sent in the cancer center for evaluation of hypotension.  Patient reports he is feeling well, denies dizziness.  No chest pain.  No nausea or vomiting or diarrhea.  He reports that his blood pressure "runs low ".  Review of medical records demonstrates that he typically is around 110/70.  Had a blood pressure of 85 systolic at the cancer center.  He has no physical complaints at this time  Past Medical History:  Diagnosis Date  . Anxiety   . COPD (chronic obstructive pulmonary disease) ([place])   . Diabetes mellitus without complication ([place])    type 2  . Hyperlipidemia     Patient Active Problem List   Diagnosis Date Noted  . Moderate protein-calorie malnutrition ([place]) 05/24/2018  . Tobacco dependence 05/24/2018  . Underweight 05/24/2018  . Weight loss, unintentional 05/24/2018  . Edema extremities 11/19/2017  . Depression, major, single episode, moderate ([place]) 11/19/2017  . Advanced care planning/counseling discussion 11/19/2017  . Senile purpura ([place]) 04/01/2017  . COPD (chronic obstructive pulmonary disease) ([place]) 07/24/2015  . Malnutrition ([place]) 07/24/2015  . Skin lesion 07/24/2015  . BPH (benign prostatic hyperplasia) 07/24/2015  . Diabetes mellitus associated with hormonal etiology ([place]) 04/11/2015  . Hyperlipidemia 04/11/2015    Past Surgical History:  Procedure Laterality Date  . APPENDECTOMY    . EYE SURGERY Bilateral    cataract    Prior to Admission medications   Medication Sig Start Date End Date Taking? Authorizing Provider  aspirin EC 81 MG tablet Take 81 mg by mouth daily.   Yes [provider]  citalopram (CELEXA) 20 MG tablet Take 1 tablet  (20 mg total) by mouth daily. 12/06/18  Yes Crissman, Jeannette How, MD  Dextromethorphan-guaiFENesin (ROBITUSSIN DM PO) Take 5 mLs by mouth at bedtime.    Yes [provider]  ezetimibe-simvastatin (VYTORIN) 10-40 MG tablet Take 1 tablet by mouth daily. 12/06/18  Yes Crissman, Jeannette How, MD  glucose blood (ONE TOUCH ULTRA TEST) test strip 1 each by Other route 2 (two) times daily as needed for other. Dx: E11.9 07/19/15  Yes Crissman, Jeannette How, MD  LANTUS SOLOSTAR 100 UNIT/ML Solostar Pen INJECT 10-20 UNITS INTO THE SKIN DAILY Patient taking differently: Inject 12 Units into the skin daily.  04/27/17  Yes Crissman, Jeannette How, MD  metFORMIN (GLUCOPHAGE) 500 MG tablet TAKE ONE TABLET BY MOUTH TWICE DAILY Patient taking differently: Take 1,000 mg by mouth 2 (two) times daily.  12/22/16  Yes Crissman, Jeannette How, MD  Multiple Vitamin (MULTIVITAMIN) tablet Take 1 tablet by mouth daily.   Yes [provider]  NOVOFINE 32G X 6 MM MISC USE AS DIRECTED. 5 INJECTIONS A DAY 05/05/16  Yes Johnson, Megan P, DO  oxymetazoline (AFRIN) 0.05 % nasal spray Place 2 sprays into both nostrils 2 (two) times daily as needed.    Yes [provider]  pioglitazone (ACTOS) 30 MG tablet Take 30 mg by mouth daily.  10/26/17  Yes [provider]  pseudoephedrine-acetaminophen (TYLENOL SINUS) 30-500 MG TABS tablet Take 2 tablets by mouth every 4 (four) hours as needed. Only using twice daily   Yes [provider]     Allergies Penicillins  Family History  Problem Relation Age of Onset  . Cancer Mother        lung (non smoker)  . Diabetes Father   . Hypertension Father   . Lymphoma Brother   . Prostate cancer Brother     Social History Social History   Tobacco Use  . Smoking status: Current Every Day Smoker    Packs/day: 2.00    Types: Cigarettes  . Smokeless tobacco: Never Used  Substance Use Topics  . Alcohol use: No  . Drug use: No    Review of Systems  Constitutional: No  fever/chills Eyes: No visual changes.  ENT: No sore throat. Cardiovascular: Denies chest pain. Respiratory: Denies shortness of breath. Gastrointestinal: No abdominal pain.   Genitourinary: Negative for dysuria. Musculoskeletal: Negative for back pain. Skin: Negative for rash. Neurological: Negative for headaches or weakness   ____________________________________________   PHYSICAL EXAM:  VITAL SIGNS: ED Triage Vitals  Enc Vitals Group     BP 06/07/19 1227 (!) 94/55     Pulse Rate 06/07/19 1227 74     Resp 06/07/19 1227 18     Temp 06/07/19 1231 97.8 F (36.6 C)     Temp Source 06/07/19 1227 Temporal     SpO2 06/07/19 1227 100 %     Weight 06/07/19 1227 48.3 kg (106 lb 6.4 oz)     Height 06/07/19 1227 1.753 m (5\' 9" )     Head Circumference --      Peak Flow --      Pain Score 06/07/19 1227 0     Pain Loc --      Pain Edu? --      Excl. in [place]? --     Constitutional: Alert and oriented. No acute distress.    Nose: No congestion/rhinnorhea. Mouth/Throat: Mucous membranes are moist.    Cardiovascular: Normal rate, regular rhythm.  Good peripheral circulation. Respiratory: Normal respiratory effort.  No retractions.  Gastrointestinal: Soft and nontender. No distention.    Musculoskeletal: No lower extremity tenderness nor edema.  Warm and well perfused Neurologic:  Normal speech and language. No gross focal neurologic deficits are appreciated.  Skin:  Skin is warm, dry and intact. No rash noted. Psychiatric: Mood and affect are normal. Speech and behavior are normal.  ____________________________________________   LABS (all labs ordered are listed, but only abnormal results are displayed)  Labs Reviewed - No data to display ____________________________________________  EKG  None ____________________________________________  RADIOLOGY  None ____________________________________________   PROCEDURES  Procedure(s) performed: No  Procedures    Critical Care performed: No ____________________________________________   INITIAL IMPRESSION / ASSESSMENT AND PLAN / ED COURSE  Pertinent labs & imaging results that were available during my care of the patient were reviewed by me and considered in my medical decision making (see chart for details).  Patient well-appearing and in no acute distress.  He is asymptomatic, may be mildly dehydrated, will give IV fluids, recheck blood pressure.  Lab work done today at the cancer center overall reassuring, the patient is hyperglycemic which may be causing his dehydration.  After IV fluids the patient's blood pressure is normalized.  Will perform orthostatics.  Orthostatics normal.  Patient feels quite well, appropriate for discharge at this time    ____________________________________________   FINAL CLINICAL IMPRESSION(S) / ED DIAGNOSES  Final diagnoses:  Dehydration  Hypotension due to hypovolemia  Hyperglycemia        Note:  This document was prepared using Dragon voice recognition software and may include unintentional dictation errors.  Lavonia Drafts, MD 06/07/19 1454

## 2019-06-07 NOTE — Progress Notes (Addendum)
Hematology/Oncology Consult note [facility] Telephone:([phone] [phone]   Patient Care Team: Guadalupe Maple, MD as PCP - General (Family Medicine) Gabriel Carina, Betsey Holiday, MD as Physician Assistant (Endocrinology) Kem Parkinson, MD (Ophthalmology)  REFERRING PROVIDER: Gabriel Carina Betsey Holiday, MD  CHIEF COMPLAINTS/REASON FOR VISIT:  Evaluation of lung mass  HISTORY OF PRESENTING ILLNESS:   Colin Novak is a  73 y.o.  male with PMH listed below was seen in consultation at the request of  Solum, Betsey Holiday, MD  for evaluation of lung mass Patient reports ongoing shortness of breath, cough for  few months. He had CT chest scan done on 06/01/2019 which showed a concerning feature of 9.1 x 5.1 x 3.6 cm right upper lobe mass extending into mediastinum, encasing and occluding right upper lobe bronchus and some of the right upper lobe bronchial branches.  There is also significant narrowing of FVC. He also had a similar appearance of left upper lobe mass without mediastinal extension suspicious for a synchronized primary lung cancer Multiple additional smaller bilateral lung nodules, left greater than right. Mildly enlarged right hilar and mediastinal lymph nodes suspicious for metastatic lymphadenopathy. 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  Patient endorses decreased appetite, significant weight loss. He feels weak and shaky.  Also reports some middle chest discomfort, no exacerbating or alleviating factors. Lives by himself. Daughter Colin Novak is his power of attorney. Denies fever or chills.     Review of Systems  Constitutional: Positive for appetite change, fatigue and unexpected weight change.  HENT:   Negative for lump/mass.   Eyes: Negative for eye problems.  Respiratory: Positive for cough and shortness of breath.   Cardiovascular: Negative for leg  swelling.  Gastrointestinal: Negative for abdominal distention.  Genitourinary: Negative for frequency.   Musculoskeletal: Negative for back pain.  Skin: Negative for rash.  Neurological: Positive for light-headedness. Negative for headaches.  Hematological: Negative for adenopathy.  Psychiatric/Behavioral: Negative for confusion. The patient is not nervous/anxious.     MEDICAL HISTORY:  Past Medical History:  Diagnosis Date   Anxiety    COPD (chronic obstructive pulmonary disease) ([place])    Diabetes mellitus without complication ([place])    type 2   Hyperlipidemia     SURGICAL HISTORY: Past Surgical History:  Procedure Laterality Date   APPENDECTOMY     EYE SURGERY Bilateral    cataract    SOCIAL HISTORY: Social History   Socioeconomic History   Marital status: Widowed    Spouse name: Not on file   Number of children: 1   Years of education: Not on file   Highest education level: High school graduate  Occupational History   Occupation: retired  Scientist, product/process development strain: Not very hard   Food insecurity    Worry: Never true    Inability: Never true   Transportation needs    Medical: No    Non-medical: No  Tobacco Use   Smoking status: Current Every Day Smoker    Packs/day: 2.00    Types: Cigarettes   Smokeless tobacco: Never Used  Substance and Sexual Activity   Alcohol use: No   Drug use: No   Sexual activity: Not Currently  Lifestyle   Physical activity    Days per week: 0 days    Minutes per session: 0 min   Stress: Not at all  Relationships   Social connections  Talks on phone: Once a week    Gets together: Never    Attends religious service: Never    Active member of club or organization: No    Attends meetings of clubs or organizations: Never    Relationship status: Widowed   Intimate partner violence    Fear of current or ex partner: No    Emotionally abused: No    Physically abused: No    Forced  sexual activity: No  Other Topics Concern   Not on file  Social History Narrative   Not on file    FAMILY HISTORY: Family History  Problem Relation Age of Onset   Cancer Mother        lung (non smoker)   Diabetes Father    Hypertension Father    Lymphoma Brother    Prostate cancer Brother     ALLERGIES:  is allergic to penicillins.  MEDICATIONS:  Current Outpatient Medications  Medication Sig Dispense Refill   aspirin EC 81 MG tablet Take 81 mg by mouth daily.     citalopram (CELEXA) 20 MG tablet Take 1 tablet (20 mg total) by mouth daily. 90 tablet 4   ezetimibe-simvastatin (VYTORIN) 10-40 MG tablet Take 1 tablet by mouth daily. 90 tablet 4   glucose blood (ONE TOUCH ULTRA TEST) test strip 1 each by Other route 2 (two) times daily as needed for other. Dx: E11.9 100 each 12   LANTUS SOLOSTAR 100 UNIT/ML Solostar Pen INJECT 10-20 UNITS INTO THE SKIN DAILY (Patient taking differently: Inject 20 Units into the skin daily. ) 5 pen 12   metFORMIN (GLUCOPHAGE) 500 MG tablet TAKE ONE TABLET BY MOUTH TWICE DAILY (Patient taking differently: 4 QD) 60 tablet 12   Multiple Vitamin (MULTIVITAMIN) tablet Take 1 tablet by mouth daily.     NOVOFINE 32G X 6 MM MISC USE AS DIRECTED. 5 INJECTIONS A DAY 900 each 0   oxymetazoline (AFRIN) 0.05 % nasal spray Place 2 sprays into both nostrils 2 (two) times daily. Only using once daily in AM     pioglitazone (ACTOS) 30 MG tablet Take 30 mg by mouth daily.   1   pseudoephedrine-acetaminophen (TYLENOL SINUS) 30-500 MG TABS tablet Take 2 tablets by mouth every 4 (four) hours as needed. Only using twice daily     Dextromethorphan-guaiFENesin (ROBITUSSIN DM PO) Take by mouth.     No current facility-administered medications for this visit.      PHYSICAL EXAMINATION: ECOG PERFORMANCE STATUS: 2 - Symptomatic, <50% confined to bed Vitals:   06/07/19 1113  BP: (!) 85/65  Pulse: 89  Resp: 16  Temp: (!) 96.9 F (36.1 C)  SpO2: 96%     Filed Weights   06/07/19 1113  Weight: 106 lb 6.4 oz (48.3 kg)    Physical Exam Constitutional:      General: He is not in acute distress.    Appearance: He is ill-appearing.     Comments: Cachectic  HENT:     Head: Normocephalic and atraumatic.  Eyes:     General: No scleral icterus.    Pupils: Pupils are equal, round, and reactive to light.  Neck:     Musculoskeletal: Normal range of motion and neck supple.     Comments: No neck swelling Cardiovascular:     Rate and Rhythm: Normal rate and regular rhythm.     Heart sounds: Normal heart sounds.  Pulmonary:     Effort: Pulmonary effort is normal. No respiratory distress.  Comments: Bibasilar crackles Abdominal:     General: Bowel sounds are normal. There is no distension.     Palpations: Abdomen is soft. There is no mass.  Musculoskeletal: Normal range of motion.        General: No deformity.  Skin:    General: Skin is warm and dry.     Findings: No erythema or rash.  Neurological:     General: No focal deficit present.     Mental Status: He is alert and oriented to person, place, and time.     Cranial Nerves: No cranial nerve deficit.     Coordination: Coordination normal.  Psychiatric:        Mood and Affect: Mood normal.   Gait was unsteady.  LABORATORY DATA:  I have reviewed the data as listed Lab Results  Component Value Date   WBC 6.1 06/07/2019   HGB 14.5 06/07/2019   HCT 41.3 06/07/2019   MCV 95.6 06/07/2019   PLT 219 06/07/2019   Recent Labs    12/06/18 0857 06/07/19 1157  NA 140 132*  K 4.5 4.6  CL 99 95*  CO2 25 27  GLUCOSE 147* 416*  BUN 9 15  CREATININE 0.67* 0.73  CALCIUM 9.4 9.3  GFRNONAA 95 >60  GFRAA 110 >60  PROT 6.4 7.3  ALBUMIN 4.1 4.3  AST 16 26  ALT 15 37  ALKPHOS 62 77  BILITOT <0.2 0.7   Iron/TIBC/Ferritin/ %Sat No results found for: IRON, TIBC, FERRITIN, IRONPCTSAT    RADIOGRAPHIC STUDIES: I have personally reviewed the radiological images as listed and  agreed with the findings in the report.  Ct Chest W Contrast  Addendum Date: 06/01/2019   ADDENDUM REPORT: 06/01/2019 16:25 ADDENDUM: 4.1 cm ascending thoracic aortic aneurysm and aneurysmal dilatation of the descending thoracic aorta measuring 4.0 cm. Recommend annual imaging followup by CTA or MRA. This recommendation follows 2010 ACCF/AHA/AATS/ACR/ASA/SCA/SCAI/SIR/STS/SVM Guidelines for the Diagnosis and Management of Patients with Thoracic Aortic Disease. Circulation. 2010; 121: L976-B341. Aortic aneurysm NOS (ICD10-I71.9) Electronically Signed   By: Claudie Revering M.D.   On: 06/01/2019 16:25   Result Date: 06/01/2019 CLINICAL DATA:  Smoker. Medial right upper lobe mass or right paratracheal adenopathy on recent chest radiographs at [facility]. EXAM: CT CHEST WITH CONTRAST TECHNIQUE: Multidetector CT imaging of the chest was performed during intravenous contrast administration. CONTRAST:  49mL OMNIPAQUE IOHEXOL 300 MG/ML  SOLN COMPARISON:  Chest CT report dated 07/25/2013. Chest radiographs report from [facility] on 05/23/2019. FINDINGS: Cardiovascular: Atheromatous calcifications, including the coronary arteries and aorta. Mildly dilated ascending thoracic aorta with a maximum diameter of 4.1 cm on image number 83 series 2. The descending thoracic aorta is also mildly dilated, measuring 4.0 cm on image number 114 series 2. The aortic arch is less prominent, measuring 3.3 cm on image number 54 series 2. Mediastinum/Nodes: Heterogeneous mass in medial aspect of the right upper lobe and extending into the adjacent mediastinum, encasing and causing marked narrowing of the superior vena cava. This mass measures 5.1 x 3.6 cm on image number 54 series 2. This measures 9.1 cm in length on coronal image number 47. This is encasing and occluding the right upper lobe bronchus and some of the right upper lobe bronchial branches. A precarinal node is mildly enlarged with a short axis diameter of 9 mm on image number 68 series 2. A  right hilar node is enlarged with a short axis diameter 11 mm on image number 79 series 2. Lungs/Pleura: Extensive bilateral bullous changes.  Previously described 9.1 x 5.1 x 3.6 cm right upper lobe mass extending into the mediastinum. Similar-appearing left upper lobe mass without mediastinal extension. This measures 2.3 x 2.2 cm on image number 35 series 3 and 4.1 cm in length on coronal image number 64. Multiple additional smaller nodules in both lungs, left greater than right. The next largest is in the left lower lobe, measuring 1.4 x 0.9 cm on image number 82 series 3. No pleural fluid. Upper Abdomen: Diffuse low density of the liver relative to the spleen. Mild diffuse bilateral adrenal hypertrophy with no focal adrenal mass is seen. Small left lobe liver cyst. Musculoskeletal: Approximately 25% old L2 vertebral compression deformity with no acute fracture lines. Approximately 10% old T12 superior endplate compression deformity with Schmorl's node formation and no acute fracture lines. IMPRESSION: 1. 9.1 x 5.1 x 3.6 cm right upper lobe mass extending into the mediastinum, encasing and occluding the right upper lobe bronchus and some of the right upper lobe bronchial branches. This is compatible with a primary lung carcinoma. 2. Similar-appearing left upper lobe mass without mediastinal extension suspicious for a synchronous primary lung cancer. 3. Multiple additional smaller bilateral lung nodules, left greater than right. These could represent metastases and possible additional synchronous primary lung carcinomas. 4. Mildly enlarged right hilar and mediastinal lymph nodes, suspicious for metastatic adenopathy. 5. Extensive emphysematous changes. 6.  Calcific coronary artery and aortic atherosclerosis. 7. Diffuse hepatic steatosis. 8. Old T12 and L2 vertebral compression fractures. Aortic Atherosclerosis (ICD10-I70.0) and Emphysema (ICD10-J43.9). Electronically Signed: By: Claudie Revering M.D. On: 06/01/2019  16:15      ASSESSMENT & PLAN:  1. Lung mass   2. Other specified hypotension   3. SVC obstruction   4. Cachexia [facility])    Images were independently reviewed and discussed with patient. With patient's permission, I also called patient's power of attorney daughter Colin Novak and discussed with her. I am very concerned that patient has underlying malignancy, i.e. primary lung cancer.  In the clinic, patient appears to be hypotensive, unsteady gait due to lightheadedness.  Almost fell in the clinic. On image, he has significant narrowing of SVC due to the compression from large right upper lobe mass. Recommend patient to go to emergency room for evaluation and management of hypotension.  Recommend consult vascular surgeon for need of stenting. Recommend admission to speed up work-up process given the aggressive feature of underlying malignancy.  Obtain abdomen and pelvis CT.  If no additional biopsy options, recommend consulting pulmonology for evaluation of biopsy. Patient and daughter agree with the plan. I called emergency room and discussed with the triage nurse.  #Addendum, 3:59 PM, reviewed patient's chart.  Patient had been discharged by emergency room already.  He got fluid there and the blood pressure improved.  Released home. I called patient's daughter.  We will proceed with outpatient work-up for lung mass. Severe malnutrition cachexia/refer to dietitian. Orders Placed This Encounter  Procedures   NM PET Image Initial (PI) Skull Base To Thigh    Standing Status:   Future    Standing Expiration Date:   06/06/2020    Order Specific Question:   If indicated for the ordered procedure, I authorize the administration of a radiopharmaceutical per Radiology protocol    Answer:   Yes    Order Specific Question:   Preferred imaging location?    Answer:   [facility]    Order Specific Question:   Radiology Contrast Protocol - do NOT remove file path  Answer:    \[email or web address]   MR Brain W Wo Contrast    Standing Status:   Future    Standing Expiration Date:   06/06/2020    Order Specific Question:   If indicated for the ordered procedure, I authorize the administration of contrast media per Radiology protocol    Answer:   Yes    Order Specific Question:   What is the patient's sedation requirement?    Answer:   No Sedation    Order Specific Question:   Does the patient have a pacemaker or implanted devices?    Answer:   No    Order Specific Question:   Use SRS Protocol?    Answer:   No    Order Specific Question:   Radiology Contrast Protocol - do NOT remove file path    Answer:   \[email or web address]    Order Specific Question:   Preferred imaging location?    Answer:   [facility] (table limit-400lbs)   CBC with Differential/Platelet    Standing Status:   Future    Number of Occurrences:   1    Standing Expiration Date:   06/06/2020   Comprehensive metabolic panel    Standing Status:   Future    Number of Occurrences:   1    Standing Expiration Date:   06/06/2020   Lactate dehydrogenase    Standing Status:   Future    Number of Occurrences:   1    Standing Expiration Date:   06/06/2020   Uric acid    Standing Status:   Future    Number of Occurrences:   1    Standing Expiration Date:   06/06/2020    All questions were answered. The patient knows to call the clinic with any problems questions or concerns.  cc Solum, Betsey Holiday, MD    Return of visit: To be determined Thank you for this kind referral and the opportunity to participate in the care of this patient. A copy of today's note is routed to referring provider  Total face to face encounter time for this patient visit was 70 min. >50% of the time was  spent in counseling and coordination of care.    Earlie Server, MD, PhD Hematology Oncology [facility] at [facility] Pager- [phone] 06/07/2019

## 2019-06-07 NOTE — ED Notes (Signed)
First Nurse Note; Per Dr. Tasia Catchings, Oncology, pt is being seen by her for the first time for right upper lobe mass, narrowing SVC.  Pt being sent over for further evaluation due to hypotension, BP 88/63 in office.  Pt alert upon arrival, NAD noted at this time.

## 2019-06-07 NOTE — ED Notes (Signed)
VORB from Dr. Corky Downs, lab results available from cancer center.

## 2019-06-07 NOTE — ED Notes (Signed)
Pt placed on cardiac monitor and call light within reach. Pt instructed to use call light for any needs.

## 2019-06-07 NOTE — ED Triage Notes (Signed)
Pt presents to ED via wheelchair from the cancer center for hypotension. Pt being evaluated at cancer center for R upper lobe mass, today was seen and BP 85/65 at cancer center, sent to ED for further evaluation.

## 2019-06-08 ENCOUNTER — Encounter: Payer: Self-pay | Admitting: *Deleted

## 2019-06-08 DIAGNOSIS — R918 Other nonspecific abnormal finding of lung field: Secondary | ICD-10-CM

## 2019-06-08 NOTE — Progress Notes (Addendum)
  Oncology Nurse Navigator Documentation  Navigator Location: [facility] (06/08/19 1000) Referral Date to RadOnc/MedOnc: 06/03/19 (06/08/19 1000) )Navigator Encounter Type: Introductory Phone Call (06/08/19 1000)   Abnormal Finding Date: 06/01/19 (06/08/19 1000)                   Treatment Phase: Abnormal Scans (06/08/19 1000) Barriers/Navigation Needs: Coordination of Care (06/08/19 1000)   Interventions: Coordination of Care (06/08/19 1000)   Coordination of Care: Appts;Broncoscopy (06/08/19 1000)        Acuity: Level 2 (06/08/19 1000)   Acuity Level 2: Initial guidance, education and coordination as needed;Educational needs;Assistance expediting appointments (06/08/19 1000)  phone call made to patient's daughter to introduce to navigator services. All questions answered during phone call. Reviewed upcoming appts with patient's daughter. Contact info given and instructed to call with further questions or needs. Pt's daughter verbalized understanding. Nothing further needed at this time.    Time Spent with Patient: 30 (06/08/19 1000)

## 2019-06-09 ENCOUNTER — Ambulatory Visit: Payer: Medicare Other | Admitting: Internal Medicine

## 2019-06-09 ENCOUNTER — Encounter: Payer: Self-pay | Admitting: Internal Medicine

## 2019-06-09 ENCOUNTER — Other Ambulatory Visit: Admission: RE | Admit: 2019-06-09 | Payer: Medicare Other | Source: Ambulatory Visit

## 2019-06-09 ENCOUNTER — Other Ambulatory Visit: Payer: Self-pay

## 2019-06-09 ENCOUNTER — Telehealth: Payer: Self-pay | Admitting: Internal Medicine

## 2019-06-09 VITALS — BP 120/60 | HR 87 | Temp 97.3°F | Ht 69.0 in | Wt 107.8 lb

## 2019-06-09 DIAGNOSIS — F17210 Nicotine dependence, cigarettes, uncomplicated: Secondary | ICD-10-CM

## 2019-06-09 DIAGNOSIS — R918 Other nonspecific abnormal finding of lung field: Secondary | ICD-10-CM | POA: Diagnosis not present

## 2019-06-09 DIAGNOSIS — J449 Chronic obstructive pulmonary disease, unspecified: Secondary | ICD-10-CM | POA: Diagnosis not present

## 2019-06-09 MED ORDER — ALBUTEROL SULFATE HFA 108 (90 BASE) MCG/ACT IN AERS: 1.0000 | INHALATION_SPRAY | Freq: Four times a day (QID) | RESPIRATORY_TRACT | 3 refills | Status: DC | PRN

## 2019-06-09 MED ORDER — OMEPRAZOLE 20 MG PO CPDR: 20.0000 mg | DELAYED_RELEASE_CAPSULE | Freq: Every day | ORAL | 11 refills | Status: DC

## 2019-06-09 MED ORDER — TRIAMCINOLONE ACETONIDE 55 MCG/ACT NA AERO: 1.0000 | INHALATION_SPRAY | Freq: Two times a day (BID) | NASAL | 2 refills | Status: DC

## 2019-06-09 MED ORDER — TRELEGY ELLIPTA 100-62.5-25 MCG/INH IN AEPB: 1.0000 | INHALATION_SPRAY | Freq: Every day | RESPIRATORY_TRACT | 0 refills | Status: AC

## 2019-06-09 MED ORDER — TRELEGY ELLIPTA 100-62.5-25 MCG/INH IN AEPB: 1.0000 | INHALATION_SPRAY | Freq: Every day | RESPIRATORY_TRACT | 10 refills | Status: DC

## 2019-06-09 NOTE — Patient Instructions (Addendum)
Will start Trelegy inhaler one puff once daily, rinse mouth after use.   Increase glucerna supplements to 1 bottle with each meal (three times daily).   Will plan from procedure (bronchoscopy) for biopsy next week after PET scan.   Start Nasacort, 1 spray in each nostril twice daily, and Omeprazole 20 mg once daily.

## 2019-06-09 NOTE — Telephone Encounter (Signed)
I will give to [facility] to check for me

## 2019-06-09 NOTE — Telephone Encounter (Signed)
Pt is scheduled for EBUS with Dr. Ashby Dawes on 06/13/2019 at 1:00.  RVA:[id number], [id number] PB:AQVO mass.  Rodena Piety, can you confirm with insurance that PA is not needed. Thanks

## 2019-06-09 NOTE — Telephone Encounter (Signed)
Pre admit and covid testing is scheduled for 06/10/2019 at 2:30. Okay per Colin Novak with PAT to have covid testing on 06/10/2019, as pt can not make it today by 2:30.  Pt's daughter, Colin Novak is aware and voiced her understanding.

## 2019-06-09 NOTE — Progress Notes (Signed)
[name]      Assessment and Plan:  Lung mass - Bilateral lung masses, suspicious for lung cancer.  Largest of which is right paratracheal. - We will plan for EBUS bronchoscopy, after patient's PET scan next week to help guide biopsies.  Emphysema.  -Severe emphysema with bullous changes, symptoms of chronic bronchitis with chronic cough and expectoration. - Discussed the importance of smoke cessation, started patient on trilogy inhaler once daily. He was given samples and demonstrated its use.   Cough, dyspnea on exertion, chronic bronchitis.  Chronic rhinitis, GERD. -I suspect that chronic rhinitis and GERD may be contributing to dyspnea.  In addition the patient has excess use of Afrin nasal spray. - He is prescribed Nasacort and omeprazole.  He is asked to wean down Afrin to half the dose.  Nicotine abuse.  - Discussed the importance of smoke cessation, spent 3 minutes in discussion. - Patient appears pre-contemplative at this time.  Malnourished/Cachexia.  - Significant weight loss, poor oral intake. - Asked patient to increase his nutritional supplements to 3/day, 1 with each meal.  Meds ordered this encounter  Medications  . Fluticasone-Umeclidin-Vilant (TRELEGY ELLIPTA) 100-62.5-25 MCG/INH AEPB    Sig: Inhale 1 applicator into the lungs daily. Rinse mouth after use.    Dispense:  1 each    Refill:  10  . triamcinolone (NASACORT) 55 MCG/ACT AERO nasal inhaler    Sig: Place 1 spray into the nose 2 (two) times daily.    Dispense:  1 Inhaler    Refill:  2  . omeprazole (PRILOSEC) 20 MG capsule    Sig: Take 1 capsule (20 mg total) by mouth daily.    Dispense:  30 capsule    Refill:  11  . Fluticasone-Umeclidin-Vilant (TRELEGY ELLIPTA) 100-62.5-25 MCG/INH AEPB    Sig: Inhale 1 puff into the lungs daily for 1 day.    Dispense:  2 each    Refill:  0    Order Specific Question:   Lot Number?    Answer:   [id number]    Order Specific  Question:   Expiration Date?    Answer:   06/27/2020    Order Specific Question:   Manufacturer?    Answer:   [employer] [12]  . albuterol (PROAIR HFA) 108 (90 Base) MCG/ACT inhaler    Sig: Inhale 1-2 puffs into the lungs every 6 (six) hours as needed for wheezing or shortness of breath (use as needed for chest congestion.).    Dispense:  1 g    Refill:  3   Return in about 2 weeks (around 06/23/2019), or post bronchoscopy followup..   Date: 06/09/2019  MRN# [id number] Colin Novak 03-Dec-1945    Colin Novak is a 73 y.o. old male seen in consultation for chief complaint of:    Chief Complaint  Patient presents with  . pulmonary consult    per Dr. Tasia Catchings- CT 06/01/2019. pt reports of sob with exertion and prod cough with clear mucus.     HPI:  Colin Novak is a 73 y.o. old male, he has been having a cough several years, he has COPD diagnosed several years ago. He does not take any inhalers, he feels that he has occasional wheezing which has been there fore several years.   He has lost about 20 lbs over the past 5 years. He is retired from [employer]. 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  He has sinus drainage, he has reflux and he take a tums for it. He take afrin once or twice per day.  He takes glucerna  Fluid shake once daily.  He has chest tightness.    He is smoking 1 ppd, he has quit in the past for 2 months, but does not think that he is ready to at this time.   **CT chest 06/01/2019>> imaging personally reviewed.  There are multiple nodules bilaterally, including a left lower lobe nodule, left upper lobe mass, and a large right paratracheal mass extending upwards with cut off of the right upper lobe bronchus.  There is diffuse emphysema, including bullous changes in the right base.  PMHX:   Past Medical History:  Diagnosis Date  . Anxiety   . COPD (chronic obstructive pulmonary disease) ([place])   .  Diabetes mellitus without complication ([place])    type 2  . Hyperlipidemia    Surgical Hx:  Past Surgical History:  Procedure Laterality Date  . APPENDECTOMY    . EYE SURGERY Bilateral    cataract   Family Hx:  Family History  Problem Relation Age of Onset  . Cancer Mother        lung (non smoker)  . Diabetes Father   . Hypertension Father   . Lymphoma Brother   . Prostate cancer Brother    Social Hx:   Social History   Tobacco Use  . Smoking status: Current Every Day Smoker    Packs/day: 2.00    Types: Cigarettes  . Smokeless tobacco: Never Used  . Tobacco comment: 1 pack daily- 06/09/2019  Substance Use Topics  . Alcohol use: No  . Drug use: No   Medication:    Current Outpatient Medications:  .  aspirin EC 81 MG tablet, Take 81 mg by mouth daily., Disp: , Rfl:  .  citalopram (CELEXA) 20 MG tablet, Take 1 tablet (20 mg total) by mouth daily., Disp: 90 tablet, Rfl: 4 .  Dextromethorphan-guaiFENesin (ROBITUSSIN DM PO), Take 5 mLs by mouth at bedtime. , Disp: , Rfl:  .  ezetimibe-simvastatin (VYTORIN) 10-40 MG tablet, Take 1 tablet by mouth daily., Disp: 90 tablet, Rfl: 4 .  glucose blood (ONE TOUCH ULTRA TEST) test strip, 1 each by Other route 2 (two) times daily as needed for other. Dx: E11.9, Disp: 100 each, Rfl: 12 .  LANTUS SOLOSTAR 100 UNIT/ML Solostar Pen, INJECT 10-20 UNITS INTO THE SKIN DAILY (Patient taking differently: Inject 12 Units into the skin daily. ), Disp: 5 pen, Rfl: 12 .  metFORMIN (GLUCOPHAGE) 500 MG tablet, TAKE ONE TABLET BY MOUTH TWICE DAILY (Patient taking differently: Take 1,000 mg by mouth 2 (two) times daily. ), Disp: 60 tablet, Rfl: 12 .  Multiple Vitamin (MULTIVITAMIN) tablet, Take 1 tablet by mouth daily., Disp: , Rfl:  .  NOVOFINE 32G X 6 MM MISC, USE AS DIRECTED. 5 INJECTIONS A DAY, Disp: 900 each, Rfl: 0 .  oxymetazoline (AFRIN) 0.05 % nasal spray, Place 2 sprays into both nostrils 2 (two) times daily as needed. , Disp: , Rfl:  .   pioglitazone (ACTOS) 30 MG tablet, Take 30 mg by mouth daily. , Disp: , Rfl: 1 .  pseudoephedrine-acetaminophen (TYLENOL SINUS) 30-500 MG TABS tablet, Take 2 tablets by mouth every 4 (four) hours as needed. Only using twice daily, Disp: , Rfl:    Allergies:  Penicillins  Review of Systems: Gen:  Denies  fever, sweats, chills HEENT: Denies blurred vision, double  vision. bleeds, sore throat Cvc:  No dizziness, chest pain. Resp:   Denies cough or sputum production, shortness of breath Gi: Denies swallowing difficulty, stomach pain. Gu:  Denies bladder incontinence, burning urine Ext:   No Joint pain, stiffness. Skin: No skin rash,  hives  Endoc:  No polyuria, polydipsia. Psych: No depression, insomnia. Other:  All other systems were reviewed with the patient and were negative other that what is mentioned in the HPI.   Physical Examination:   VS: BP 120/60 (BP Location: Left Arm, Cuff Size: Normal)   Pulse 87   Temp (!) 97.3 F (36.3 C) (Temporal)   Ht 5\' 9"  (1.753 m)   Wt 107 lb 12.8 oz (48.9 kg)   SpO2 96%   BMI 15.92 kg/m   General Appearance: No distress appears cachectic. Neuro:without focal findings,  speech normal,  HEENT: PERRLA, EOM intact.   Pulmonary: normal breath sounds, No wheezing.  Decreased air entry bilaterally. CardiovascularNormal S1,S2.  No m/r/g.   Abdomen: Benign, Soft, non-tender. Renal:  No costovertebral tenderness  GU:  No performed at this time. Endoc: No evident thyromegaly, no signs of acromegaly. Skin:   warm, no rashes, no ecchymosis  Extremities: normal, no cyanosis, clubbing.  Other findings:    LABORATORY PANEL:   CBC Recent Labs  Lab 06/07/19 1157  WBC 6.1  HGB 14.5  HCT 41.3  PLT 219   ------------------------------------------------------------------------------------------------------------------  Chemistries  Recent Labs  Lab 06/07/19 1157  NA 132*  K 4.6  CL 95*  CO2 27  GLUCOSE 416*  BUN 15  CREATININE 0.73   CALCIUM 9.3  AST 26  ALT 37  ALKPHOS 77  BILITOT 0.7   ------------------------------------------------------------------------------------------------------------------  Cardiac Enzymes No results for input(s): TROPONINI in the last 168 hours. ------------------------------------------------------------  RADIOLOGY:  No results found.     Thank  you for the consultation and for allowing [facility] to assist in the care of your patient. Our recommendations are noted above.  Please contact us if we can be of further service.   Marda Stalker, M.D., F.C.C.P.  Board Certified in Internal Medicine, Pulmonary Medicine, [place], and Sleep Medicine.  [facility] Office Number: [phone]   06/09/2019

## 2019-06-09 NOTE — H&P (View-Only) (Signed)
[facility] Consultation      Assessment and Plan:  Lung mass - Bilateral lung masses, suspicious for lung cancer.  Largest of which is right paratracheal. - We will plan for EBUS bronchoscopy, after patient's PET scan next week to help guide biopsies.  Emphysema.  -Severe emphysema with bullous changes, symptoms of chronic bronchitis with chronic cough and expectoration. - Discussed the importance of smoke cessation, started patient on trilogy inhaler once daily. He was given samples and demonstrated its use.   Cough, dyspnea on exertion, chronic bronchitis.  Chronic rhinitis, GERD. -I suspect that chronic rhinitis and GERD may be contributing to dyspnea.  In addition the patient has excess use of Afrin nasal spray. - He is prescribed Nasacort and omeprazole.  He is asked to wean down Afrin to half the dose.  Nicotine abuse.  - Discussed the importance of smoke cessation, spent 3 minutes in discussion. - Patient appears pre-contemplative at this time.  Malnourished/Cachexia.  - Significant weight loss, poor oral intake. - Asked patient to increase his nutritional supplements to 3/day, 1 with each meal.  Meds ordered this encounter  Medications  . Fluticasone-Umeclidin-Vilant (TRELEGY ELLIPTA) 100-62.5-25 MCG/INH AEPB    Sig: Inhale 1 applicator into the lungs daily. Rinse mouth after use.    Dispense:  1 each    Refill:  10  . triamcinolone (NASACORT) 55 MCG/ACT AERO nasal inhaler    Sig: Place 1 spray into the nose 2 (two) times daily.    Dispense:  1 Inhaler    Refill:  2  . omeprazole (PRILOSEC) 20 MG capsule    Sig: Take 1 capsule (20 mg total) by mouth daily.    Dispense:  30 capsule    Refill:  11  . Fluticasone-Umeclidin-Vilant (TRELEGY ELLIPTA) 100-62.5-25 MCG/INH AEPB    Sig: Inhale 1 puff into the lungs daily for 1 day.    Dispense:  2 each    Refill:  0    Order Specific Question:   Lot Number?    Answer:   [id number]    Order Specific  Question:   Expiration Date?    Answer:   06/27/2020    Order Specific Question:   Manufacturer?    Answer:   Gl[employer]SmithK[employer] [12]  . albuterol (PROAIR HFA) 108 (90 Base) MCG/ACT inhaler    Sig: Inhale 1-2 puffs into the lungs every 6 (six) hours as needed for wheezing or shortness of breath (use as needed for chest congestion.).    Dispense:  1 g    Refill:  3   Return in about 2 weeks (around 06/23/2019), or post bronchoscopy followup..   Date: 06/09/2019  MRN# [id number] Colin Novak 09-25-1946    Colin Novak is a 73 y.o. old male seen in consultation for chief complaint of:    Chief Complaint  Patient presents with  . pulmonary consult    per Dr. Tasia Catchings- CT 06/01/2019. pt reports of sob with exertion and prod cough with clear mucus.     HPI:  Colin Novak is a 73 y.o. old male, he has been having a cough several years, he has COPD diagnosed several years ago. He does not take any inhalers, he feels that he has occasional wheezing which has been there fore several years.   He has lost about 20 lbs over the past 5 years. He is retired from [employer]. 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  He has sinus drainage, he has reflux and he take a tums for it. He take afrin once or twice per day.  He takes glucerna  Fluid shake once daily.  He has chest tightness.    He is smoking 1 ppd, he has quit in the past for 2 months, but does not think that he is ready to at this time.   **CT chest 06/01/2019>> imaging personally reviewed.  There are multiple nodules bilaterally, including a left lower lobe nodule, left upper lobe mass, and a large right paratracheal mass extending upwards with cut off of the right upper lobe bronchus.  There is diffuse emphysema, including bullous changes in the right base.  PMHX:   Past Medical History:  Diagnosis Date  . Anxiety   . COPD (chronic obstructive pulmonary disease) ([place])   .  Diabetes mellitus without complication ([place])    type 2  . Hyperlipidemia    Surgical Hx:  Past Surgical History:  Procedure Laterality Date  . APPENDECTOMY    . EYE SURGERY Bilateral    cataract   Family Hx:  Family History  Problem Relation Age of Onset  . Cancer Mother        lung (non smoker)  . Diabetes Father   . Hypertension Father   . Lymphoma Brother   . Prostate cancer Brother    Social Hx:   Social History   Tobacco Use  . Smoking status: Current Every Day Smoker    Packs/day: 2.00    Types: Cigarettes  . Smokeless tobacco: Never Used  . Tobacco comment: 1 pack daily- 06/09/2019  Substance Use Topics  . Alcohol use: No  . Drug use: No   Medication:    Current Outpatient Medications:  .  aspirin EC 81 MG tablet, Take 81 mg by mouth daily., Disp: , Rfl:  .  citalopram (CELEXA) 20 MG tablet, Take 1 tablet (20 mg total) by mouth daily., Disp: 90 tablet, Rfl: 4 .  Dextromethorphan-guaiFENesin (ROBITUSSIN DM PO), Take 5 mLs by mouth at bedtime. , Disp: , Rfl:  .  ezetimibe-simvastatin (VYTORIN) 10-40 MG tablet, Take 1 tablet by mouth daily., Disp: 90 tablet, Rfl: 4 .  glucose blood (ONE TOUCH ULTRA TEST) test strip, 1 each by Other route 2 (two) times daily as needed for other. Dx: E11.9, Disp: 100 each, Rfl: 12 .  LANTUS SOLOSTAR 100 UNIT/ML Solostar Pen, INJECT 10-20 UNITS INTO THE SKIN DAILY (Patient taking differently: Inject 12 Units into the skin daily. ), Disp: 5 pen, Rfl: 12 .  metFORMIN (GLUCOPHAGE) 500 MG tablet, TAKE ONE TABLET BY MOUTH TWICE DAILY (Patient taking differently: Take 1,000 mg by mouth 2 (two) times daily. ), Disp: 60 tablet, Rfl: 12 .  Multiple Vitamin (MULTIVITAMIN) tablet, Take 1 tablet by mouth daily., Disp: , Rfl:  .  NOVOFINE 32G X 6 MM MISC, USE AS DIRECTED. 5 INJECTIONS A DAY, Disp: 900 each, Rfl: 0 .  oxymetazoline (AFRIN) 0.05 % nasal spray, Place 2 sprays into both nostrils 2 (two) times daily as needed. , Disp: , Rfl:  .   pioglitazone (ACTOS) 30 MG tablet, Take 30 mg by mouth daily. , Disp: , Rfl: 1 .  pseudoephedrine-acetaminophen (TYLENOL SINUS) 30-500 MG TABS tablet, Take 2 tablets by mouth every 4 (four) hours as needed. Only using twice daily, Disp: , Rfl:    Allergies:  Penicillins  Review of Systems: Gen:  Denies  fever, sweats, chills HEENT: Denies blurred vision, double  vision. bleeds, sore throat Cvc:  No dizziness, chest pain. Resp:   Denies cough or sputum production, shortness of breath Gi: Denies swallowing difficulty, stomach pain. Gu:  Denies bladder incontinence, burning urine Ext:   No Joint pain, stiffness. Skin: No skin rash,  hives  Endoc:  No polyuria, polydipsia. Psych: No depression, insomnia. Other:  All other systems were reviewed with the patient and were negative other that what is mentioned in the HPI.   Physical Examination:   VS: BP 120/60 (BP Location: Left Arm, Cuff Size: Normal)   Pulse 87   Temp (!) 97.3 F (36.3 C) (Temporal)   Ht 5\' 9"  (1.753 m)   Wt 107 lb 12.8 oz (48.9 kg)   SpO2 96%   BMI 15.92 kg/m   General Appearance: No distress appears cachectic. Neuro:without focal findings,  speech normal,  HEENT: PERRLA, EOM intact.   Pulmonary: normal breath sounds, No wheezing.  Decreased air entry bilaterally. CardiovascularNormal S1,S2.  No m/r/g.   Abdomen: Benign, Soft, non-tender. Renal:  No costovertebral tenderness  GU:  No performed at this time. Endoc: No evident thyromegaly, no signs of acromegaly. Skin:   warm, no rashes, no ecchymosis  Extremities: normal, no cyanosis, clubbing.  Other findings:    LABORATORY PANEL:   CBC Recent Labs  Lab 06/07/19 1157  WBC 6.1  HGB 14.5  HCT 41.3  PLT 219   ------------------------------------------------------------------------------------------------------------------  Chemistries  Recent Labs  Lab 06/07/19 1157  NA 132*  K 4.6  CL 95*  CO2 27  GLUCOSE 416*  BUN 15  CREATININE 0.73   CALCIUM 9.3  AST 26  ALT 37  ALKPHOS 77  BILITOT 0.7   ------------------------------------------------------------------------------------------------------------------  Cardiac Enzymes No results for input(s): TROPONINI in the last 168 hours. ------------------------------------------------------------  RADIOLOGY:  No results found.     Thank  you for the consultation and for allowing [facility] to assist in the care of your patient. Our recommendations are noted above.  Please contact us if we can be of further service.   Marda Stalker, M.D., F.C.C.P.  Board Certified in Internal Medicine, Pulmonary Medicine, [place], and Sleep Medicine.  [facility] Office Number: [phone]   06/09/2019

## 2019-06-10 ENCOUNTER — Other Ambulatory Visit: Payer: Self-pay

## 2019-06-10 ENCOUNTER — Encounter
Admission: RE | Admit: 2019-06-10 | Discharge: 2019-06-10 | Disposition: A | Discharge status: Home / Self Care | Payer: Medicare Other | Source: Ambulatory Visit | Attending: Internal Medicine | Admitting: Internal Medicine

## 2019-06-10 DIAGNOSIS — Z79899 Other long term (current) drug therapy: Secondary | ICD-10-CM | POA: Diagnosis not present

## 2019-06-10 DIAGNOSIS — Z7982 Long term (current) use of aspirin: Secondary | ICD-10-CM | POA: Diagnosis not present

## 2019-06-10 DIAGNOSIS — K219 Gastro-esophageal reflux disease without esophagitis: Secondary | ICD-10-CM | POA: Diagnosis not present

## 2019-06-10 DIAGNOSIS — Z20828 Contact with and (suspected) exposure to other viral communicable diseases: Secondary | ICD-10-CM | POA: Insufficient documentation

## 2019-06-10 DIAGNOSIS — Z01812 Encounter for preprocedural laboratory examination: Secondary | ICD-10-CM | POA: Insufficient documentation

## 2019-06-10 DIAGNOSIS — E46 Unspecified protein-calorie malnutrition: Secondary | ICD-10-CM | POA: Diagnosis not present

## 2019-06-10 DIAGNOSIS — F419 Anxiety disorder, unspecified: Secondary | ICD-10-CM | POA: Diagnosis not present

## 2019-06-10 DIAGNOSIS — Z794 Long term (current) use of insulin: Secondary | ICD-10-CM | POA: Diagnosis not present

## 2019-06-10 DIAGNOSIS — C3411 Malignant neoplasm of upper lobe, right bronchus or lung: Secondary | ICD-10-CM | POA: Diagnosis not present

## 2019-06-10 DIAGNOSIS — F17210 Nicotine dependence, cigarettes, uncomplicated: Secondary | ICD-10-CM | POA: Diagnosis not present

## 2019-06-10 DIAGNOSIS — Z7951 Long term (current) use of inhaled steroids: Secondary | ICD-10-CM | POA: Diagnosis not present

## 2019-06-10 DIAGNOSIS — F329 Major depressive disorder, single episode, unspecified: Secondary | ICD-10-CM | POA: Diagnosis not present

## 2019-06-10 DIAGNOSIS — E785 Hyperlipidemia, unspecified: Secondary | ICD-10-CM | POA: Diagnosis not present

## 2019-06-10 DIAGNOSIS — J439 Emphysema, unspecified: Secondary | ICD-10-CM | POA: Diagnosis not present

## 2019-06-10 DIAGNOSIS — Z880 Allergy status to penicillin: Secondary | ICD-10-CM | POA: Diagnosis not present

## 2019-06-10 DIAGNOSIS — Z681 Body mass index (BMI) 19 or less, adult: Secondary | ICD-10-CM | POA: Diagnosis not present

## 2019-06-10 DIAGNOSIS — R918 Other nonspecific abnormal finding of lung field: Secondary | ICD-10-CM | POA: Diagnosis present

## 2019-06-10 DIAGNOSIS — Z801 Family history of malignant neoplasm of trachea, bronchus and lung: Secondary | ICD-10-CM | POA: Diagnosis not present

## 2019-06-10 DIAGNOSIS — C771 Secondary and unspecified malignant neoplasm of intrathoracic lymph nodes: Secondary | ICD-10-CM | POA: Diagnosis not present

## 2019-06-10 DIAGNOSIS — E1151 Type 2 diabetes mellitus with diabetic peripheral angiopathy without gangrene: Secondary | ICD-10-CM | POA: Diagnosis not present

## 2019-06-10 DIAGNOSIS — C3412 Malignant neoplasm of upper lobe, left bronchus or lung: Secondary | ICD-10-CM | POA: Diagnosis not present

## 2019-06-10 DIAGNOSIS — J310 Chronic rhinitis: Secondary | ICD-10-CM | POA: Diagnosis not present

## 2019-06-10 HISTORY — DX: Gastro-esophageal reflux disease without esophagitis: K21.9

## 2019-06-10 NOTE — Telephone Encounter (Signed)
Per [facility] Prior Josem Kaufmann is not required for this

## 2019-06-10 NOTE — Patient Instructions (Signed)
Your procedure is scheduled on: Monday 8/17 Report to Day Surgery.  [facility] at 12:00  Remember: Instructions that are not followed completely may result in serious medical risk,  up to and including death, or upon the discretion of your surgeon and anesthesiologist your  surgery may need to be rescheduled.     _X__ 1. Do not eat food after midnight the night before your procedure.                 No gum chewing or hard candies. You may drink clear liquids up to 2 hours                 before you are scheduled to arrive for your surgery- DO not drink clear                 liquids within 2 hours of the start of your surgery.                 Clear Liquids include:  water, Black Coffee or Tea (Do not add                 anything to coffee or tea).  __X__2.  On the morning of surgery brush your teeth with toothpaste and water, you                may rinse your mouth with mouthwash if you wish.  Do not swallow any toothpaste of mouthwash.     ___ 3.  No Alcohol for 24 hours before or after surgery.   _X__ 4.  Do Not Smoke or use e-cigarettes For 24 Hours Prior to Your Surgery.                 Do not use any chewable tobacco products for at least 6 hours prior to                 surgery.  ____  5.  Bring all medications with you on the day of surgery if instructed.   __x__  6.  Notify your doctor if there is any change in your medical condition      (cold, fever, infections).     Do not wear jewelry, make-up, hairpins, clips or nail polish. Do not wear lotions, powders, or perfumes. You may wear deodorant. Do not shave 48 hours prior to surgery. Men may shave face and neck. Do not bring valuables to the hospital.    [facility] is not responsible for any belongings or valuables.  Contacts, dentures or bridgework may not be worn into surgery. Leave your suitcase in the car. After surgery it may be brought to your room. For patients admitted to the hospital,  discharge time is determined by your treatment team.   Patients discharged the day of surgery will not be allowed to drive home.   Please read over the following fact sheets that you were given:    __x__ Take these medicines the morning of surgery with A SIP OF WATER:    1. citalopram (CELEXA) 20 MG tablet  2. ezetimibe-simvastatin (VYTORIN) 10-40 MG tablet  3. omeprazole (PRILOSEC) 20 MG capsule  Take dose the night before and morning of surgery  4.  5.  6.  ____ Fleet Enema (as directed)   ____ Use CHG Soap as directed  _x___ Use inhalers on the day of surgery Fluticasone-Umeclidin-Vilant (TRELEGY ELLIPTA) 100-62.5-25 MCG/INH AEPB ( sample)  __x__ Stop metformin 2 days prior to surgery  last dose tonight    ____ Take 1/2 of usual insulin dose the night before surgery. No insulin the morning          of surgery.   __x__ Stop aspirin today  __x__ Stop Anti-inflammatories  No aleve or ibuprofen.  May take tylenol   ____ Stop supplements until after surgery.    ____ Bring C-Pap to the hospital.

## 2019-06-11 LAB — SARS CORONAVIRUS 2 (TAT 6-24 HRS): SARS Coronavirus 2: NEGATIVE

## 2019-06-13 ENCOUNTER — Encounter: Payer: Self-pay | Admitting: *Deleted

## 2019-06-13 ENCOUNTER — Inpatient Hospital Stay: Payer: Medicare Other

## 2019-06-13 ENCOUNTER — Encounter
Admission: RE | Disposition: A | Discharge status: Home / Self Care | Payer: Self-pay | Source: Home / Self Care | Attending: Internal Medicine

## 2019-06-13 ENCOUNTER — Ambulatory Visit: Payer: Medicare Other | Admitting: Anesthesiology

## 2019-06-13 ENCOUNTER — Ambulatory Visit: Payer: Medicare Other

## 2019-06-13 ENCOUNTER — Other Ambulatory Visit: Payer: Self-pay

## 2019-06-13 ENCOUNTER — Ambulatory Visit
Admission: RE | Admit: 2019-06-13 | Discharge: 2019-06-13 | Disposition: A | Discharge status: Home / Self Care | Payer: Medicare Other | Attending: Internal Medicine | Admitting: Internal Medicine

## 2019-06-13 DIAGNOSIS — R918 Other nonspecific abnormal finding of lung field: Secondary | ICD-10-CM | POA: Diagnosis not present

## 2019-06-13 DIAGNOSIS — Z79899 Other long term (current) drug therapy: Secondary | ICD-10-CM | POA: Insufficient documentation

## 2019-06-13 DIAGNOSIS — Z7951 Long term (current) use of inhaled steroids: Secondary | ICD-10-CM | POA: Diagnosis not present

## 2019-06-13 DIAGNOSIS — C3412 Malignant neoplasm of upper lobe, left bronchus or lung: Secondary | ICD-10-CM | POA: Insufficient documentation

## 2019-06-13 DIAGNOSIS — E46 Unspecified protein-calorie malnutrition: Secondary | ICD-10-CM | POA: Diagnosis not present

## 2019-06-13 DIAGNOSIS — J310 Chronic rhinitis: Secondary | ICD-10-CM | POA: Insufficient documentation

## 2019-06-13 DIAGNOSIS — Z880 Allergy status to penicillin: Secondary | ICD-10-CM | POA: Insufficient documentation

## 2019-06-13 DIAGNOSIS — F329 Major depressive disorder, single episode, unspecified: Secondary | ICD-10-CM | POA: Diagnosis not present

## 2019-06-13 DIAGNOSIS — C3411 Malignant neoplasm of upper lobe, right bronchus or lung: Secondary | ICD-10-CM | POA: Insufficient documentation

## 2019-06-13 DIAGNOSIS — Z20828 Contact with and (suspected) exposure to other viral communicable diseases: Secondary | ICD-10-CM | POA: Insufficient documentation

## 2019-06-13 DIAGNOSIS — K219 Gastro-esophageal reflux disease without esophagitis: Secondary | ICD-10-CM | POA: Diagnosis not present

## 2019-06-13 DIAGNOSIS — J449 Chronic obstructive pulmonary disease, unspecified: Secondary | ICD-10-CM | POA: Diagnosis not present

## 2019-06-13 DIAGNOSIS — C771 Secondary and unspecified malignant neoplasm of intrathoracic lymph nodes: Secondary | ICD-10-CM | POA: Diagnosis not present

## 2019-06-13 DIAGNOSIS — Z681 Body mass index (BMI) 19 or less, adult: Secondary | ICD-10-CM | POA: Diagnosis not present

## 2019-06-13 DIAGNOSIS — Z7982 Long term (current) use of aspirin: Secondary | ICD-10-CM | POA: Insufficient documentation

## 2019-06-13 DIAGNOSIS — F17210 Nicotine dependence, cigarettes, uncomplicated: Secondary | ICD-10-CM | POA: Diagnosis not present

## 2019-06-13 DIAGNOSIS — Z794 Long term (current) use of insulin: Secondary | ICD-10-CM | POA: Diagnosis not present

## 2019-06-13 DIAGNOSIS — E109 Type 1 diabetes mellitus without complications: Secondary | ICD-10-CM | POA: Diagnosis not present

## 2019-06-13 DIAGNOSIS — J439 Emphysema, unspecified: Secondary | ICD-10-CM | POA: Diagnosis not present

## 2019-06-13 DIAGNOSIS — Z801 Family history of malignant neoplasm of trachea, bronchus and lung: Secondary | ICD-10-CM | POA: Insufficient documentation

## 2019-06-13 DIAGNOSIS — Z9889 Other specified postprocedural states: Secondary | ICD-10-CM

## 2019-06-13 DIAGNOSIS — F419 Anxiety disorder, unspecified: Secondary | ICD-10-CM | POA: Insufficient documentation

## 2019-06-13 DIAGNOSIS — E1151 Type 2 diabetes mellitus with diabetic peripheral angiopathy without gangrene: Secondary | ICD-10-CM | POA: Insufficient documentation

## 2019-06-13 DIAGNOSIS — E785 Hyperlipidemia, unspecified: Secondary | ICD-10-CM | POA: Insufficient documentation

## 2019-06-13 HISTORY — PX: ENDOBRONCHIAL ULTRASOUND: SHX5096

## 2019-06-13 LAB — GLUCOSE, CAPILLARY
Glucose-Capillary: 340 mg/dL — ABNORMAL HIGH (ref 70–99)
Glucose-Capillary: 275 mg/dL — ABNORMAL HIGH (ref 70–99)
Glucose-Capillary: 286 mg/dL — ABNORMAL HIGH (ref 70–99)
Glucose-Capillary: 199 mg/dL — ABNORMAL HIGH (ref 70–99)

## 2019-06-13 SURGERY — ENDOBRONCHIAL ULTRASOUND (EBUS)
Anesthesia: General | Laterality: Bilateral

## 2019-06-13 MED ORDER — SUGAMMADEX SODIUM 200 MG/2ML IV SOLN
INTRAVENOUS | Status: AC
  Filled 2019-06-13: qty 2

## 2019-06-13 MED ORDER — ONDANSETRON HCL 4 MG/2ML IJ SOLN
INTRAMUSCULAR | Status: DC | PRN
  Administered 2019-06-13: 4 mg via INTRAVENOUS

## 2019-06-13 MED ORDER — PROPOFOL 10 MG/ML IV BOLUS
INTRAVENOUS | Status: DC | PRN
  Administered 2019-06-13: 120 mg via INTRAVENOUS

## 2019-06-13 MED ORDER — SODIUM CHLORIDE 0.9 % IV SOLN
INTRAVENOUS | Status: DC
  Administered 2019-06-13: 13:00:00 via INTRAVENOUS

## 2019-06-13 MED ORDER — LIDOCAINE HCL (CARDIAC) PF 100 MG/5ML IV SOSY
PREFILLED_SYRINGE | INTRAVENOUS | Status: DC | PRN
  Administered 2019-06-13: 100 mg via INTRAVENOUS

## 2019-06-13 MED ORDER — BUTAMBEN-TETRACAINE-BENZOCAINE 2-2-14 % EX AERO
1.0000 | INHALATION_SPRAY | Freq: Once | CUTANEOUS | Status: DC
  Filled 2019-06-13: qty 20

## 2019-06-13 MED ORDER — ACETAMINOPHEN 325 MG PO TABS: 325.0000 mg | ORAL_TABLET | ORAL | Status: DC | PRN

## 2019-06-13 MED ORDER — ONDANSETRON HCL 4 MG/2ML IJ SOLN
INTRAMUSCULAR | Status: AC
  Filled 2019-06-13: qty 2

## 2019-06-13 MED ORDER — ROCURONIUM BROMIDE 100 MG/10ML IV SOLN
INTRAVENOUS | Status: DC | PRN
  Administered 2019-06-13: 15 mg via INTRAVENOUS
  Administered 2019-06-13: 5 mg via INTRAVENOUS

## 2019-06-13 MED ORDER — FENTANYL CITRATE (PF) 100 MCG/2ML IJ SOLN
INTRAMUSCULAR | Status: AC
  Filled 2019-06-13: qty 2

## 2019-06-13 MED ORDER — SEVOFLURANE IN SOLN
RESPIRATORY_TRACT | Status: AC
  Filled 2019-06-13: qty 250

## 2019-06-13 MED ORDER — SUCCINYLCHOLINE CHLORIDE 20 MG/ML IJ SOLN
INTRAMUSCULAR | Status: DC | PRN
  Administered 2019-06-13: 100 mg via INTRAVENOUS

## 2019-06-13 MED ORDER — PHENYLEPHRINE HCL (PRESSORS) 10 MG/ML IV SOLN
INTRAVENOUS | Status: DC | PRN
  Administered 2019-06-13 (×2): 50 ug via INTRAVENOUS

## 2019-06-13 MED ORDER — ACETAMINOPHEN 160 MG/5ML PO SOLN
325.0000 mg | ORAL | Status: DC | PRN
  Filled 2019-06-13: qty 20.3

## 2019-06-13 MED ORDER — FENTANYL CITRATE (PF) 100 MCG/2ML IJ SOLN
INTRAMUSCULAR | Status: DC | PRN
  Administered 2019-06-13: 50 ug via INTRAVENOUS

## 2019-06-13 MED ORDER — PROMETHAZINE HCL 25 MG/ML IJ SOLN: 6.2500 mg | INTRAMUSCULAR | Status: DC | PRN

## 2019-06-13 MED ORDER — SUCCINYLCHOLINE CHLORIDE 20 MG/ML IJ SOLN
INTRAMUSCULAR | Status: AC
  Filled 2019-06-13: qty 1

## 2019-06-13 MED ORDER — FAMOTIDINE 20 MG PO TABS
ORAL_TABLET | ORAL | Status: AC
  Administered 2019-06-13: 20 mg
  Filled 2019-06-13: qty 1

## 2019-06-13 MED ORDER — PHENYLEPHRINE HCL 0.25 % NA SOLN
1.0000 | Freq: Four times a day (QID) | NASAL | Status: DC | PRN
  Filled 2019-06-13: qty 15

## 2019-06-13 MED ORDER — MEPERIDINE HCL 50 MG/ML IJ SOLN: 6.2500 mg | INTRAMUSCULAR | Status: DC | PRN

## 2019-06-13 MED ORDER — INSULIN ASPART 100 UNIT/ML ~~LOC~~ SOLN
SUBCUTANEOUS | Status: AC
  Administered 2019-06-13: 10 [IU]
  Filled 2019-06-13: qty 1

## 2019-06-13 MED ORDER — SUGAMMADEX SODIUM 200 MG/2ML IV SOLN
INTRAVENOUS | Status: DC | PRN
  Administered 2019-06-13: 100 mg via INTRAVENOUS

## 2019-06-13 MED ORDER — LIDOCAINE HCL 2 % EX GEL
1.0000 "application " | Freq: Once | CUTANEOUS | Status: DC
  Filled 2019-06-13: qty 4250

## 2019-06-13 MED ORDER — INSULIN ASPART 100 UNIT/ML ~~LOC~~ SOLN: 10.0000 [IU] | Freq: Once | SUBCUTANEOUS | Status: DC

## 2019-06-13 MED ORDER — ROCURONIUM BROMIDE 50 MG/5ML IV SOLN
INTRAVENOUS | Status: AC
  Filled 2019-06-13: qty 1

## 2019-06-13 MED ORDER — INSULIN REGULAR HUMAN 100 UNIT/ML IJ SOLN
2.0000 [IU] | Freq: Once | INTRAMUSCULAR | Status: AC
  Administered 2019-06-13: 2 [IU] via SUBCUTANEOUS
  Filled 2019-06-13: qty 10

## 2019-06-13 MED ORDER — INSULIN ASPART 100 UNIT/ML ~~LOC~~ SOLN
SUBCUTANEOUS | Status: AC
  Filled 2019-06-13: qty 1

## 2019-06-13 MED ORDER — PROPOFOL 10 MG/ML IV BOLUS
INTRAVENOUS | Status: AC
  Filled 2019-06-13: qty 20

## 2019-06-13 NOTE — Discharge Instructions (Signed)
Flexible Bronchoscopy, Care After This sheet gives you information about how to care for yourself after your test. Your doctor may also give you more specific instructions. If you have problems or questions, contact your doctor. Follow these instructions at home: Eating and drinking  Do not eat or drink anything (not even water) for 2 hours after your test, or until your numbing medicine (local anesthetic) wears off.  When your numbness is gone and your cough and gag reflexes have come back, you may: ? Eat only soft foods. ? Slowly drink liquids.  The day after the test, go back to your normal diet. Driving  Do not drive for 24 hours if you were given a medicine to help you relax (sedative).  Do not drive or use heavy machinery while taking prescription pain medicine. General instructions   Take over-the-counter and prescription medicines only as told by your doctor.  Return to your normal activities as told. Ask what activities are safe for you.  Do not use any products that have nicotine or tobacco in them. This includes cigarettes and e-cigarettes. If you need help quitting, ask your doctor.  Keep all follow-up visits as told by your doctor. This is important. It is very important if you had a tissue sample (biopsy) taken. Get help right away if:  You have shortness of breath that gets worse.  You get light-headed.  You feel like you are going to pass out (faint).  You have chest pain.  You cough up: ? More than a little blood. ? More blood than before. Summary  Do not eat or drink anything (not even water) for 2 hours after your test, or until your numbing medicine wears off.  Do not use cigarettes. Do not use e-cigarettes.  Get help right away if you have chest pain. This information is not intended to replace advice given to you by your health care provider. Make sure you discuss any questions you have with your health care provider. Document Released: 08/10/2009  Document Revised: 09/25/2017 Document Reviewed: 10/31/2016 Elsevier Patient Education  2020 [employer].

## 2019-06-13 NOTE — Anesthesia Post-op Follow-up Note (Signed)
Anesthesia QCDR form completed.        

## 2019-06-13 NOTE — Interval H&P Note (Signed)
History and Physical Interval Note:  06/13/2019 12:10 PM  Colin Novak  has presented today for surgery, with the diagnosis of LUNG MASS.  The various methods of treatment have been discussed with the patient and family. After consideration of risks, benefits and other options for treatment, the patient has consented to  Procedure(s): ENDOBRONCHIAL ULTRASOUND, BILATERAL (Bilateral) as a surgical intervention.  The patient's history has been reviewed, patient examined, no change in status, stable for surgery.  I have reviewed the patient's chart and labs.  Questions were answered to the patient's satisfaction.     [name]

## 2019-06-13 NOTE — Anesthesia Procedure Notes (Signed)
Procedure Name: Intubation Date/Time: 06/13/2019 1:34 PM Performed by: Dionne Bucy, CRNA Pre-anesthesia Checklist: Patient identified, Patient being monitored, Timeout performed, Emergency Drugs available and Suction available Patient Re-evaluated:Patient Re-evaluated prior to induction Oxygen Delivery Method: Circle system utilized Preoxygenation: Pre-oxygenation with 100% oxygen Induction Type: IV induction Ventilation: Mask ventilation without difficulty Laryngoscope Size: McGraph and 4 Grade View: Grade I Tube type: Oral Tube size: 8.0 mm Number of attempts: 1 Airway Equipment and Method: Stylet and Video-laryngoscopy Placement Confirmation: ETT inserted through vocal cords under direct vision,  positive ETCO2 and breath sounds checked- equal and bilateral Secured at: 23 cm Tube secured with: Tape Dental Injury: Teeth and Oropharynx as per pre-operative assessment

## 2019-06-13 NOTE — Transfer of Care (Addendum)
Immediate Anesthesia Transfer of Care Note  Patient: Colin Novak  Procedure(s) Performed: ENDOBRONCHIAL ULTRASOUND, BILATERAL (Bilateral )  Patient Location: PACU  Anesthesia Type:General  Level of Consciousness: sedated  Airway & Oxygen Therapy: Patient Spontanous Breathing and Patient connected to face mask oxygen  Post-op Assessment: Report given to RN and Post -op Vital signs reviewed and stable  Post vital signs: Reviewed and stable  Last Vitals:  Vitals Value Taken Time  BP 128/64   Temp    Pulse 91 06/13/19 1446  Resp 18 06/13/19 1446  SpO2 99 % 06/13/19 1446  Vitals shown include unvalidated device data.  Last Pain:  Vitals:   06/13/19 1213  TempSrc: Tympanic  PainSc: 0-No pain         Complications: No apparent anesthesia complications

## 2019-06-13 NOTE — Anesthesia Preprocedure Evaluation (Signed)
Anesthesia Evaluation  Patient identified by MRN, date of birth, ID band Patient awake    Reviewed: Allergy & Precautions, H&P , NPO status , reviewed documented beta blocker date and time   Airway Mallampati: II  TM Distance: >3 FB Neck ROM: full    Dental  (+) Poor Dentition, Chipped, Missing, Edentulous Upper   Pulmonary COPD, Current Smoker and Patient abstained from smoking.,     + decreased breath sounds      Cardiovascular + Peripheral Vascular Disease  Normal cardiovascular exam     Neuro/Psych PSYCHIATRIC DISORDERS Anxiety Depression    GI/Hepatic GERD  Controlled and Medicated,  Endo/Other  diabetes, Poorly Controlled, Type 1, Insulin Dependent  Renal/GU      Musculoskeletal   Abdominal   Peds  Hematology   Anesthesia Other Findings Past Medical History: No date: Anxiety No date: COPD (chronic obstructive pulmonary disease) (HCC) No date: Diabetes mellitus without complication (HCC)     Comment:  type 2 No date: GERD (gastroesophageal reflux disease) No date: Hyperlipidemia Past Surgical History: No date: APPENDECTOMY No date: EYE SURGERY; Bilateral     Comment:  cataract BMI    Body Mass Index: 15.92 kg/m     Reproductive/Obstetrics                             Anesthesia Physical Anesthesia Plan  ASA: IV  Anesthesia Plan: General   Post-op Pain Management:    Induction: Intravenous  PONV Risk Score and Plan: 2 and 1 and Ondansetron and Midazolam  Airway Management Planned: Oral ETT  Additional Equipment:   Intra-op Plan:   Post-operative Plan: Extubation in OR  Informed Consent: I have reviewed the patients History and Physical, chart, labs and discussed the procedure including the risks, benefits and alternatives for the proposed anesthesia with the patient or authorized representative who has indicated his/her understanding and acceptance.     Dental  Advisory Given  Plan Discussed with: CRNA  Anesthesia Plan Comments:         Anesthesia Quick Evaluation

## 2019-06-14 ENCOUNTER — Ambulatory Visit
Admission: RE | Admit: 2019-06-14 | Discharge: 2019-06-14 | Disposition: A | Discharge status: Home / Self Care | Payer: Medicare Other | Source: Ambulatory Visit | Attending: Oncology | Admitting: Oncology

## 2019-06-14 DIAGNOSIS — Z79899 Other long term (current) drug therapy: Secondary | ICD-10-CM | POA: Insufficient documentation

## 2019-06-14 DIAGNOSIS — R918 Other nonspecific abnormal finding of lung field: Secondary | ICD-10-CM

## 2019-06-14 LAB — GLUCOSE, CAPILLARY: Glucose-Capillary: 307 mg/dL — ABNORMAL HIGH (ref 70–99)

## 2019-06-14 NOTE — Op Note (Signed)
  [facility]            Bronchoscopy Note   FINDINGS/SUMMARY:   --EBUS guided needle biopsy taken of the right paratracheal node, followed by needle biopsy of the right paratracheal mass. 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 - Partial (50%) occlusion of the anterior segment of the right upper lobe. - Transbronchial brushings taken under fluoroscopic guidance from the posterior segment of the left upper lobe, followed by bronchoalveolar lavage from this area.  Indication: lung mass. The patient (or their representative) was informed of the risks (including but not limited to bleeding, infection, respiratory failure, lung injury, tooth/oral injury) and benefits of the procedure and gave consent, see chart.   Pre-op diagnosis: Lung mass Post-op diagnosis: Lung mass Estimated blood loss: 10 cc.  Medications for procedure: Please see anesthesia notes.  Procedure description: After obtaining informed consent a timeout was called to confirm the patient and the procedure.  Anesthesia services was present and intubated the patient, please see their notes for further details.  The EBUS scope was passed by the endotracheal tube, taken to the right paratracheal lymph node station, or a small lymph node was visualized anteriorly.  This appeared to be contiguous and crossed the midline.  Endobronchial ultrasound-guided needle biopsy was taken at this lesion with good returns.  Subsequently the scope was taken to the right lateral aspect of the lower one third of the trachea, here the large paratracheal mass that was appreciated on CT scan was visualized.  Several passes were taken with good returns.  The EBUS scope was then removed. The white light bronchoscope was then passed via the endotracheal tube, an anatomical tour was undertaken, all segments  were visualized, there was moderate mucosal secretions bilaterally which were suctioned and removed. On examination of the right upper lobe, the apical segment appeared to have complete occlusion, neither the scope nor other instruments could be passed beyond.  Topical epinephrine was applied.  Forceps biopsies were taken, followed by brushings, followed by bronchoalveolar lavage.  The apical segment appeared to have small endobronchial lesion with approximately 50% occlusion.  Brushings of the right upper lobe include brushings of this lesion. The bronchoscope was then taken to the left upper lobe, where under fluoroscopic guidance transbronchial brushings were taken from the posterior segment of the left upper lobe.  As adequate specimens have been obtained at that time, the bronchoscope was removed, patient was extubated by anesthesia and taken to recovery.    Condition post procedure: Stable   Complications: None noted.   Marda Stalker, M.D., F.C.C.P. Board Certified in Internal Medicine, Pulmonary Medicine, [place], and Sleep Medicine.  [facility] Office Number: [phone]  06/14/2019

## 2019-06-15 ENCOUNTER — Other Ambulatory Visit: Payer: Self-pay | Admitting: Oncology

## 2019-06-15 ENCOUNTER — Other Ambulatory Visit: Payer: Self-pay

## 2019-06-15 ENCOUNTER — Encounter
Admission: RE | Admit: 2019-06-15 | Discharge: 2019-06-15 | Disposition: A | Discharge status: Home / Self Care | Payer: Medicare Other | Source: Ambulatory Visit | Attending: Oncology | Admitting: Oncology

## 2019-06-15 DIAGNOSIS — N400 Enlarged prostate without lower urinary tract symptoms: Secondary | ICD-10-CM | POA: Insufficient documentation

## 2019-06-15 DIAGNOSIS — F17210 Nicotine dependence, cigarettes, uncomplicated: Secondary | ICD-10-CM | POA: Diagnosis not present

## 2019-06-15 DIAGNOSIS — K219 Gastro-esophageal reflux disease without esophagitis: Secondary | ICD-10-CM | POA: Diagnosis not present

## 2019-06-15 DIAGNOSIS — Z7982 Long term (current) use of aspirin: Secondary | ICD-10-CM | POA: Insufficient documentation

## 2019-06-15 DIAGNOSIS — I712 Thoracic aortic aneurysm, without rupture: Secondary | ICD-10-CM | POA: Diagnosis not present

## 2019-06-15 DIAGNOSIS — E785 Hyperlipidemia, unspecified: Secondary | ICD-10-CM | POA: Insufficient documentation

## 2019-06-15 DIAGNOSIS — R918 Other nonspecific abnormal finding of lung field: Secondary | ICD-10-CM | POA: Insufficient documentation

## 2019-06-15 DIAGNOSIS — Z79899 Other long term (current) drug therapy: Secondary | ICD-10-CM | POA: Insufficient documentation

## 2019-06-15 DIAGNOSIS — I700 Atherosclerosis of aorta: Secondary | ICD-10-CM | POA: Insufficient documentation

## 2019-06-15 DIAGNOSIS — J439 Emphysema, unspecified: Secondary | ICD-10-CM | POA: Insufficient documentation

## 2019-06-15 DIAGNOSIS — Z794 Long term (current) use of insulin: Secondary | ICD-10-CM | POA: Diagnosis not present

## 2019-06-15 DIAGNOSIS — E119 Type 2 diabetes mellitus without complications: Secondary | ICD-10-CM | POA: Insufficient documentation

## 2019-06-15 DIAGNOSIS — R911 Solitary pulmonary nodule: Secondary | ICD-10-CM | POA: Diagnosis not present

## 2019-06-15 LAB — ACID FAST SMEAR (AFB, MYCOBACTERIA): Acid Fast Smear: NEGATIVE

## 2019-06-15 LAB — CYTOLOGY - NON PAP

## 2019-06-15 LAB — SURGICAL PATHOLOGY

## 2019-06-15 LAB — GLUCOSE, CAPILLARY: Glucose-Capillary: 135 mg/dL — ABNORMAL HIGH (ref 70–99)

## 2019-06-15 MED ORDER — FLUDEOXYGLUCOSE F - 18 (FDG) INJECTION
5.9200 | Freq: Once | INTRAVENOUS | Status: AC | PRN
  Administered 2019-06-15: 13:00:00 5.92 via INTRAVENOUS

## 2019-06-16 LAB — CULTURE, BAL-QUANTITATIVE W GRAM STAIN
Culture: 70000 — AB
Special Requests: NORMAL

## 2019-06-16 NOTE — Anesthesia Postprocedure Evaluation (Signed)
Anesthesia Post Note  Patient: Colin Novak  Procedure(s) Performed: ENDOBRONCHIAL ULTRASOUND, BILATERAL (Bilateral )  Patient location during evaluation: PACU Anesthesia Type: General Level of consciousness: awake and alert Pain management: pain level controlled Vital Signs Assessment: post-procedure vital signs reviewed and stable Respiratory status: spontaneous breathing, nonlabored ventilation, respiratory function stable and patient connected to nasal cannula oxygen Cardiovascular status: blood pressure returned to baseline and stable Postop Assessment: no apparent nausea or vomiting Anesthetic complications: no     Last Vitals:  Vitals:   06/13/19 1519 06/13/19 1529  BP: (!) 120/59 126/61  Pulse: 65 73  Resp: 17 18  Temp:  (!) 36.2 C  SpO2: 99% 100%    Last Pain:  Vitals:   06/14/19 0810  TempSrc:   PainSc: 0-No pain                 Alphonsus Sias

## 2019-06-17 ENCOUNTER — Other Ambulatory Visit: Payer: Self-pay

## 2019-06-17 ENCOUNTER — Ambulatory Visit
Admission: RE | Admit: 2019-06-17 | Discharge: 2019-06-17 | Disposition: A | Discharge status: Home / Self Care | Payer: Medicare Other | Source: Ambulatory Visit | Attending: Oncology | Admitting: Oncology

## 2019-06-17 DIAGNOSIS — I9589 Other hypotension: Secondary | ICD-10-CM | POA: Diagnosis not present

## 2019-06-17 DIAGNOSIS — G9389 Other specified disorders of brain: Secondary | ICD-10-CM | POA: Diagnosis not present

## 2019-06-17 MED ORDER — GADOBUTROL 1 MMOL/ML IV SOLN
4.0000 mL | Freq: Once | INTRAVENOUS | Status: AC | PRN
  Administered 2019-06-17: 4 mL via INTRAVENOUS

## 2019-06-20 ENCOUNTER — Encounter: Payer: Self-pay | Admitting: Oncology

## 2019-06-20 ENCOUNTER — Telehealth (INDEPENDENT_AMBULATORY_CARE_PROVIDER_SITE_OTHER): Payer: Self-pay

## 2019-06-20 ENCOUNTER — Inpatient Hospital Stay: Payer: Medicare Other | Admitting: Oncology

## 2019-06-20 ENCOUNTER — Other Ambulatory Visit: Payer: Self-pay

## 2019-06-20 ENCOUNTER — Encounter: Payer: Self-pay | Admitting: *Deleted

## 2019-06-20 VITALS — BP 100/62 | HR 80 | Temp 96.3°F | Resp 18 | Wt 111.0 lb

## 2019-06-20 DIAGNOSIS — F419 Anxiety disorder, unspecified: Secondary | ICD-10-CM | POA: Diagnosis not present

## 2019-06-20 DIAGNOSIS — E46 Unspecified protein-calorie malnutrition: Secondary | ICD-10-CM | POA: Diagnosis not present

## 2019-06-20 DIAGNOSIS — Z7982 Long term (current) use of aspirin: Secondary | ICD-10-CM | POA: Diagnosis not present

## 2019-06-20 DIAGNOSIS — Z794 Long term (current) use of insulin: Secondary | ICD-10-CM | POA: Diagnosis not present

## 2019-06-20 DIAGNOSIS — R590 Localized enlarged lymph nodes: Secondary | ICD-10-CM | POA: Diagnosis not present

## 2019-06-20 DIAGNOSIS — R918 Other nonspecific abnormal finding of lung field: Secondary | ICD-10-CM | POA: Diagnosis not present

## 2019-06-20 DIAGNOSIS — R634 Abnormal weight loss: Secondary | ICD-10-CM

## 2019-06-20 DIAGNOSIS — Z7189 Other specified counseling: Secondary | ICD-10-CM | POA: Diagnosis not present

## 2019-06-20 DIAGNOSIS — C3490 Malignant neoplasm of unspecified part of unspecified bronchus or lung: Secondary | ICD-10-CM | POA: Insufficient documentation

## 2019-06-20 DIAGNOSIS — R630 Anorexia: Secondary | ICD-10-CM | POA: Diagnosis not present

## 2019-06-20 DIAGNOSIS — K219 Gastro-esophageal reflux disease without esophagitis: Secondary | ICD-10-CM | POA: Diagnosis not present

## 2019-06-20 DIAGNOSIS — E118 Type 2 diabetes mellitus with unspecified complications: Secondary | ICD-10-CM | POA: Diagnosis not present

## 2019-06-20 DIAGNOSIS — Z79899 Other long term (current) drug therapy: Secondary | ICD-10-CM | POA: Diagnosis not present

## 2019-06-20 DIAGNOSIS — E785 Hyperlipidemia, unspecified: Secondary | ICD-10-CM | POA: Diagnosis not present

## 2019-06-20 DIAGNOSIS — I9589 Other hypotension: Secondary | ICD-10-CM | POA: Diagnosis not present

## 2019-06-20 DIAGNOSIS — Z515 Encounter for palliative care: Secondary | ICD-10-CM | POA: Diagnosis not present

## 2019-06-20 DIAGNOSIS — R64 Cachexia: Secondary | ICD-10-CM | POA: Diagnosis not present

## 2019-06-20 DIAGNOSIS — E43 Unspecified severe protein-calorie malnutrition: Secondary | ICD-10-CM | POA: Diagnosis not present

## 2019-06-20 DIAGNOSIS — I712 Thoracic aortic aneurysm, without rupture: Secondary | ICD-10-CM | POA: Diagnosis not present

## 2019-06-20 DIAGNOSIS — J449 Chronic obstructive pulmonary disease, unspecified: Secondary | ICD-10-CM | POA: Diagnosis not present

## 2019-06-20 DIAGNOSIS — I871 Compression of vein: Secondary | ICD-10-CM | POA: Diagnosis not present

## 2019-06-20 DIAGNOSIS — F17210 Nicotine dependence, cigarettes, uncomplicated: Secondary | ICD-10-CM | POA: Diagnosis not present

## 2019-06-20 MED ORDER — ONDANSETRON HCL 8 MG PO TABS: 8.0000 mg | ORAL_TABLET | Freq: Two times a day (BID) | ORAL | 1 refills | Status: DC | PRN

## 2019-06-20 MED ORDER — PROCHLORPERAZINE MALEATE 10 MG PO TABS: 10.0000 mg | ORAL_TABLET | Freq: Four times a day (QID) | ORAL | 1 refills | Status: DC | PRN

## 2019-06-20 MED ORDER — LIDOCAINE-PRILOCAINE 2.5-2.5 % EX CREA: TOPICAL_CREAM | CUTANEOUS | 3 refills | Status: DC

## 2019-06-20 MED ORDER — FOLIC ACID 1 MG PO TABS: 1.0000 mg | ORAL_TABLET | Freq: Every day | ORAL | 3 refills | Status: DC

## 2019-06-20 MED ORDER — DEXAMETHASONE 4 MG PO TABS: ORAL_TABLET | ORAL | 1 refills | Status: DC

## 2019-06-20 NOTE — Progress Notes (Signed)
  Oncology Nurse Navigator Documentation  Navigator Location: [facility] Onc (06/20/19 1600)   )Navigator Encounter Type: Telephone (06/20/19 1600) Telephone: [place] Call (06/20/19 1600)                   Patient Visit Type: MedOnc (06/20/19 1600) Treatment Phase: Pre-Tx/Tx Discussion (06/20/19 1600) Barriers/Navigation Needs: Education;Coordination of Care (06/20/19 1600) Education: Newly Diagnosed Cancer Education;Understanding Cancer/ Treatment Options (06/20/19 1600) Interventions: Education;Referrals (06/20/19 1600) Referrals: Palliative Care (06/20/19 1600)   Education Method: Verbal;Written (06/20/19 1600)         phone call made to pt's daughter, Colin Novak, to follow up on clinic visit with Dr. Tasia Catchings this morning. Had lengthy conversation with patient's daughter. All questions answered. Reassurance provided. Educational materials regarding diagnosis and supportive services mailed to patient. Nothing further needed at this time.       Time Spent with Patient: 45 (06/20/19 1600)

## 2019-06-20 NOTE — Telephone Encounter (Signed)
I attempted to contact the patients daughter regarding getting the patient scheduled for a port placement. A message was left for a return call.

## 2019-06-20 NOTE — Progress Notes (Signed)
START ON PATHWAY REGIMEN - Non-Small Cell Lung     A cycle is every 21 days:     Pemetrexed      Carboplatin   **Always confirm dose/schedule in your pharmacy ordering system**  Patient Characteristics: Stage IV Metastatic, Nonsquamous, Initial Chemotherapy/Immunotherapy, PS = 2, ALK or EGFR or ROS1 or NTRK Genomic Alterations - Awaiting Test Results AJCC T Category: TX Current Disease Status: Distant Metastases AJCC N Category: N1 AJCC M Category: M1a AJCC 8 Stage Grouping: IVA Histology: Nonsquamous Cell ROS1 Rearrangement Status: Awaiting Test Results T790M Mutation Status: Not Applicable - EGFR Mutation Negative/Unknown Other Mutations/Biomarkers: No Other Actionable Mutations NTRK Gene Fusion Status: Awaiting Test Results PD-L1 Expression Status: Awaiting Test Results Chemotherapy/Immunotherapy LOT: Initial Chemotherapy/Immunotherapy Molecular Targeted Therapy: Not Appropriate ALK Rearrangement Status: Awaiting Test Results EGFR Mutation Status: Awaiting Test Results BRAF V600E Mutation Status: Awaiting Test Results ECOG Performance Status: 2 Intent of Therapy: Non-Curative / Palliative Intent, Discussed with Patient

## 2019-06-20 NOTE — Progress Notes (Signed)
Here to discuss MRI and PET scan results.  Reports neck pain since he had the MRI.

## 2019-06-20 NOTE — Progress Notes (Signed)
Hematology/Oncology Consult note [facility] Telephone:([phone] [phone]   Patient Care Team: Guadalupe Maple, MD as PCP - General (Family Medicine) Gabriel Carina, Betsey Holiday, MD as Physician Assistant (Endocrinology) Kem Parkinson, MD (Ophthalmology) Telford Nab, RN as Registered Nurse  REFERRING PROVIDER: Guadalupe Maple, MD  CHIEF COMPLAINTS/REASON FOR VISIT:  Evaluation of lung mass  HISTORY OF PRESENTING ILLNESS:  Colin Novak is a  73 y.o.  male with PMH listed below was seen in consultation at the request of  Guadalupe Maple, MD  for evaluation of lung mass Patient reports ongoing shortness of breath, cough for  few months. He had CT chest scan done on 06/01/2019 which showed a concerning feature of 9.1 x 5.1 x 3.6 cm right upper lobe mass extending into mediastinum, encasing and occluding right upper lobe bronchus and some of the right upper lobe bronchial branches.  There is also significant narrowing of FVC. He also had a similar appearance of left upper lobe mass without mediastinal extension suspicious for a synchronized primary lung cancer Multiple additional smaller bilateral lung nodules, left greater than right. Mildly enlarged right hilar and mediastinal lymph nodes suspicious for metastatic lymphadenopathy. 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  Patient endorses decreased appetite, significant weight loss. He feels weak and shaky.  Also reports some middle chest discomfort, no exacerbating or alleviating factors. Lives by himself. Daughter [name] is his power of attorney. Denies fever or chills.  INTERVAL HISTORY Colin Novak is a 73 y.o. male who has above history reviewed by me today presents for follow up visit for management of newly diagnosed non-small cell lung cancer. Problems and complaints are listed below: During the  interval patient underwent right upper lobe lung mass biopsy via bronchoscopy by Dr. Felicie Morn. Pathology came back positive for non-small cell carcinoma of lung origin. Patient had PET scan done on 06/15/2019, PET scan images were independently reviewed by me. Right upper lobe masslike consolidation 3.8 x 5.1 cm, SUV 7.1.-Obstructs right upper lobe bronchus There is a separate left upper lobe mass 2.4 cm with an SUV of 6.4. 12 mm spiculated nodule in the left lower lobe has an SUV of 1.1. Low right paratracheal lymph node measuring 7 mm with SUV of 2.8.  No hypermetabolic hilar or axillary lymph nodes. MRI showed no evidence of brain metastasis.  There is a 14 mm left parietal bone lesion has benign features.  No osseous metastasis on prior PET scan.  Patient present to discuss pathology results, images and management plan. He reports no pain today.  Breathing is at baseline.  No new complaints today.  Denies any dizziness or lightheadedness today.  Review of Systems  Constitutional: Positive for appetite change, fatigue and unexpected weight change. Negative for chills and fever.  HENT:   Negative for hearing loss, lump/mass and voice change.   Eyes: Negative for eye problems and icterus.  Respiratory: Positive for cough and shortness of breath. Negative for chest tightness.   Cardiovascular: Negative for chest pain and leg swelling.  Gastrointestinal: Negative for abdominal distention and abdominal pain.  Endocrine: Negative for hot flashes.  Genitourinary: Negative for difficulty urinating, dysuria and frequency.   Musculoskeletal: Negative for arthralgias and back pain.  Skin: Negative for itching and rash.  Neurological: Negative for headaches, light-headedness and numbness.  Hematological: Negative for adenopathy. Does not bruise/bleed easily.  Psychiatric/Behavioral: Negative for confusion. The patient  is not nervous/anxious.     MEDICAL HISTORY:  Past Medical History:    Diagnosis Date   Anxiety    COPD (chronic obstructive pulmonary disease) ([place])    Diabetes mellitus without complication ([place])    type 2   GERD (gastroesophageal reflux disease)    Hyperlipidemia     SURGICAL HISTORY: Past Surgical History:  Procedure Laterality Date   APPENDECTOMY     ENDOBRONCHIAL ULTRASOUND Bilateral 06/13/2019   Procedure: ENDOBRONCHIAL ULTRASOUND, BILATERAL;  Surgeon: [name], MD;  Location: [facility] ORS;  Service: Pulmonary;  Laterality: Bilateral;   EYE SURGERY Bilateral    cataract    SOCIAL HISTORY: Social History   Socioeconomic History   Marital status: Widowed    Spouse name: Not on file   Number of children: 1   Years of education: Not on file   Highest education level: High school graduate  Occupational History   Occupation: retired  Scientist, product/process development strain: Not very hard   Food insecurity    Worry: Never true    Inability: Never true   Transportation needs    Medical: No    Non-medical: No  Tobacco Use   Smoking status: Current Every Day Smoker    Packs/day: 1.00    Types: Cigarettes   Smokeless tobacco: Former Systems developer    Types: Chew    Quit date: 06/10/1959   Tobacco comment: 1 pack daily- 06/09/2019  Substance and Sexual Activity   Alcohol use: No   Drug use: No   Sexual activity: Not Currently  Lifestyle   Physical activity    Days per week: 0 days    Minutes per session: 0 min   Stress: Not at all  Relationships   Social connections    Talks on phone: Once a week    Gets together: Never    Attends religious service: Never    Active member of club or organization: No    Attends meetings of clubs or organizations: Never    Relationship status: Widowed   Intimate partner violence    Fear of current or ex partner: No    Emotionally abused: No    Physically abused: No    Forced sexual activity: No  Other Topics Concern   Not on file  Social History Narrative   Not  on file    FAMILY HISTORY: Family History  Problem Relation Age of Onset   Cancer Mother        lung (non smoker)   Diabetes Father    Hypertension Father    Lymphoma Brother    Prostate cancer Brother     ALLERGIES:  is allergic to penicillins.  MEDICATIONS:  Current Outpatient Medications  Medication Sig Dispense Refill   albuterol (PROAIR HFA) 108 (90 Base) MCG/ACT inhaler Inhale 1-2 puffs into the lungs every 6 (six) hours as needed for wheezing or shortness of breath (use as needed for chest congestion.). 1 g 3   aspirin EC 81 MG tablet Take 81 mg by mouth daily.     citalopram (CELEXA) 20 MG tablet Take 1 tablet (20 mg total) by mouth daily. 90 tablet 4   Dextromethorphan-guaiFENesin (ROBITUSSIN DM PO) Take 5 mLs by mouth at bedtime.      ezetimibe-simvastatin (VYTORIN) 10-40 MG tablet Take 1 tablet by mouth daily. 90 tablet 4   Fluticasone-Umeclidin-Vilant (TRELEGY ELLIPTA) 100-62.5-25 MCG/INH AEPB Inhale 1 applicator into the lungs daily. Rinse mouth after use. 1 each 10   glucose blood (  ONE TOUCH ULTRA TEST) test strip 1 each by Other route 2 (two) times daily as needed for other. Dx: E11.9 100 each 12   HUMALOG KWIKPEN 100 UNIT/ML KwikPen INJECT UP TO 50 UNITS DAILY IN DIVIDED DOSES AS DIRECTED     LANTUS SOLOSTAR 100 UNIT/ML Solostar Pen INJECT 10-20 UNITS INTO THE SKIN DAILY (Patient taking differently: Inject 12 Units into the skin daily. ) 5 pen 12   metFORMIN (GLUCOPHAGE) 500 MG tablet TAKE ONE TABLET BY MOUTH TWICE DAILY (Patient taking differently: Take 1,000 mg by mouth 2 (two) times daily. ) 60 tablet 12   Multiple Vitamin (MULTIVITAMIN) tablet Take 1 tablet by mouth daily.     NOVOFINE 32G X 6 MM MISC USE AS DIRECTED. 5 INJECTIONS A DAY 900 each 0   omeprazole (PRILOSEC) 20 MG capsule Take 1 capsule (20 mg total) by mouth daily. 30 capsule 11   oxymetazoline (AFRIN) 0.05 % nasal spray Place 2 sprays into both nostrils 2 (two) times daily as  needed.      pioglitazone (ACTOS) 30 MG tablet Take 30 mg by mouth daily.   1   pseudoephedrine-acetaminophen (TYLENOL SINUS) 30-500 MG TABS tablet Take 2 tablets by mouth every 4 (four) hours as needed. Only using twice daily     insulin aspart (NOVOLOG) 100 UNIT/ML injection Inject into the skin 3 (three) times daily before meals. Sliding scale     triamcinolone (NASACORT) 55 MCG/ACT AERO nasal inhaler Place 1 spray into the nose 2 (two) times daily. (Patient not taking: Reported on 06/10/2019) 1 Inhaler 2   No current facility-administered medications for this visit.      PHYSICAL EXAMINATION: ECOG PERFORMANCE STATUS: 2 - Symptomatic, <50% confined to bed Vitals:   06/20/19 0912  BP: 100/62  Pulse: 80  Resp: 18  Temp: (!) 96.3 F (35.7 C)   Filed Weights   06/20/19 0912  Weight: 111 lb (50.3 kg)    Physical Exam Constitutional:      General: He is not in acute distress.    Appearance: He is ill-appearing.     Comments: Cachectic  HENT:     Head: Normocephalic and atraumatic.  Eyes:     General: No scleral icterus.    Pupils: Pupils are equal, round, and reactive to light.  Neck:     Musculoskeletal: Normal range of motion and neck supple.     Comments: No neck swelling Cardiovascular:     Rate and Rhythm: Normal rate and regular rhythm.     Heart sounds: Normal heart sounds.  Pulmonary:     Effort: Pulmonary effort is normal. No respiratory distress.     Comments: Bibasilar crackles Abdominal:     General: Bowel sounds are normal. There is no distension.     Palpations: Abdomen is soft. There is no mass.  Musculoskeletal: Normal range of motion.        General: No deformity.  Skin:    General: Skin is warm and dry.     Findings: No erythema or rash.  Neurological:     General: No focal deficit present.     Mental Status: He is alert and oriented to person, place, and time.     Cranial Nerves: No cranial nerve deficit.     Coordination: Coordination  normal.  Psychiatric:        Mood and Affect: Mood normal.     LABORATORY DATA:  I have reviewed the data as listed Lab Results  Component Value Date  WBC 6.1 06/07/2019   HGB 14.5 06/07/2019   HCT 41.3 06/07/2019   MCV 95.6 06/07/2019   PLT 219 06/07/2019   Recent Labs    12/06/18 0857 06/07/19 1157  NA 140 132*  K 4.5 4.6  CL 99 95*  CO2 25 27  GLUCOSE 147* 416*  BUN 9 15  CREATININE 0.67* 0.73  CALCIUM 9.4 9.3  GFRNONAA 95 >60  GFRAA 110 >60  PROT 6.4 7.3  ALBUMIN 4.1 4.3  AST 16 26  ALT 15 37  ALKPHOS 62 77  BILITOT <0.2 0.7   Iron/TIBC/Ferritin/ %Sat No results found for: IRON, TIBC, FERRITIN, IRONPCTSAT    RADIOGRAPHIC STUDIES: I have personally reviewed the radiological images as listed and agreed with the findings in the report.  Dg Chest 1 View  Result Date: 06/13/2019 CLINICAL DATA:  Status post lung biopsy EXAM: CHEST  1 VIEW COMPARISON:  CT chest, 06/01/2019 FINDINGS: Emphysema. No significant change in dense, masslike consolidations of the paramedian right upper lobe and central posterior left upper lobe. Numerous additional small pulmonary nodules, primarily of the left lung, are noted although better evaluated by CT. No significant pneumothorax appreciated following biopsy. IMPRESSION: Emphysema. No significant change in dense, masslike consolidations of the paramedian right upper lobe and central posterior left upper lobe. Numerous additional small pulmonary nodules, primarily of the left lung, are noted although better evaluated by CT. No significant pneumothorax appreciated following biopsy. Electronically Signed   By: Eddie Candle M.D.   On: 06/13/2019 15:23   Ct Chest W Contrast  Addendum Date: 06/01/2019   ADDENDUM REPORT: 06/01/2019 16:25 ADDENDUM: 4.1 cm ascending thoracic aortic aneurysm and aneurysmal dilatation of the descending thoracic aorta measuring 4.0 cm. Recommend annual imaging followup by CTA or MRA. This recommendation follows 2010  ACCF/AHA/AATS/ACR/ASA/SCA/SCAI/SIR/STS/SVM Guidelines for the Diagnosis and Management of Patients with Thoracic Aortic Disease. Circulation. 2010; 121: V784-O962. Aortic aneurysm NOS (ICD10-I71.9) Electronically Signed   By: Claudie Revering M.D.   On: 06/01/2019 16:25   Result Date: 06/01/2019 CLINICAL DATA:  Smoker. Medial right upper lobe mass or right paratracheal adenopathy on recent chest radiographs at [facility]. EXAM: CT CHEST WITH CONTRAST TECHNIQUE: Multidetector CT imaging of the chest was performed during intravenous contrast administration. CONTRAST:  22mL OMNIPAQUE IOHEXOL 300 MG/ML  SOLN COMPARISON:  Chest CT report dated 07/25/2013. Chest radiographs report from [facility] on 05/23/2019. FINDINGS: Cardiovascular: Atheromatous calcifications, including the coronary arteries and aorta. Mildly dilated ascending thoracic aorta with a maximum diameter of 4.1 cm on image number 83 series 2. The descending thoracic aorta is also mildly dilated, measuring 4.0 cm on image number 114 series 2. The aortic arch is less prominent, measuring 3.3 cm on image number 54 series 2. Mediastinum/Nodes: Heterogeneous mass in medial aspect of the right upper lobe and extending into the adjacent mediastinum, encasing and causing marked narrowing of the superior vena cava. This mass measures 5.1 x 3.6 cm on image number 54 series 2. This measures 9.1 cm in length on coronal image number 47. This is encasing and occluding the right upper lobe bronchus and some of the right upper lobe bronchial branches. A precarinal node is mildly enlarged with a short axis diameter of 9 mm on image number 68 series 2. A right hilar node is enlarged with a short axis diameter 11 mm on image number 79 series 2. Lungs/Pleura: Extensive bilateral bullous changes. Previously described 9.1 x 5.1 x 3.6 cm right upper lobe mass extending into the mediastinum. Similar-appearing left upper  lobe mass without mediastinal extension. This measures 2.3 x 2.2 cm on  image number 35 series 3 and 4.1 cm in length on coronal image number 64. Multiple additional smaller nodules in both lungs, left greater than right. The next largest is in the left lower lobe, measuring 1.4 x 0.9 cm on image number 82 series 3. No pleural fluid. Upper Abdomen: Diffuse low density of the liver relative to the spleen. Mild diffuse bilateral adrenal hypertrophy with no focal adrenal mass is seen. Small left lobe liver cyst. Musculoskeletal: Approximately 25% old L2 vertebral compression deformity with no acute fracture lines. Approximately 10% old T12 superior endplate compression deformity with Schmorl's node formation and no acute fracture lines. IMPRESSION: 1. 9.1 x 5.1 x 3.6 cm right upper lobe mass extending into the mediastinum, encasing and occluding the right upper lobe bronchus and some of the right upper lobe bronchial branches. This is compatible with a primary lung carcinoma. 2. Similar-appearing left upper lobe mass without mediastinal extension suspicious for a synchronous primary lung cancer. 3. Multiple additional smaller bilateral lung nodules, left greater than right. These could represent metastases and possible additional synchronous primary lung carcinomas. 4. Mildly enlarged right hilar and mediastinal lymph nodes, suspicious for metastatic adenopathy. 5. Extensive emphysematous changes. 6.  Calcific coronary artery and aortic atherosclerosis. 7. Diffuse hepatic steatosis. 8. Old T12 and L2 vertebral compression fractures. Aortic Atherosclerosis (ICD10-I70.0) and Emphysema (ICD10-J43.9). Electronically Signed: By: Claudie Revering M.D. On: 06/01/2019 16:15   Mr Jeri Cos TI Contrast  Result Date: 06/17/2019 CLINICAL DATA:  Lung nodule.  Staging. EXAM: MRI HEAD WITHOUT AND WITH CONTRAST TECHNIQUE: Multiplanar, multiecho pulse sequences of the brain and surrounding structures were obtained without and with intravenous contrast. CONTRAST:  4 cc Gadavist intravenous COMPARISON:  None.  FINDINGS: Brain: No enhancement or swelling to suggest metastatic disease. Small vessel ischemic gliosis in the deep cerebral white matter. 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 Moderate volume loss with central predilection. Vascular: Major flow voids and vascular enhancements are preserved. Skull and upper cervical spine: 14 mm left parietal bone lesion has a circumscribed peripherally sclerotic appearance on axial T2 weighted imaging with possible internal stippling-benign appearing and possibly a atypical hemangioma. Nonenhancing T2 hyperintensity in the left parietal bone at the vertex. Sinuses/Orbits: Negative IMPRESSION: 1. No evidence of brain metastasis. 2. 14 mm left parietal bone lesion has benign features and no osseous metastatic disease with seen on prior PET-CT. 3. Atrophy and chronic small vessel ischemic injury. Electronically Signed   By: Monte Fantasia M.D.   On: 06/17/2019 21:27   Nm Pet Image Initial (pi) Skull Base To Thigh  Result Date: 06/15/2019 CLINICAL DATA:  Initial treatment strategy for lung nodule. EXAM: NUCLEAR MEDICINE PET SKULL BASE TO THIGH TECHNIQUE: 5.9 mCi F-18 FDG was injected intravenously. Full-ring PET imaging was performed from the skull base to thigh after the radiotracer. CT data was obtained and used for attenuation correction and anatomic localization. Fasting blood glucose: 135 mg/dl COMPARISON:  CT chest 06/01/2019 and 11/04/2005. FINDINGS: Mediastinal blood pool activity: SUV max 2.1 Liver activity: SUV max NA NECK: No abnormal hypermetabolism. Incidental CT findings: none CHEST: Low right paratracheal lymph node measures 7 mm (4/94) with an SUV max of 2.8. No hypermetabolic hilar or axillary lymph nodes. A spiculated nodule in the apical left upper lobe measures 2.4 cm and has an SUV max of 6.4. Rounded masslike  consolidation in the medial right  upper lobe measures 3.8 x 5.1 cm and has an SUV max of 7.1. A 12 mm (9 x 14 mm) spiculated nodule in the left lower lobe (4/105) has an SUV max of 1.1. Additional smaller bilateral pulmonary nodules are too small for PET resolution. Incidental CT findings: Right upper lobe masslike consolidation obstructs the right upper lobe bronchus. Centrilobular emphysema. Atherosclerotic calcification of the aorta. Ascending aorta measures approximately 4.2 cm. Coronary artery calcification. Heart size normal. No pericardial or pleural effusion. ABDOMEN/PELVIS: No abnormal hypermetabolism in the liver, adrenal glands, spleen or pancreas. No hypermetabolic lymph nodes. Incidental CT findings: Subcentimeter low-attenuation lesion in the liver is too small to characterize. Liver, gallbladder, adrenal glands, kidneys, spleen, pancreas, stomach and bowel are grossly unremarkable. Prostate is enlarged and indents the bladder. SKELETON: No abnormal osseous hypermetabolism. Incidental CT findings: None. IMPRESSION: 1. Hypermetabolic right upper lobe masslike consolidation and left lung nodules, most consistent with primary bronchogenic carcinoma. At least 1 mildly hypermetabolic low right paratracheal lymph node. No evidence of distant metastatic disease. Additional bilateral pulmonary nodules are too small for PET resolution. 2. Enlarged prostate. 3.  Emphysema (ICD10-J43.9). 4. Ascending Aortic aneurysm NOS (ICD10-I71.9). 5.  Aortic atherosclerosis (ICD10-170.0). Electronically Signed   By: Lorin Picket M.D.   On: 06/15/2019 14:39   Dg C-arm 1-60 Min-no Report  Result Date: 06/13/2019 Fluoroscopy was utilized by the requesting physician.  No radiographic interpretation.      ASSESSMENT & PLAN:  1. Non-small cell lung cancer, unspecified laterality ([place])   2. Weight loss, unintentional   3. Protein-calorie malnutrition, unspecified severity ([place])   4. Goals of care, counseling/discussion    Images were independently  reviewed by me  Pathology was reviewed Will need to discuss his case on tumor board. 1 primary vs 2 primaries.  If he has 1 primary, then clinically he has Stage IV lung adenocarcinoma, Clinically Tx N1 M1a given that he has tumor in contralateral lobe. Recommend starting systemic chemotherapy +/- immunotherapy, he may need palliative radiation as right upper lobe mass is obstructing bronchus.  Plan carboplatin plus Alimta next week.   If he may have 2 primaries, will need to discuss on tumor board if he would benefit from definitive chemotherapy and radiation, followed by immunotherapy for maintenance. -If that is the case, will switch to concurrent chemotherapy with [place] and Taxol.  Goal ferritin is discussed, he has incurable condition.  Chemotherapy and other antineoplastic therapy are with palliative intent. I explained to the patient the risks and benefits of chemotherapy including all but not limited to infusion reaction, hair loss, hearing loss, mouth sore, nausea, vomiting, low blood counts, bleeding, heart failure, kidney failure and risk of life threatening infection and even death, secondary malignancy etc.   Patient expresses desire of receiving chemotherapy.  He voices understanding and willing to proceed chemotherapy.   # Chemotherapy education; port placement. Hopefully the planned start chemotherapy next week. 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 #Per patient's request, I called patient's daughter [name] and updated her with above.  Daughter expresses concern of if the patient were able to tolerate chemotherapy and should he consider hospice.  I encourage daughter discussed with patient and clarify what he wants to do.  Will refer to palliative care.   Orders Placed This Encounter  Procedures   Ambulatory referral to Radiation Oncology    Referral Priority:   Routine    Referral Type:  Consultation      Referral Reason:   Specialty Services Required    Referred to Provider:   Noreene Filbert, MD    Requested Specialty:   Radiation Oncology    Number of Visits Requested:   1   Ambulatory referral to Vascular Surgery    Referral Priority:   Routine    Referral Type:   Surgical    Referral Reason:   Specialty Services Required    Referred to Provider:   Algernon Huxley, MD    Requested Specialty:   Vascular Surgery    Number of Visits Requested:   1   Amb Referral to Nutrition and Diabetic E    Referral Priority:   Routine    Referral Type:   Consultation    Referral Reason:   Specialty Services Required    Number of Visits Requested:   1    All questions were answered. The patient knows to call the clinic with any problems questions or concerns.  cc Guadalupe Maple, MD    Return of visit: 1 week.  Thank you for this kind referral and the opportunity to participate in the care of this patient. A copy of today's note is routed to referring provider  Total face to face encounter time for this patient visit was 40 min. >50% of the time was  spent in counseling and coordination of care.    Earlie Server, MD, PhD Hematology Oncology [facility] at [facility] Pager- [phone] 06/20/2019

## 2019-06-21 ENCOUNTER — Telehealth: Payer: Self-pay | Admitting: *Deleted

## 2019-06-21 NOTE — Telephone Encounter (Signed)
I attempted to contact the patient regarding getting scheduled for a port placement. A message was left for his daughter [name] to return a call.

## 2019-06-21 NOTE — Telephone Encounter (Signed)
Pt's daughter left message that pt wishes to decline treatment for lung cancer and be referred to hospice. Dr. Tasia Catchings made aware. All appts have been cancelled at this time. Pt scheduled for telephone visit with Billey Chang, NP to further discuss hospice and help establish next steps. Pt's daughter made aware of telephone visit. Nothing further needed at this time.

## 2019-06-22 ENCOUNTER — Inpatient Hospital Stay: Payer: Medicare Other

## 2019-06-22 ENCOUNTER — Telehealth (INDEPENDENT_AMBULATORY_CARE_PROVIDER_SITE_OTHER): Payer: Self-pay | Admitting: Nurse Practitioner

## 2019-06-22 LAB — FUNGUS CULTURE WITH STAIN

## 2019-06-22 LAB — FUNGAL ORGANISM REFLEX

## 2019-06-22 LAB — FUNGUS CULTURE RESULT

## 2019-06-22 NOTE — Telephone Encounter (Signed)
Called patients daughter [name] to schedule port cath insert. Per patient daughter patient has declined chemo and does not need port. AS, CMA

## 2019-06-23 ENCOUNTER — Ambulatory Visit: Payer: Medicare Other | Admitting: Radiation Oncology

## 2019-06-23 ENCOUNTER — Inpatient Hospital Stay (HOSPITAL_BASED_OUTPATIENT_CLINIC_OR_DEPARTMENT_OTHER): Payer: Medicare Other | Admitting: Hospice and Palliative Medicine

## 2019-06-23 DIAGNOSIS — C3490 Malignant neoplasm of unspecified part of unspecified bronchus or lung: Secondary | ICD-10-CM | POA: Diagnosis not present

## 2019-06-23 DIAGNOSIS — E43 Unspecified severe protein-calorie malnutrition: Secondary | ICD-10-CM

## 2019-06-23 DIAGNOSIS — Z515 Encounter for palliative care: Secondary | ICD-10-CM

## 2019-06-23 NOTE — Progress Notes (Signed)
Virtual Visit via Telephone Note  I connected with Colin Novak on 06/23/19 at  3:00 PM EDT by telephone and verified that I am speaking with the correct person using two identifiers.   I discussed the limitations, risks, security and privacy concerns of performing an evaluation and management service by telephone and the availability of in person appointments. I also discussed with the patient that there may be a patient responsible charge related to this service. The patient expressed understanding and agreed to proceed.   History of Present Illness: Mr. Colin Novak is a 73 year old man with multiple medical problems including stage IV non-small cell lung cancer with plan to start systemic chemotherapy plus minus immunotherapy with possible palliative radiation to the right upper lobe as mass is obstructing his bronchus.  Patient was offered chemotherapy but declined.  There is been discussion of hospice involvement.  Patient was referred to palliative care to help address goals and manage ongoing symptoms.   Observations/Objective: I had a lengthy phone call with patient, daughter, and sister.  Together, we reviewed patient's work-up to date and had an extensive conversation regarding his goals.   Patient maintains strong opposition to the idea of pursuing chemotherapy.  In regards to the cancer, he says "I just wanted to take its course."  Patient tells me that his primary goal is comfort and quality of life over life prolongation.  He is interested in the idea of pursuing RT if radiation oncology feels that treatment would improve his cancer without a taxing symptom burden.  Patient's sister and daughter both want to honor patient's wishes and support him however possible.  We discussed hospice at length.  Family feel that hospice would help provide him support in the home.  They recognize that hospice would not immediately be an option should he opt to pursue radiation.  Assessment and  Plan: Goals of care- recommend best supportive care.  Consideration of RT if patient is agreeable.  Patient will meet with radiation oncology next week to discuss options.  Patient's primary goal is comfort and quality of life.  Recommend future hospice involvement.  Follow Up Instructions: Follow up televisit next week   I discussed the assessment and treatment plan with the patient. The patient was provided an opportunity to ask questions and all were answered. The patient agreed with the plan and demonstrated an understanding of the instructions.   The patient was advised to call back or seek an in-person evaluation if the symptoms worsen or if the condition fails to improve as anticipated.  I provided 60 minutes of non-face-to-face time during this encounter.   Irean Hong, NP

## 2019-06-24 LAB — CYTOLOGY - NON PAP

## 2019-06-27 ENCOUNTER — Other Ambulatory Visit: Payer: Self-pay

## 2019-06-27 ENCOUNTER — Encounter: Payer: Medicare Other | Admitting: Hospice and Palliative Medicine

## 2019-06-27 ENCOUNTER — Inpatient Hospital Stay: Payer: Medicare Other | Admitting: Oncology

## 2019-06-27 ENCOUNTER — Inpatient Hospital Stay: Payer: Medicare Other

## 2019-06-28 ENCOUNTER — Inpatient Hospital Stay: Payer: Medicare Other | Admitting: Nurse Practitioner

## 2019-06-28 ENCOUNTER — Encounter: Payer: Self-pay | Admitting: Radiation Oncology

## 2019-06-28 ENCOUNTER — Ambulatory Visit
Admission: RE | Admit: 2019-06-28 | Discharge: 2019-06-28 | Disposition: A | Discharge status: Home / Self Care | Payer: Medicare Other | Source: Ambulatory Visit | Attending: Radiation Oncology | Admitting: Radiation Oncology

## 2019-06-28 ENCOUNTER — Other Ambulatory Visit: Payer: Self-pay

## 2019-06-28 VITALS — BP 109/68 | HR 88 | Temp 96.8°F | Resp 18 | Wt 113.0 lb

## 2019-06-28 DIAGNOSIS — C3411 Malignant neoplasm of upper lobe, right bronchus or lung: Secondary | ICD-10-CM | POA: Insufficient documentation

## 2019-06-28 DIAGNOSIS — K219 Gastro-esophageal reflux disease without esophagitis: Secondary | ICD-10-CM | POA: Diagnosis not present

## 2019-06-28 DIAGNOSIS — R05 Cough: Secondary | ICD-10-CM | POA: Insufficient documentation

## 2019-06-28 DIAGNOSIS — Z809 Family history of malignant neoplasm, unspecified: Secondary | ICD-10-CM | POA: Insufficient documentation

## 2019-06-28 DIAGNOSIS — Z79899 Other long term (current) drug therapy: Secondary | ICD-10-CM | POA: Insufficient documentation

## 2019-06-28 DIAGNOSIS — I712 Thoracic aortic aneurysm, without rupture: Secondary | ICD-10-CM | POA: Insufficient documentation

## 2019-06-28 DIAGNOSIS — F419 Anxiety disorder, unspecified: Secondary | ICD-10-CM | POA: Diagnosis not present

## 2019-06-28 DIAGNOSIS — Z794 Long term (current) use of insulin: Secondary | ICD-10-CM | POA: Diagnosis not present

## 2019-06-28 DIAGNOSIS — C3490 Malignant neoplasm of unspecified part of unspecified bronchus or lung: Secondary | ICD-10-CM

## 2019-06-28 DIAGNOSIS — J449 Chronic obstructive pulmonary disease, unspecified: Secondary | ICD-10-CM | POA: Diagnosis not present

## 2019-06-28 DIAGNOSIS — E785 Hyperlipidemia, unspecified: Secondary | ICD-10-CM | POA: Diagnosis not present

## 2019-06-28 DIAGNOSIS — R0602 Shortness of breath: Secondary | ICD-10-CM | POA: Diagnosis not present

## 2019-06-28 DIAGNOSIS — Z7982 Long term (current) use of aspirin: Secondary | ICD-10-CM | POA: Insufficient documentation

## 2019-06-28 DIAGNOSIS — C7802 Secondary malignant neoplasm of left lung: Secondary | ICD-10-CM | POA: Diagnosis not present

## 2019-06-28 NOTE — Consult Note (Signed)
NEW PATIENT EVALUATION  Name: Colin Novak  MRN: [id number]  Date:   06/28/2019     DOB: 09/21/1946   This 73 y.o. male patient presents to the clinic for initial evaluation of at least stage III non-small cell lung cancer of the right upper lobe with possible second primary in the left lung field  REFERRING PHYSICIAN: Earlie Server, MD  CHIEF COMPLAINT:  Chief Complaint  Patient presents with  . Lung Cancer    Initial Eval    DIAGNOSIS: The encounter diagnosis was Non-small cell lung cancer, unspecified laterality ([place]).   PREVIOUS INVESTIGATIONS:  CT scan and PET CT and MRI of brain reviewed Pathology report reviewed Clinical notes reviewed  HPI: Patient is a 73 year old male who presented with increasing shortness of breath and dyspnea on exertion and increased cough.  Initial CT scan done in August showed a 9 x 5 cm mass in the right upper lobe extending into the mediastinum encasing and occluding the right upper lobe bronchus and some of the right upper lobe bronchial branches.  There is also significant narrowing of the FVC.  He also had a lesion in his left upper lobe without mediastinal extension suspicious for a synchronous primary lung cancer.  He also set up additional smaller bilateral lung nodules left greater than right.Synthia Innocent PET CT scan showing hypermetabolic right upper lobe mass like consolidation consistent with primary bronchogenic carcinoma.  There is also hypermetabolic left lung nodule consistent with second primary tumor.  There was a least 1 mild hypermetabolic low right paratracheal lymph node.  No evidence of distant metastatic disease was seen.  Additional bilateral pulmonary nodules were too small for PET resolution.  Patient also has a known ascending aortic aneurysm.  Patient underwent biopsy which was positive for non-small cell lung cancer.  MRI of brain was negative for metastatic disease.  He has been seen by medical oncology.  Initially he was going to  be referred to hospice as the patient and daughter adamantly refused chemotherapy he is seen today for radiation oncology opinion.  Patient is doing fairly well specifically denies hemoptysis any dysphasia.  PLANNED TREATMENT REGIMEN: Split course of radiation therapy  PAST MEDICAL HISTORY:  has a past medical history of Anxiety, COPD (chronic obstructive pulmonary disease) ([place]), Diabetes mellitus without complication ([place]), GERD (gastroesophageal reflux disease), and Hyperlipidemia.    PAST SURGICAL HISTORY:  Past Surgical History:  Procedure Laterality Date  . APPENDECTOMY    . ENDOBRONCHIAL ULTRASOUND Bilateral 06/13/2019   Procedure: ENDOBRONCHIAL ULTRASOUND, BILATERAL;  Surgeon: [name], MD;  Location: [facility] ORS;  Service: Pulmonary;  Laterality: Bilateral;  . EYE SURGERY Bilateral    cataract    FAMILY HISTORY: family history includes Cancer in his mother; Diabetes in his father; Hypertension in his father; Lymphoma in his brother; Prostate cancer in his brother.  SOCIAL HISTORY:  reports that he has been smoking cigarettes. He has been smoking about 1.00 pack per day. He quit smokeless tobacco use about 60 years ago.  His smokeless tobacco use included chew. He reports that he does not drink alcohol or use drugs.  ALLERGIES: Penicillins  MEDICATIONS:  Current Outpatient Medications  Medication Sig Dispense Refill  . albuterol (PROAIR HFA) 108 (90 Base) MCG/ACT inhaler Inhale 1-2 puffs into the lungs every 6 (six) hours as needed for wheezing or shortness of breath (use as needed for chest congestion.). 1 g 3  . aspirin EC 81 MG tablet Take 81 mg by mouth daily.    Marland Kitchen  citalopram (CELEXA) 20 MG tablet Take 1 tablet (20 mg total) by mouth daily. 90 tablet 4  . dexamethasone (DECADRON) 4 MG tablet Take 1 tab two times a day the day before Alimta chemo, then take 2 tabs once a day for 3 days starting the day after chemo. 30 tablet 1  . Dextromethorphan-guaiFENesin  (ROBITUSSIN DM PO) Take 5 mLs by mouth at bedtime.     Marland Kitchen ezetimibe-simvastatin (VYTORIN) 10-40 MG tablet Take 1 tablet by mouth daily. 90 tablet 4  . Fluticasone-Umeclidin-Vilant (TRELEGY ELLIPTA) 100-62.5-25 MCG/INH AEPB Inhale 1 applicator into the lungs daily. Rinse mouth after use. 1 each 10  . folic acid (FOLVITE) 1 MG tablet Take 1 tablet (1 mg total) by mouth daily. Start 5-7 days before Alimta chemotherapy. Continue until 21 days after Alimta completed. 100 tablet 3  . glucose blood (ONE TOUCH ULTRA TEST) test strip 1 each by Other route 2 (two) times daily as needed for other. Dx: E11.9 100 each 12  . HUMALOG KWIKPEN 100 UNIT/ML KwikPen INJECT UP TO 50 UNITS DAILY IN DIVIDED DOSES AS DIRECTED    . insulin aspart (NOVOLOG) 100 UNIT/ML injection Inject into the skin 3 (three) times daily before meals. Sliding scale    . LANTUS SOLOSTAR 100 UNIT/ML Solostar Pen INJECT 10-20 UNITS INTO THE SKIN DAILY (Patient taking differently: Inject 12 Units into the skin daily. ) 5 pen 12  . lidocaine-prilocaine (EMLA) cream Apply to affected area once 30 g 3  . metFORMIN (GLUCOPHAGE) 500 MG tablet TAKE ONE TABLET BY MOUTH TWICE DAILY (Patient taking differently: Take 1,000 mg by mouth 2 (two) times daily. ) 60 tablet 12  . Multiple Vitamin (MULTIVITAMIN) tablet Take 1 tablet by mouth daily.    Marland Kitchen NOVOFINE 32G X 6 MM MISC USE AS DIRECTED. 5 INJECTIONS A DAY 900 each 0  . omeprazole (PRILOSEC) 20 MG capsule Take 1 capsule (20 mg total) by mouth daily. 30 capsule 11  . ondansetron (ZOFRAN) 8 MG tablet Take 1 tablet (8 mg total) by mouth 2 (two) times daily as needed (Nausea or vomiting). Start if needed on the third day after chemotherapy. 30 tablet 1  . oxymetazoline (AFRIN) 0.05 % nasal spray Place 2 sprays into both nostrils 2 (two) times daily as needed.     . pioglitazone (ACTOS) 30 MG tablet Take 30 mg by mouth daily.   1  . prochlorperazine (COMPAZINE) 10 MG tablet Take 1 tablet (10 mg total) by mouth  every 6 (six) hours as needed (Nausea or vomiting). 30 tablet 1  . pseudoephedrine-acetaminophen (TYLENOL SINUS) 30-500 MG TABS tablet Take 2 tablets by mouth every 4 (four) hours as needed. Only using twice daily    . triamcinolone (NASACORT) 55 MCG/ACT AERO nasal inhaler Place 1 spray into the nose 2 (two) times daily. (Patient not taking: Reported on 06/10/2019) 1 Inhaler 2   No current facility-administered medications for this encounter.     ECOG PERFORMANCE STATUS:  1 - Symptomatic but completely ambulatory  REVIEW OF SYSTEMS: Patient has lost some weight is somewhat cachectic. Patient denies any weight loss, fatigue, weakness, fever, chills or night sweats. Patient denies any loss of vision, blurred vision. Patient denies any ringing  of the ears or hearing loss. No irregular heartbeat. Patient denies heart murmur or history of fainting. Patient denies any chest pain or pain radiating to her upper extremities. Patient denies any shortness of breath, difficulty breathing at night, cough or hemoptysis. Patient denies any swelling in the  lower legs. Patient denies any nausea vomiting, vomiting of blood, or coffee ground material in the vomitus. Patient denies any stomach pain. Patient states has had normal bowel movements no significant constipation or diarrhea. Patient denies any dysuria, hematuria or significant nocturia. Patient denies any problems walking, swelling in the joints or loss of balance. Patient denies any skin changes, loss of hair or loss of weight. Patient denies any excessive worrying or anxiety or significant depression. Patient denies any problems with insomnia. Patient denies excessive thirst, polyuria, polydipsia. Patient denies any swollen glands, patient denies easy bruising or easy bleeding. Patient denies any recent infections, allergies or URI. Patient "s visual fields have not changed significantly in recent time.   PHYSICAL EXAM: BP 109/68   Pulse 88   Temp (!) 96.8 F  (36 C)   Resp 18   Wt 113 lb (51.3 kg)   BMI 16.69 kg/m  Thin cachectic male in NAD.  Well-developed well-nourished patient in NAD. HEENT reveals PERLA, EOMI, discs not visualized.  Oral cavity is clear. No oral mucosal lesions are identified. Neck is clear without evidence of cervical or supraclavicular adenopathy. Lungs are clear to A&P. Cardiac examination is essentially unremarkable with regular rate and rhythm without murmur rub or thrill. Abdomen is benign with no organomegaly or masses noted. Motor sensory and DTR levels are equal and symmetric in the upper and lower extremities. Cranial nerves II through XII are grossly intact. Proprioception is intact. No peripheral adenopathy or edema is identified. No motor or sensory levels are noted. Crude visual fields are within normal range.  LABORATORY DATA: Pathology report reviewed    RADIOLOGY RESULTS: CT scans and PET CT scans are reviewed as well as MRI of brain   IMPRESSION: [place] stage III non-small cell lung cancer of the right upper lobe with probable second primary tumor of the left lung in 73 year old male  PLAN: At this time I would attack this tumor as 2 separate primary tumors.  His right upper lobe lesion which is been biopsied and is positive for non-small cell lung cancer I would treat and split course fashion.  We will treat to 4000 cGy over 4 weeks using 3-dimensional treatment planning.  I would then reevaluate for response and possible add boost after that if we see good response.  His left lung lesion I can treat with SBRT and that will depend on his tolerance of initial course of radiation.  Risks and benefits of treatment including possible dysphasia fatigue alteration of blood count skin reaction all were discussed in detail with the patient and his daughter.  I met privately with his daughter about his care.  They both seem to comprehend my treatment plan well.  Patient can still is adamant about refusing chemotherapy.  I have  personally set up and ordered CT simulation for tomorrow.  I discussed the case personally with medical oncology.  I would like to take this opportunity to thank you for allowing me to participate in the care of your patient.Noreene Filbert, MD

## 2019-06-29 ENCOUNTER — Other Ambulatory Visit: Payer: Self-pay

## 2019-06-29 ENCOUNTER — Ambulatory Visit
Admission: RE | Admit: 2019-06-29 | Discharge: 2019-06-29 | Disposition: A | Discharge status: Home / Self Care | Payer: Medicare Other | Source: Ambulatory Visit | Attending: Radiation Oncology | Admitting: Radiation Oncology

## 2019-06-29 ENCOUNTER — Other Ambulatory Visit: Payer: Self-pay | Admitting: *Deleted

## 2019-06-29 DIAGNOSIS — Z510 Encounter for antineoplastic radiation therapy: Secondary | ICD-10-CM | POA: Diagnosis not present

## 2019-06-29 DIAGNOSIS — C7802 Secondary malignant neoplasm of left lung: Secondary | ICD-10-CM | POA: Diagnosis not present

## 2019-06-29 DIAGNOSIS — C3411 Malignant neoplasm of upper lobe, right bronchus or lung: Secondary | ICD-10-CM | POA: Diagnosis not present

## 2019-06-29 DIAGNOSIS — C3490 Malignant neoplasm of unspecified part of unspecified bronchus or lung: Secondary | ICD-10-CM

## 2019-06-30 ENCOUNTER — Inpatient Hospital Stay: Payer: Medicare Other | Attending: Hospice and Palliative Medicine | Admitting: Hospice and Palliative Medicine

## 2019-06-30 DIAGNOSIS — C3490 Malignant neoplasm of unspecified part of unspecified bronchus or lung: Secondary | ICD-10-CM | POA: Insufficient documentation

## 2019-06-30 DIAGNOSIS — Z515 Encounter for palliative care: Secondary | ICD-10-CM

## 2019-06-30 NOTE — Progress Notes (Signed)
Virtual Visit via Telephone Note  I connected with Colin Novak on 06/30/19 at  1:00 PM EDT by telephone and verified that I am speaking with the correct person using two identifiers.   I discussed the limitations, risks, security and privacy concerns of performing an evaluation and management service by telephone and the availability of in person appointments. I also discussed with the patient that there may be a patient responsible charge related to this service. The patient expressed understanding and agreed to proceed.   History of Present Illness: Mr. Colin Novak is a 73 year old man with multiple medical problems including stage IV non-small cell lung cancer with plan to start systemic chemotherapy plus minus immunotherapy with possible palliative radiation to the right upper lobe as mass is obstructing his bronchus.  Patient was offered chemotherapy but declined.  There is been discussion of hospice involvement.  Patient was referred to palliative care to help address goals and manage ongoing symptoms.   Observations/Objective: Follow up phone call today. Daughter participated in the call. Patient has decided to pursue RT. He remains ad ament that he would not want chemotherapy. He recognizes that he will decline in time and may need hospice services.   Patient denies any distressing symptoms at present. He remains independent in the home. Oral intake is reportedly good.   Will need to address code status/ACP when patient is seen in the clinic.   He is in agreement with palliative care following him at home.   Assessment and Plan: Stage III-IV NSCLC - patient is starting RT. No distressing symptoms at present. Will refer to home-based palliative care. Likely will need future hospice involvement.   Follow Up Instructions: RTC in 2 weeks   I discussed the assessment and treatment plan with the patient. The patient was provided an opportunity to ask questions and all were answered. The  patient agreed with the plan and demonstrated an understanding of the instructions.   The patient was advised to call back or seek an in-person evaluation if the symptoms worsen or if the condition fails to improve as anticipated.  I provided 15 minutes of non-face-to-face time during this encounter.   Irean Hong, NP

## 2019-07-01 ENCOUNTER — Telehealth: Payer: Self-pay | Admitting: Primary Care

## 2019-07-01 NOTE — Telephone Encounter (Signed)
Called daughter Chandra Batch [facility]) to schedule Palliative Consult, no answer.  Left message with reason for call along with my contact information

## 2019-07-05 DIAGNOSIS — C3411 Malignant neoplasm of upper lobe, right bronchus or lung: Secondary | ICD-10-CM | POA: Diagnosis not present

## 2019-07-05 DIAGNOSIS — C7802 Secondary malignant neoplasm of left lung: Secondary | ICD-10-CM | POA: Diagnosis not present

## 2019-07-05 DIAGNOSIS — Z510 Encounter for antineoplastic radiation therapy: Secondary | ICD-10-CM | POA: Diagnosis not present

## 2019-07-07 ENCOUNTER — Telehealth: Payer: Self-pay | Admitting: Primary Care

## 2019-07-07 ENCOUNTER — Ambulatory Visit
Admission: RE | Admit: 2019-07-07 | Discharge: 2019-07-07 | Disposition: A | Discharge status: Home / Self Care | Payer: Medicare Other | Source: Ambulatory Visit | Attending: Radiation Oncology | Admitting: Radiation Oncology

## 2019-07-07 ENCOUNTER — Other Ambulatory Visit: Payer: Self-pay

## 2019-07-07 DIAGNOSIS — Z510 Encounter for antineoplastic radiation therapy: Secondary | ICD-10-CM | POA: Diagnosis not present

## 2019-07-07 DIAGNOSIS — C3411 Malignant neoplasm of upper lobe, right bronchus or lung: Secondary | ICD-10-CM | POA: Diagnosis not present

## 2019-07-07 NOTE — Telephone Encounter (Signed)
Spoke with patient and after discussing Palliative services he was in agreement with this.  I have scheduled a Telephone Palliative Consult for 07/08/19 @ 9 AM

## 2019-07-08 ENCOUNTER — Other Ambulatory Visit: Payer: Medicare Other | Admitting: Primary Care

## 2019-07-08 ENCOUNTER — Other Ambulatory Visit: Payer: Self-pay | Admitting: Primary Care

## 2019-07-08 DIAGNOSIS — Z515 Encounter for palliative care: Secondary | ICD-10-CM

## 2019-07-08 NOTE — Progress Notes (Signed)
[facility] Palliative Care Consult Note Telephone: [phone]  Fax: [phone]  TELEHEALTH VISIT STATEMENT Due to the COVID-19 crisis, this visit was done via telemedicine from my office. It was initiated and consented to by this patient and/or family.  PATIENT NAME: Colin Novak DOB: Sep 15, 1946 MRN: [id number]  PRIMARY CARE PROVIDER:   Guadalupe Maple, MD, [address] [place] [address] [phone]  REFERRING PROVIDER:  Guadalupe Maple, MD [address] [place],  [place] [address] [phone]  RESPONSIBLE PARTY:   Extended Emergency Contact Information Primary Emergency Contact: Colin Novak Mobile Phone: [phone] Relation: Daughter Secondary Emergency Contact: [facility] Mobile Phone: [phone] Relation: Sister Interpreter needed? No   ASSESSMENT AND RECOMMENDATIONS:   1. Advance Care Planning/Goals of Care: Goals include to maximize quality of life and symptom management. States his daughter is POA, and he wants her to make the decisions. His daughter had an experience with her father in law. Discussed MOST decisions. I will mail a MOST form to their home for them to mail back a copy and keep a copy.  2. Symptom Management:   Nutrition: Taking glucerna. Cooks for self now, has good appetite.States his brothers will help if he needs meal prep later.  Constipation: Endorses occ constipation. I suggested miralax daily. He has some occasional constipation but is able to Rx with OTC.   Pain: Has some chest pains from cancer, in rib areas. Currently taking tylenol now for pain, with good results. Does not need narcotics at this time unless extreme pain. Usually relieved with OTC.  3. Family /Caregiver/Community Supports: States he does not want home visits at this time due to covid. He does not have a smart device. His daughter has one but she's working currently.   4. Cognitive / Functional decline: Appears  A and O x 3 and  appropriate by phone. Reports self care, [place], driving.  5. Follow up Palliative Care Visit: Palliative care will continue to follow for goals of care clarification and symptom management. Return 3 weeks or prn.  I spent 45 minutes providing this consultation,  from 1200 to 1245. More than 50% of the time in this consultation was spent coordinating communication.   HISTORY OF PRESENT ILLNESS:  Colin Novak is a 73 y.o. year old male with multiple medical problems including lung cancer, anorexia, COPD, tobacco abuse . Palliative Care was asked to follow this patient by consultation request of Crissman, Jeannette How, MD to help address advance care planning and goals of care. This is the initial visit.  CODE STATUS: TBD, sending MOST to home  PPS: 50% HOSPICE ELIGIBILITY/DIAGNOSIS: TBD  PAST MEDICAL HISTORY:  Past Medical History:  Diagnosis Date  . Anxiety   . COPD (chronic obstructive pulmonary disease) ([place])   . Diabetes mellitus without complication ([place])    type 2  . GERD (gastroesophageal reflux disease)   . Hyperlipidemia     SOCIAL HX:  Social History   Tobacco Use  . Smoking status: Current Every Day Smoker    Packs/day: 1.00    Types: Cigarettes  . Smokeless tobacco: Former Systems developer    Types: Chew    Quit date: 06/10/1959  . Tobacco comment: 1 pack daily- 06/09/2019  Substance Use Topics  . Alcohol use: No    ALLERGIES:  Allergies  Allergen Reactions  . Penicillins Rash     PERTINENT MEDICATIONS:  Outpatient Encounter Medications as of 07/08/2019  Medication Sig  . albuterol (PROAIR HFA)  108 (90 Base) MCG/ACT inhaler Inhale 1-2 puffs into the lungs every 6 (six) hours as needed for wheezing or shortness of breath (use as needed for chest congestion.).  Marland Kitchen aspirin EC 81 MG tablet Take 81 mg by mouth daily.  . citalopram (CELEXA) 20 MG tablet Take 1 tablet (20 mg total) by mouth daily.  Marland Kitchen dexamethasone (DECADRON) 4 MG tablet Take 1 tab two times a day the day before Alimta  chemo, then take 2 tabs once a day for 3 days starting the day after chemo.  Marland Kitchen Dextromethorphan-guaiFENesin (ROBITUSSIN DM PO) Take 5 mLs by mouth at bedtime.   Marland Kitchen ezetimibe-simvastatin (VYTORIN) 10-40 MG tablet Take 1 tablet by mouth daily.  . Fluticasone-Umeclidin-Vilant (TRELEGY ELLIPTA) 100-62.5-25 MCG/INH AEPB Inhale 1 applicator into the lungs daily. Rinse mouth after use.  . folic acid (FOLVITE) 1 MG tablet Take 1 tablet (1 mg total) by mouth daily. Start 5-7 days before Alimta chemotherapy. Continue until 21 days after Alimta completed.  Marland Kitchen glucose blood (ONE TOUCH ULTRA TEST) test strip 1 each by Other route 2 (two) times daily as needed for other. Dx: E11.9  . HUMALOG KWIKPEN 100 UNIT/ML KwikPen INJECT UP TO 50 UNITS DAILY IN DIVIDED DOSES AS DIRECTED  . insulin aspart (NOVOLOG) 100 UNIT/ML injection Inject into the skin 3 (three) times daily before meals. Sliding scale  . LANTUS SOLOSTAR 100 UNIT/ML Solostar Pen INJECT 10-20 UNITS INTO THE SKIN DAILY (Patient taking differently: Inject 12 Units into the skin daily. )  . lidocaine-prilocaine (EMLA) cream Apply to affected area once  . metFORMIN (GLUCOPHAGE) 500 MG tablet TAKE ONE TABLET BY MOUTH TWICE DAILY (Patient taking differently: Take 1,000 mg by mouth 2 (two) times daily. )  . Multiple Vitamin (MULTIVITAMIN) tablet Take 1 tablet by mouth daily.  Marland Kitchen NOVOFINE 32G X 6 MM MISC USE AS DIRECTED. 5 INJECTIONS A DAY  . omeprazole (PRILOSEC) 20 MG capsule Take 1 capsule (20 mg total) by mouth daily.  . ondansetron (ZOFRAN) 8 MG tablet Take 1 tablet (8 mg total) by mouth 2 (two) times daily as needed (Nausea or vomiting). Start if needed on the third day after chemotherapy.  Marland Kitchen oxymetazoline (AFRIN) 0.05 % nasal spray Place 2 sprays into both nostrils 2 (two) times daily as needed.   . pioglitazone (ACTOS) 30 MG tablet Take 30 mg by mouth daily.   . prochlorperazine (COMPAZINE) 10 MG tablet Take 1 tablet (10 mg total) by mouth every 6 (six) hours  as needed (Nausea or vomiting).  . pseudoephedrine-acetaminophen (TYLENOL SINUS) 30-500 MG TABS tablet Take 2 tablets by mouth every 4 (four) hours as needed. Only using twice daily  . triamcinolone (NASACORT) 55 MCG/ACT AERO nasal inhaler Place 1 spray into the nose 2 (two) times daily. (Patient not taking: Reported on 06/10/2019)   No facility-administered encounter medications on file as of 07/08/2019.     PHYSICAL EXAM / ROS:   Current and past weights: TBD General: NAD, frail, thin Cardiovascular: no chest pain reported, no edema reported Pulmonary: no cough, no increased SOB, DOE, smoker Abdomen: appetite fair, endorses occ constipation, continent of bowel GU: denies dysuria, continent of urine MSK:  no joint deformities, ambulatory, states driving Skin: no rashes or wounds reported Neurological: Weakness, states sleeping well, no pain   Cyndia Skeeters DNP AGPCNP-BC

## 2019-07-11 ENCOUNTER — Other Ambulatory Visit: Payer: Self-pay

## 2019-07-11 ENCOUNTER — Ambulatory Visit
Admission: RE | Admit: 2019-07-11 | Discharge: 2019-07-11 | Disposition: A | Discharge status: Home / Self Care | Payer: Medicare Other | Source: Ambulatory Visit | Attending: Radiation Oncology | Admitting: Radiation Oncology

## 2019-07-11 DIAGNOSIS — C3411 Malignant neoplasm of upper lobe, right bronchus or lung: Secondary | ICD-10-CM | POA: Diagnosis not present

## 2019-07-11 DIAGNOSIS — C7802 Secondary malignant neoplasm of left lung: Secondary | ICD-10-CM | POA: Diagnosis not present

## 2019-07-11 DIAGNOSIS — Z510 Encounter for antineoplastic radiation therapy: Secondary | ICD-10-CM | POA: Diagnosis not present

## 2019-07-12 ENCOUNTER — Ambulatory Visit
Admission: RE | Admit: 2019-07-12 | Discharge: 2019-07-12 | Disposition: A | Discharge status: Home / Self Care | Payer: Medicare Other | Source: Ambulatory Visit | Attending: Radiation Oncology | Admitting: Radiation Oncology

## 2019-07-12 DIAGNOSIS — C7802 Secondary malignant neoplasm of left lung: Secondary | ICD-10-CM | POA: Diagnosis not present

## 2019-07-12 DIAGNOSIS — C3411 Malignant neoplasm of upper lobe, right bronchus or lung: Secondary | ICD-10-CM | POA: Diagnosis not present

## 2019-07-12 DIAGNOSIS — Z510 Encounter for antineoplastic radiation therapy: Secondary | ICD-10-CM | POA: Diagnosis not present

## 2019-07-13 ENCOUNTER — Other Ambulatory Visit: Payer: Self-pay

## 2019-07-13 ENCOUNTER — Ambulatory Visit
Admission: RE | Admit: 2019-07-13 | Discharge: 2019-07-13 | Disposition: A | Discharge status: Home / Self Care | Payer: Medicare Other | Source: Ambulatory Visit | Attending: Radiation Oncology | Admitting: Radiation Oncology

## 2019-07-13 DIAGNOSIS — C7802 Secondary malignant neoplasm of left lung: Secondary | ICD-10-CM | POA: Diagnosis not present

## 2019-07-13 DIAGNOSIS — C3411 Malignant neoplasm of upper lobe, right bronchus or lung: Secondary | ICD-10-CM | POA: Diagnosis not present

## 2019-07-13 DIAGNOSIS — Z510 Encounter for antineoplastic radiation therapy: Secondary | ICD-10-CM | POA: Diagnosis not present

## 2019-07-14 ENCOUNTER — Other Ambulatory Visit: Payer: Self-pay

## 2019-07-14 ENCOUNTER — Ambulatory Visit
Admission: RE | Admit: 2019-07-14 | Discharge: 2019-07-14 | Disposition: A | Discharge status: Home / Self Care | Payer: Medicare Other | Source: Ambulatory Visit | Attending: Radiation Oncology | Admitting: Radiation Oncology

## 2019-07-14 DIAGNOSIS — C7802 Secondary malignant neoplasm of left lung: Secondary | ICD-10-CM | POA: Diagnosis not present

## 2019-07-14 DIAGNOSIS — C3411 Malignant neoplasm of upper lobe, right bronchus or lung: Secondary | ICD-10-CM | POA: Diagnosis not present

## 2019-07-14 DIAGNOSIS — Z510 Encounter for antineoplastic radiation therapy: Secondary | ICD-10-CM | POA: Diagnosis not present

## 2019-07-15 ENCOUNTER — Ambulatory Visit
Admission: RE | Admit: 2019-07-15 | Discharge: 2019-07-15 | Disposition: A | Discharge status: Home / Self Care | Payer: Medicare Other | Source: Ambulatory Visit | Attending: Radiation Oncology | Admitting: Radiation Oncology

## 2019-07-15 ENCOUNTER — Other Ambulatory Visit: Payer: Self-pay

## 2019-07-15 DIAGNOSIS — Z510 Encounter for antineoplastic radiation therapy: Secondary | ICD-10-CM | POA: Diagnosis not present

## 2019-07-15 DIAGNOSIS — C3411 Malignant neoplasm of upper lobe, right bronchus or lung: Secondary | ICD-10-CM | POA: Diagnosis not present

## 2019-07-15 DIAGNOSIS — C7802 Secondary malignant neoplasm of left lung: Secondary | ICD-10-CM | POA: Diagnosis not present

## 2019-07-18 ENCOUNTER — Ambulatory Visit
Admission: RE | Admit: 2019-07-18 | Discharge: 2019-07-18 | Disposition: A | Discharge status: Home / Self Care | Payer: Medicare Other | Source: Ambulatory Visit | Attending: Radiation Oncology | Admitting: Radiation Oncology

## 2019-07-18 ENCOUNTER — Other Ambulatory Visit: Payer: Self-pay

## 2019-07-18 DIAGNOSIS — Z510 Encounter for antineoplastic radiation therapy: Secondary | ICD-10-CM | POA: Diagnosis not present

## 2019-07-18 DIAGNOSIS — C3411 Malignant neoplasm of upper lobe, right bronchus or lung: Secondary | ICD-10-CM | POA: Diagnosis not present

## 2019-07-18 DIAGNOSIS — C7802 Secondary malignant neoplasm of left lung: Secondary | ICD-10-CM | POA: Diagnosis not present

## 2019-07-19 ENCOUNTER — Other Ambulatory Visit: Payer: Self-pay

## 2019-07-19 ENCOUNTER — Ambulatory Visit
Admission: RE | Admit: 2019-07-19 | Discharge: 2019-07-19 | Disposition: A | Discharge status: Home / Self Care | Payer: Medicare Other | Source: Ambulatory Visit | Attending: Radiation Oncology | Admitting: Radiation Oncology

## 2019-07-19 DIAGNOSIS — Z510 Encounter for antineoplastic radiation therapy: Secondary | ICD-10-CM | POA: Diagnosis not present

## 2019-07-19 DIAGNOSIS — C7802 Secondary malignant neoplasm of left lung: Secondary | ICD-10-CM | POA: Diagnosis not present

## 2019-07-19 DIAGNOSIS — C3411 Malignant neoplasm of upper lobe, right bronchus or lung: Secondary | ICD-10-CM | POA: Diagnosis not present

## 2019-07-20 ENCOUNTER — Other Ambulatory Visit: Payer: Self-pay

## 2019-07-20 ENCOUNTER — Ambulatory Visit
Admission: RE | Admit: 2019-07-20 | Discharge: 2019-07-20 | Disposition: A | Discharge status: Home / Self Care | Payer: Medicare Other | Source: Ambulatory Visit | Attending: Radiation Oncology | Admitting: Radiation Oncology

## 2019-07-20 DIAGNOSIS — C7802 Secondary malignant neoplasm of left lung: Secondary | ICD-10-CM | POA: Diagnosis not present

## 2019-07-20 DIAGNOSIS — Z510 Encounter for antineoplastic radiation therapy: Secondary | ICD-10-CM | POA: Diagnosis not present

## 2019-07-20 DIAGNOSIS — C3411 Malignant neoplasm of upper lobe, right bronchus or lung: Secondary | ICD-10-CM | POA: Diagnosis not present

## 2019-07-21 ENCOUNTER — Ambulatory Visit
Admission: RE | Admit: 2019-07-21 | Discharge: 2019-07-21 | Disposition: A | Discharge status: Home / Self Care | Payer: Medicare Other | Source: Ambulatory Visit | Attending: Radiation Oncology | Admitting: Radiation Oncology

## 2019-07-21 ENCOUNTER — Inpatient Hospital Stay: Payer: Medicare Other

## 2019-07-21 ENCOUNTER — Other Ambulatory Visit: Payer: Self-pay

## 2019-07-21 DIAGNOSIS — Z510 Encounter for antineoplastic radiation therapy: Secondary | ICD-10-CM | POA: Diagnosis not present

## 2019-07-21 DIAGNOSIS — C3411 Malignant neoplasm of upper lobe, right bronchus or lung: Secondary | ICD-10-CM | POA: Diagnosis not present

## 2019-07-21 DIAGNOSIS — C3490 Malignant neoplasm of unspecified part of unspecified bronchus or lung: Secondary | ICD-10-CM | POA: Diagnosis not present

## 2019-07-21 LAB — CBC
HCT: 39.3 % (ref 39.0–52.0)
Hemoglobin: 13.6 g/dL (ref 13.0–17.0)
MCH: 34.2 pg — ABNORMAL HIGH (ref 26.0–34.0)
MCHC: 34.6 g/dL (ref 30.0–36.0)
MCV: 98.7 fL (ref 80.0–100.0)
Platelets: 217 10*3/uL (ref 150–400)
RBC: 3.98 MIL/uL — ABNORMAL LOW (ref 4.22–5.81)
RDW: 12.7 % (ref 11.5–15.5)
WBC: 5 10*3/uL (ref 4.0–10.5)
nRBC: 0 % (ref 0.0–0.2)

## 2019-07-22 ENCOUNTER — Ambulatory Visit
Admission: RE | Admit: 2019-07-22 | Discharge: 2019-07-22 | Disposition: A | Discharge status: Home / Self Care | Payer: Medicare Other | Source: Ambulatory Visit | Attending: Radiation Oncology | Admitting: Radiation Oncology

## 2019-07-22 ENCOUNTER — Other Ambulatory Visit: Payer: Self-pay

## 2019-07-22 DIAGNOSIS — C7802 Secondary malignant neoplasm of left lung: Secondary | ICD-10-CM | POA: Diagnosis not present

## 2019-07-22 DIAGNOSIS — Z510 Encounter for antineoplastic radiation therapy: Secondary | ICD-10-CM | POA: Diagnosis not present

## 2019-07-22 DIAGNOSIS — C3411 Malignant neoplasm of upper lobe, right bronchus or lung: Secondary | ICD-10-CM | POA: Diagnosis not present

## 2019-07-25 ENCOUNTER — Other Ambulatory Visit: Payer: Self-pay

## 2019-07-25 ENCOUNTER — Ambulatory Visit
Admission: RE | Admit: 2019-07-25 | Discharge: 2019-07-25 | Disposition: A | Discharge status: Home / Self Care | Payer: Medicare Other | Source: Ambulatory Visit | Attending: Radiation Oncology | Admitting: Radiation Oncology

## 2019-07-25 DIAGNOSIS — Z510 Encounter for antineoplastic radiation therapy: Secondary | ICD-10-CM | POA: Diagnosis not present

## 2019-07-25 DIAGNOSIS — C3411 Malignant neoplasm of upper lobe, right bronchus or lung: Secondary | ICD-10-CM | POA: Diagnosis not present

## 2019-07-25 DIAGNOSIS — C7802 Secondary malignant neoplasm of left lung: Secondary | ICD-10-CM | POA: Diagnosis not present

## 2019-07-26 ENCOUNTER — Other Ambulatory Visit: Payer: Self-pay

## 2019-07-26 ENCOUNTER — Ambulatory Visit
Admission: RE | Admit: 2019-07-26 | Discharge: 2019-07-26 | Disposition: A | Discharge status: Home / Self Care | Payer: Medicare Other | Source: Ambulatory Visit | Attending: Radiation Oncology | Admitting: Radiation Oncology

## 2019-07-26 DIAGNOSIS — Z510 Encounter for antineoplastic radiation therapy: Secondary | ICD-10-CM | POA: Diagnosis not present

## 2019-07-26 DIAGNOSIS — C7802 Secondary malignant neoplasm of left lung: Secondary | ICD-10-CM | POA: Diagnosis not present

## 2019-07-26 DIAGNOSIS — E1165 Type 2 diabetes mellitus with hyperglycemia: Secondary | ICD-10-CM | POA: Diagnosis not present

## 2019-07-26 DIAGNOSIS — C3411 Malignant neoplasm of upper lobe, right bronchus or lung: Secondary | ICD-10-CM | POA: Diagnosis not present

## 2019-07-26 DIAGNOSIS — C3490 Malignant neoplasm of unspecified part of unspecified bronchus or lung: Secondary | ICD-10-CM | POA: Diagnosis not present

## 2019-07-26 DIAGNOSIS — F17200 Nicotine dependence, unspecified, uncomplicated: Secondary | ICD-10-CM | POA: Diagnosis not present

## 2019-07-27 ENCOUNTER — Ambulatory Visit
Admission: RE | Admit: 2019-07-27 | Discharge: 2019-07-27 | Disposition: A | Discharge status: Home / Self Care | Payer: Medicare Other | Source: Ambulatory Visit | Attending: Radiation Oncology | Admitting: Radiation Oncology

## 2019-07-27 ENCOUNTER — Other Ambulatory Visit: Payer: Self-pay

## 2019-07-27 DIAGNOSIS — C7802 Secondary malignant neoplasm of left lung: Secondary | ICD-10-CM | POA: Diagnosis not present

## 2019-07-27 DIAGNOSIS — C3411 Malignant neoplasm of upper lobe, right bronchus or lung: Secondary | ICD-10-CM | POA: Diagnosis not present

## 2019-07-27 DIAGNOSIS — Z510 Encounter for antineoplastic radiation therapy: Secondary | ICD-10-CM | POA: Diagnosis not present

## 2019-07-27 LAB — ACID FAST CULTURE WITH REFLEXED SENSITIVITIES (MYCOBACTERIA): Acid Fast Culture: NEGATIVE

## 2019-07-28 ENCOUNTER — Other Ambulatory Visit: Payer: Self-pay

## 2019-07-28 ENCOUNTER — Ambulatory Visit
Admission: RE | Admit: 2019-07-28 | Discharge: 2019-07-28 | Disposition: A | Discharge status: Home / Self Care | Payer: Medicare Other | Source: Ambulatory Visit | Attending: Radiation Oncology | Admitting: Radiation Oncology

## 2019-07-28 ENCOUNTER — Inpatient Hospital Stay: Payer: Medicare Other | Attending: Radiation Oncology

## 2019-07-28 DIAGNOSIS — C3411 Malignant neoplasm of upper lobe, right bronchus or lung: Secondary | ICD-10-CM | POA: Insufficient documentation

## 2019-07-28 DIAGNOSIS — Z510 Encounter for antineoplastic radiation therapy: Secondary | ICD-10-CM | POA: Diagnosis not present

## 2019-07-29 ENCOUNTER — Other Ambulatory Visit: Payer: Self-pay

## 2019-07-29 ENCOUNTER — Ambulatory Visit
Admission: RE | Admit: 2019-07-29 | Discharge: 2019-07-29 | Disposition: A | Discharge status: Home / Self Care | Payer: Medicare Other | Source: Ambulatory Visit | Attending: Radiation Oncology | Admitting: Radiation Oncology

## 2019-07-29 DIAGNOSIS — C3411 Malignant neoplasm of upper lobe, right bronchus or lung: Secondary | ICD-10-CM | POA: Diagnosis not present

## 2019-07-29 DIAGNOSIS — C7802 Secondary malignant neoplasm of left lung: Secondary | ICD-10-CM | POA: Diagnosis not present

## 2019-07-29 DIAGNOSIS — Z510 Encounter for antineoplastic radiation therapy: Secondary | ICD-10-CM | POA: Diagnosis not present

## 2019-08-01 ENCOUNTER — Ambulatory Visit
Admission: RE | Admit: 2019-08-01 | Discharge: 2019-08-01 | Disposition: A | Discharge status: Home / Self Care | Payer: Medicare Other | Source: Ambulatory Visit | Attending: Radiation Oncology | Admitting: Radiation Oncology

## 2019-08-01 ENCOUNTER — Other Ambulatory Visit: Payer: Self-pay

## 2019-08-01 DIAGNOSIS — C7802 Secondary malignant neoplasm of left lung: Secondary | ICD-10-CM | POA: Diagnosis not present

## 2019-08-01 DIAGNOSIS — Z510 Encounter for antineoplastic radiation therapy: Secondary | ICD-10-CM | POA: Diagnosis not present

## 2019-08-01 DIAGNOSIS — C3411 Malignant neoplasm of upper lobe, right bronchus or lung: Secondary | ICD-10-CM | POA: Diagnosis not present

## 2019-08-02 ENCOUNTER — Ambulatory Visit
Admission: RE | Admit: 2019-08-02 | Discharge: 2019-08-02 | Disposition: A | Discharge status: Home / Self Care | Payer: Medicare Other | Source: Ambulatory Visit | Attending: Radiation Oncology | Admitting: Radiation Oncology

## 2019-08-02 ENCOUNTER — Other Ambulatory Visit: Payer: Self-pay

## 2019-08-02 DIAGNOSIS — C3411 Malignant neoplasm of upper lobe, right bronchus or lung: Secondary | ICD-10-CM | POA: Diagnosis not present

## 2019-08-02 DIAGNOSIS — C7802 Secondary malignant neoplasm of left lung: Secondary | ICD-10-CM | POA: Diagnosis not present

## 2019-08-02 DIAGNOSIS — Z510 Encounter for antineoplastic radiation therapy: Secondary | ICD-10-CM | POA: Diagnosis not present

## 2019-08-03 ENCOUNTER — Other Ambulatory Visit: Payer: Self-pay

## 2019-08-03 ENCOUNTER — Ambulatory Visit
Admission: RE | Admit: 2019-08-03 | Discharge: 2019-08-03 | Disposition: A | Discharge status: Home / Self Care | Payer: Medicare Other | Source: Ambulatory Visit | Attending: Radiation Oncology | Admitting: Radiation Oncology

## 2019-08-03 DIAGNOSIS — C3411 Malignant neoplasm of upper lobe, right bronchus or lung: Secondary | ICD-10-CM | POA: Diagnosis not present

## 2019-08-03 DIAGNOSIS — Z510 Encounter for antineoplastic radiation therapy: Secondary | ICD-10-CM | POA: Diagnosis not present

## 2019-08-03 DIAGNOSIS — C7802 Secondary malignant neoplasm of left lung: Secondary | ICD-10-CM | POA: Diagnosis not present

## 2019-08-04 ENCOUNTER — Other Ambulatory Visit: Payer: Self-pay

## 2019-08-04 ENCOUNTER — Ambulatory Visit
Admission: RE | Admit: 2019-08-04 | Discharge: 2019-08-04 | Disposition: A | Discharge status: Home / Self Care | Payer: Medicare Other | Source: Ambulatory Visit | Attending: Radiation Oncology | Admitting: Radiation Oncology

## 2019-08-04 DIAGNOSIS — Z510 Encounter for antineoplastic radiation therapy: Secondary | ICD-10-CM | POA: Diagnosis not present

## 2019-08-04 DIAGNOSIS — C3411 Malignant neoplasm of upper lobe, right bronchus or lung: Secondary | ICD-10-CM | POA: Diagnosis not present

## 2019-08-05 ENCOUNTER — Other Ambulatory Visit: Payer: Self-pay

## 2019-08-05 ENCOUNTER — Ambulatory Visit
Admission: RE | Admit: 2019-08-05 | Discharge: 2019-08-05 | Disposition: A | Discharge status: Home / Self Care | Payer: Medicare Other | Source: Ambulatory Visit | Attending: Radiation Oncology | Admitting: Radiation Oncology

## 2019-08-05 DIAGNOSIS — C7802 Secondary malignant neoplasm of left lung: Secondary | ICD-10-CM | POA: Diagnosis not present

## 2019-08-05 DIAGNOSIS — Z510 Encounter for antineoplastic radiation therapy: Secondary | ICD-10-CM | POA: Diagnosis not present

## 2019-08-05 DIAGNOSIS — C3411 Malignant neoplasm of upper lobe, right bronchus or lung: Secondary | ICD-10-CM | POA: Diagnosis not present

## 2019-08-11 ENCOUNTER — Other Ambulatory Visit: Payer: Self-pay

## 2019-08-12 ENCOUNTER — Ambulatory Visit
Admission: RE | Admit: 2019-08-12 | Discharge: 2019-08-12 | Disposition: A | Discharge status: Home / Self Care | Payer: Medicare Other | Source: Ambulatory Visit | Attending: Radiation Oncology | Admitting: Radiation Oncology

## 2019-08-12 ENCOUNTER — Encounter: Payer: Self-pay | Admitting: Radiation Oncology

## 2019-08-12 ENCOUNTER — Other Ambulatory Visit: Payer: Self-pay

## 2019-08-12 VITALS — BP 116/77 | HR 95 | Temp 95.8°F | Resp 18 | Wt 118.2 lb

## 2019-08-12 DIAGNOSIS — C3411 Malignant neoplasm of upper lobe, right bronchus or lung: Secondary | ICD-10-CM | POA: Diagnosis not present

## 2019-08-12 DIAGNOSIS — Z9221 Personal history of antineoplastic chemotherapy: Secondary | ICD-10-CM | POA: Insufficient documentation

## 2019-08-12 DIAGNOSIS — C3490 Malignant neoplasm of unspecified part of unspecified bronchus or lung: Secondary | ICD-10-CM

## 2019-08-12 NOTE — Progress Notes (Signed)
Radiation Oncology Follow up Note  Name: Colin Novak   Date:   08/12/2019 MRN:  [id number] DOB: 07-23-1946    This 72 y.o. male presents to the clinic today for 1 week follow-ups status post initial course of radiation therapy for early stage III non-small cell lung cancer of the right upper lobe.  REFERRING PROVIDER: Guadalupe Maple, MD  HPI: Patient is a 73 year old male who just completed 4000 cGy over 4 weeks to his right upper lobe for the stage III non-small cell lung cancer with concurrent chemotherapy.  He is seen today in follow-up and is doing well specifically denies cough hemoptysis dysphagia.  We are contemplating him for a small field boost..  Patient also has a possible second primary in the left lung field.  COMPLICATIONS OF TREATMENT: none  FOLLOW UP COMPLIANCE: keeps appointments   PHYSICAL EXAM:  BP 116/77   Pulse 95   Temp (!) 95.8 F (35.4 C)   Resp 18   Wt 118 lb 3.2 oz (53.6 kg)   BMI 17.46 kg/m  Well-developed well-nourished patient in NAD. HEENT reveals PERLA, EOMI, discs not visualized.  Oral cavity is clear. No oral mucosal lesions are identified. Neck is clear without evidence of cervical or supraclavicular adenopathy. Lungs are clear to A&P. Cardiac examination is essentially unremarkable with regular rate and rhythm without murmur rub or thrill. Abdomen is benign with no organomegaly or masses noted. Motor sensory and DTR levels are equal and symmetric in the upper and lower extremities. Cranial nerves II through XII are grossly intact. Proprioception is intact. No peripheral adenopathy or edema is identified. No motor or sensory levels are noted. Crude visual fields are within normal range.  RADIOLOGY RESULTS: No current films to review  PLAN: At this time like to resimulate the patient for possible small field boost.  Should we see nice response to therapy we will treat with another 3000 cGy over 3 weeks.  Risks and benefits of additional radiation  therapy were again reviewed with the patient.  We will review his CT scan findings at the time of simulation with the patient.  Patient comprehends my treatment plan well.  We will coordinate concurrent chemotherapy.  I would like to take this opportunity to thank you for allowing me to participate in the care of your patient.Noreene Filbert, MD

## 2019-08-16 ENCOUNTER — Telehealth: Payer: Self-pay | Admitting: Primary Care

## 2019-08-16 ENCOUNTER — Ambulatory Visit
Admission: RE | Admit: 2019-08-16 | Discharge: 2019-08-16 | Disposition: A | Discharge status: Home / Self Care | Payer: Medicare Other | Source: Ambulatory Visit | Attending: Radiation Oncology | Admitting: Radiation Oncology

## 2019-08-16 ENCOUNTER — Other Ambulatory Visit: Payer: Self-pay

## 2019-08-16 DIAGNOSIS — C3411 Malignant neoplasm of upper lobe, right bronchus or lung: Secondary | ICD-10-CM | POA: Diagnosis not present

## 2019-08-16 DIAGNOSIS — Z510 Encounter for antineoplastic radiation therapy: Secondary | ICD-10-CM | POA: Diagnosis not present

## 2019-08-16 DIAGNOSIS — C7802 Secondary malignant neoplasm of left lung: Secondary | ICD-10-CM | POA: Diagnosis not present

## 2019-08-16 NOTE — Telephone Encounter (Signed)
Daughter called to ask about bill and visit which was by telemedicine on 07/08/19.Telemedicine was explained to her but she was angry that the visit was not in person, and did not understand the use of MOST form vs living will. She will talk to oncology provider to determine if she'd like to continue palliative but at this writing, she did not.

## 2019-08-17 ENCOUNTER — Telehealth: Payer: Self-pay | Admitting: Hospice and Palliative Medicine

## 2019-08-17 DIAGNOSIS — C7802 Secondary malignant neoplasm of left lung: Secondary | ICD-10-CM | POA: Diagnosis not present

## 2019-08-17 DIAGNOSIS — Z510 Encounter for antineoplastic radiation therapy: Secondary | ICD-10-CM | POA: Diagnosis not present

## 2019-08-17 DIAGNOSIS — C3411 Malignant neoplasm of upper lobe, right bronchus or lung: Secondary | ICD-10-CM | POA: Diagnosis not present

## 2019-08-17 NOTE — Telephone Encounter (Signed)
I received a call from patient's daughter.  She verbalized some concern after receiving a bill from home based palliative care.  I relayed concerns to palliative care medical director, Clifton James, who agreed to reach out to daughter.  We will plan a follow-up visit with patient in 1 to 2 weeks when he is here for radiation.  I discussed advance care planning with patient's daughter.  She says that they have completed a living will.  Patient has verbalized a desire not to be resuscitated nor have his life prolonged artificially machines.  Daughter says that he wants to be a DO NOT RESUSCITATE.  We will plan to send patient home with a DNR order at time of next clinic visit.

## 2019-08-19 ENCOUNTER — Other Ambulatory Visit: Payer: Self-pay | Admitting: *Deleted

## 2019-08-23 ENCOUNTER — Ambulatory Visit
Admission: RE | Admit: 2019-08-23 | Discharge: 2019-08-23 | Disposition: A | Discharge status: Home / Self Care | Payer: Medicare Other | Source: Ambulatory Visit | Attending: Radiation Oncology | Admitting: Radiation Oncology

## 2019-08-23 ENCOUNTER — Other Ambulatory Visit: Payer: Self-pay

## 2019-08-23 DIAGNOSIS — C3411 Malignant neoplasm of upper lobe, right bronchus or lung: Secondary | ICD-10-CM | POA: Diagnosis not present

## 2019-08-23 DIAGNOSIS — Z510 Encounter for antineoplastic radiation therapy: Secondary | ICD-10-CM | POA: Diagnosis not present

## 2019-08-23 DIAGNOSIS — C7802 Secondary malignant neoplasm of left lung: Secondary | ICD-10-CM | POA: Diagnosis not present

## 2019-08-24 ENCOUNTER — Other Ambulatory Visit: Payer: Self-pay

## 2019-08-24 ENCOUNTER — Ambulatory Visit
Admission: RE | Admit: 2019-08-24 | Discharge: 2019-08-24 | Disposition: A | Discharge status: Home / Self Care | Payer: Medicare Other | Source: Ambulatory Visit | Attending: Radiation Oncology | Admitting: Radiation Oncology

## 2019-08-24 DIAGNOSIS — Z510 Encounter for antineoplastic radiation therapy: Secondary | ICD-10-CM | POA: Diagnosis not present

## 2019-08-24 DIAGNOSIS — C7802 Secondary malignant neoplasm of left lung: Secondary | ICD-10-CM | POA: Diagnosis not present

## 2019-08-24 DIAGNOSIS — C3411 Malignant neoplasm of upper lobe, right bronchus or lung: Secondary | ICD-10-CM | POA: Diagnosis not present

## 2019-08-25 ENCOUNTER — Other Ambulatory Visit: Payer: Self-pay

## 2019-08-25 ENCOUNTER — Ambulatory Visit
Admission: RE | Admit: 2019-08-25 | Discharge: 2019-08-25 | Disposition: A | Discharge status: Home / Self Care | Payer: Medicare Other | Source: Ambulatory Visit | Attending: Radiation Oncology | Admitting: Radiation Oncology

## 2019-08-25 DIAGNOSIS — C7802 Secondary malignant neoplasm of left lung: Secondary | ICD-10-CM | POA: Diagnosis not present

## 2019-08-25 DIAGNOSIS — C3411 Malignant neoplasm of upper lobe, right bronchus or lung: Secondary | ICD-10-CM | POA: Diagnosis not present

## 2019-08-25 DIAGNOSIS — Z510 Encounter for antineoplastic radiation therapy: Secondary | ICD-10-CM | POA: Diagnosis not present

## 2019-08-26 ENCOUNTER — Other Ambulatory Visit: Payer: Self-pay

## 2019-08-26 ENCOUNTER — Ambulatory Visit
Admission: RE | Admit: 2019-08-26 | Discharge: 2019-08-26 | Disposition: A | Discharge status: Home / Self Care | Payer: Medicare Other | Source: Ambulatory Visit | Attending: Radiation Oncology | Admitting: Radiation Oncology

## 2019-08-26 DIAGNOSIS — Z510 Encounter for antineoplastic radiation therapy: Secondary | ICD-10-CM | POA: Diagnosis not present

## 2019-08-26 DIAGNOSIS — C3411 Malignant neoplasm of upper lobe, right bronchus or lung: Secondary | ICD-10-CM | POA: Diagnosis not present

## 2019-08-26 DIAGNOSIS — C7802 Secondary malignant neoplasm of left lung: Secondary | ICD-10-CM | POA: Diagnosis not present

## 2019-08-29 ENCOUNTER — Other Ambulatory Visit: Payer: Self-pay

## 2019-08-29 ENCOUNTER — Ambulatory Visit
Admission: RE | Admit: 2019-08-29 | Discharge: 2019-08-29 | Disposition: A | Discharge status: Home / Self Care | Payer: Medicare Other | Source: Ambulatory Visit | Attending: Radiation Oncology | Admitting: Radiation Oncology

## 2019-08-29 DIAGNOSIS — C3411 Malignant neoplasm of upper lobe, right bronchus or lung: Secondary | ICD-10-CM | POA: Insufficient documentation

## 2019-08-29 DIAGNOSIS — Z510 Encounter for antineoplastic radiation therapy: Secondary | ICD-10-CM | POA: Insufficient documentation

## 2019-08-29 DIAGNOSIS — C7802 Secondary malignant neoplasm of left lung: Secondary | ICD-10-CM | POA: Diagnosis not present

## 2019-08-30 ENCOUNTER — Other Ambulatory Visit: Payer: Self-pay

## 2019-08-30 ENCOUNTER — Ambulatory Visit
Admission: RE | Admit: 2019-08-30 | Discharge: 2019-08-30 | Disposition: A | Discharge status: Home / Self Care | Payer: Medicare Other | Source: Ambulatory Visit | Attending: Radiation Oncology | Admitting: Radiation Oncology

## 2019-08-30 DIAGNOSIS — Z510 Encounter for antineoplastic radiation therapy: Secondary | ICD-10-CM | POA: Diagnosis not present

## 2019-08-30 DIAGNOSIS — C7802 Secondary malignant neoplasm of left lung: Secondary | ICD-10-CM | POA: Diagnosis not present

## 2019-08-30 DIAGNOSIS — C3411 Malignant neoplasm of upper lobe, right bronchus or lung: Secondary | ICD-10-CM | POA: Diagnosis not present

## 2019-08-31 ENCOUNTER — Ambulatory Visit
Admission: RE | Admit: 2019-08-31 | Discharge: 2019-08-31 | Disposition: A | Discharge status: Home / Self Care | Payer: Medicare Other | Source: Ambulatory Visit | Attending: Radiation Oncology | Admitting: Radiation Oncology

## 2019-08-31 ENCOUNTER — Other Ambulatory Visit: Payer: Self-pay

## 2019-08-31 DIAGNOSIS — Z510 Encounter for antineoplastic radiation therapy: Secondary | ICD-10-CM | POA: Diagnosis not present

## 2019-08-31 DIAGNOSIS — C3411 Malignant neoplasm of upper lobe, right bronchus or lung: Secondary | ICD-10-CM | POA: Diagnosis not present

## 2019-08-31 DIAGNOSIS — C7802 Secondary malignant neoplasm of left lung: Secondary | ICD-10-CM | POA: Diagnosis not present

## 2019-09-01 ENCOUNTER — Other Ambulatory Visit: Payer: Self-pay | Admitting: *Deleted

## 2019-09-01 ENCOUNTER — Encounter: Payer: Self-pay | Admitting: Hospice and Palliative Medicine

## 2019-09-01 ENCOUNTER — Ambulatory Visit
Admission: RE | Admit: 2019-09-01 | Discharge: 2019-09-01 | Disposition: A | Discharge status: Home / Self Care | Payer: Medicare Other | Source: Ambulatory Visit | Attending: Radiation Oncology | Admitting: Radiation Oncology

## 2019-09-01 ENCOUNTER — Inpatient Hospital Stay: Payer: Medicare Other

## 2019-09-01 ENCOUNTER — Other Ambulatory Visit: Payer: Self-pay

## 2019-09-01 ENCOUNTER — Inpatient Hospital Stay: Payer: Medicare Other | Attending: Hospice and Palliative Medicine | Admitting: Hospice and Palliative Medicine

## 2019-09-01 VITALS — BP 102/69 | HR 104 | Temp 97.9°F | Resp 20 | Wt 116.0 lb

## 2019-09-01 DIAGNOSIS — Z79899 Other long term (current) drug therapy: Secondary | ICD-10-CM | POA: Insufficient documentation

## 2019-09-01 DIAGNOSIS — E119 Type 2 diabetes mellitus without complications: Secondary | ICD-10-CM | POA: Insufficient documentation

## 2019-09-01 DIAGNOSIS — Z66 Do not resuscitate: Secondary | ICD-10-CM | POA: Insufficient documentation

## 2019-09-01 DIAGNOSIS — C7802 Secondary malignant neoplasm of left lung: Secondary | ICD-10-CM | POA: Diagnosis not present

## 2019-09-01 DIAGNOSIS — E785 Hyperlipidemia, unspecified: Secondary | ICD-10-CM | POA: Diagnosis not present

## 2019-09-01 DIAGNOSIS — C3490 Malignant neoplasm of unspecified part of unspecified bronchus or lung: Secondary | ICD-10-CM

## 2019-09-01 DIAGNOSIS — C3411 Malignant neoplasm of upper lobe, right bronchus or lung: Secondary | ICD-10-CM | POA: Diagnosis not present

## 2019-09-01 DIAGNOSIS — Z7189 Other specified counseling: Secondary | ICD-10-CM | POA: Diagnosis not present

## 2019-09-01 DIAGNOSIS — K219 Gastro-esophageal reflux disease without esophagitis: Secondary | ICD-10-CM | POA: Diagnosis not present

## 2019-09-01 DIAGNOSIS — Z794 Long term (current) use of insulin: Secondary | ICD-10-CM | POA: Diagnosis not present

## 2019-09-01 DIAGNOSIS — F419 Anxiety disorder, unspecified: Secondary | ICD-10-CM | POA: Insufficient documentation

## 2019-09-01 DIAGNOSIS — Z510 Encounter for antineoplastic radiation therapy: Secondary | ICD-10-CM | POA: Diagnosis not present

## 2019-09-01 DIAGNOSIS — J449 Chronic obstructive pulmonary disease, unspecified: Secondary | ICD-10-CM | POA: Insufficient documentation

## 2019-09-01 DIAGNOSIS — F17210 Nicotine dependence, cigarettes, uncomplicated: Secondary | ICD-10-CM | POA: Insufficient documentation

## 2019-09-01 DIAGNOSIS — Z7982 Long term (current) use of aspirin: Secondary | ICD-10-CM | POA: Diagnosis not present

## 2019-09-01 DIAGNOSIS — Z515 Encounter for palliative care: Secondary | ICD-10-CM

## 2019-09-01 DIAGNOSIS — R197 Diarrhea, unspecified: Secondary | ICD-10-CM

## 2019-09-01 LAB — CBC
HCT: 41 % (ref 39.0–52.0)
Hemoglobin: 14.2 g/dL (ref 13.0–17.0)
MCH: 33.7 pg (ref 26.0–34.0)
MCHC: 34.6 g/dL (ref 30.0–36.0)
MCV: 97.4 fL (ref 80.0–100.0)
Platelets: 220 10*3/uL (ref 150–400)
RBC: 4.21 MIL/uL — ABNORMAL LOW (ref 4.22–5.81)
RDW: 13.1 % (ref 11.5–15.5)
WBC: 5.7 10*3/uL (ref 4.0–10.5)
nRBC: 0 % (ref 0.0–0.2)

## 2019-09-01 NOTE — Progress Notes (Signed)
[facility]  Telephone:([phone] [phone]   Name: Colin Novak Date: 09/01/2019 MRN: [id number]  DOB: 1946/01/19  Patient Care Team: Guadalupe Maple, MD as PCP - General (Family Medicine) Gabriel Carina, Betsey Holiday, MD as Physician Assistant (Endocrinology) Kem Parkinson, MD (Ophthalmology) Telford Nab, RN as Registered Nurse Tamala Julian, Jonette Eva, NP as Nurse Practitioner    REASON FOR CONSULTATION: Palliative Care consult requested for this 73 y.o. male with multiple medical problems including stage III-IV non-small cell lung cancer who was planned to start systemic chemotherapy plus/minus immunotherapy with possible palliative radiation to the right upper lobe as mass is obstructing his bronchus.  Patient was offered chemotherapy but declined. He did decide to pursue RT.  There is been discussion of hospice involvement once RT is completed.  Patient was referred to palliative care to help address goals and manage ongoing symptoms..    SOCIAL HISTORY:     reports that he has been smoking cigarettes. He has been smoking about 1.00 pack per day. He quit smokeless tobacco use about 60 years ago.  His smokeless tobacco use included chew. He reports that he does not drink alcohol or use drugs.   Patient is a widower.  He lives at home alone with his dog.  He has a daughter who is involved in his care.  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  ADVANCE DIRECTIVES:  Not on file  CODE STATUS: DNR (DNR form signed on 09/01/2019)  PAST MEDICAL HISTORY: Past Medical History:  Diagnosis Date  . Anxiety   . COPD (chronic obstructive pulmonary disease) ([place])   . Diabetes mellitus without complication ([place])    type 2  . GERD (gastroesophageal reflux disease)   . Hyperlipidemia     PAST SURGICAL HISTORY:  Past Surgical History:  Procedure Laterality Date  . APPENDECTOMY    . ENDOBRONCHIAL ULTRASOUND Bilateral  06/13/2019   Procedure: ENDOBRONCHIAL ULTRASOUND, BILATERAL;  Surgeon: [name], MD;  Location: [facility] ORS;  Service: Pulmonary;  Laterality: Bilateral;  . EYE SURGERY Bilateral    cataract    HEMATOLOGY/ONCOLOGY HISTORY:  Oncology History  Non-small cell lung cancer ([place])  06/20/2019 Initial Diagnosis   Non-small cell lung cancer ([place])   06/27/2019 -  Chemotherapy   The patient had PALONOSETRON HCL INJECTION 0.25 MG/5ML, 0.25 mg, Intravenous,  Once, 0 of 4 cycles PEMEtrexed (ALIMTA) 775 mg in sodium chloride 0.9 % 100 mL chemo infusion, 500 mg/m2, Intravenous,  Once, 0 of 4 cycles CARBOplatin (PARAPLATIN) in sodium chloride 0.9 % 100 mL chemo infusion, , Intravenous,  Once, 0 of 4 cycles FOSAPREPITANT 150MG  + DEXAMETHASONE INFUSION [facility], , Intravenous,  Once, 0 of 4 cycles  for chemotherapy treatment.      ALLERGIES:  is allergic to penicillins.  MEDICATIONS:  Current Outpatient Medications  Medication Sig Dispense Refill  . albuterol (PROAIR HFA) 108 (90 Base) MCG/ACT inhaler Inhale 1-2 puffs into the lungs every 6 (six) hours as needed for wheezing or shortness of breath (use as needed for chest congestion.). 1 g 3  . aspirin EC 81 MG tablet Take 81 mg by mouth daily.    . citalopram (CELEXA) 20 MG tablet Take 1 tablet (20 mg total) by mouth daily. 90 tablet 4  . dexamethasone (DECADRON) 4 MG tablet Take 1 tab two times a day the day before Alimta chemo, then take 2 tabs once a day for 3 days starting the day after  chemo. (Patient not taking: Reported on 07/08/2019) 30 tablet 1  . Dextromethorphan-guaiFENesin (ROBITUSSIN DM PO) Take 5 mLs by mouth at bedtime.     Marland Kitchen ezetimibe-simvastatin (VYTORIN) 10-40 MG tablet Take 1 tablet by mouth daily. 90 tablet 4  . Fluticasone-Umeclidin-Vilant (TRELEGY ELLIPTA) 100-62.5-25 MCG/INH AEPB Inhale 1 applicator into the lungs daily. Rinse mouth after use. 1 each 10  . folic acid (FOLVITE) 1 MG tablet Take 1 tablet (1 mg total) by mouth  daily. Start 5-7 days before Alimta chemotherapy. Continue until 21 days after Alimta completed. 100 tablet 3  . glucose blood (ONE TOUCH ULTRA TEST) test strip 1 each by Other route 2 (two) times daily as needed for other. Dx: E11.9 100 each 12  . HUMALOG KWIKPEN 100 UNIT/ML KwikPen 12 Units. Sliding scale up to 12 units    . insulin aspart (NOVOLOG) 100 UNIT/ML injection Inject into the skin 3 (three) times daily before meals. Sliding scale    . LANTUS SOLOSTAR 100 UNIT/ML Solostar Pen INJECT 10-20 UNITS INTO THE SKIN DAILY (Patient taking differently: Inject 14 Units into the skin daily. ) 5 pen 12  . lidocaine-prilocaine (EMLA) cream Apply to affected area once 30 g 3  . metFORMIN (GLUCOPHAGE) 500 MG tablet TAKE ONE TABLET BY MOUTH TWICE DAILY (Patient taking differently: Take 1,000 mg by mouth 2 (two) times daily. ) 60 tablet 12  . Multiple Vitamin (MULTIVITAMIN) tablet Take 1 tablet by mouth daily.    Marland Kitchen NOVOFINE 32G X 6 MM MISC USE AS DIRECTED. 5 INJECTIONS A DAY 900 each 0  . omeprazole (PRILOSEC) 20 MG capsule Take 1 capsule (20 mg total) by mouth daily. 30 capsule 11  . ondansetron (ZOFRAN) 8 MG tablet Take 1 tablet (8 mg total) by mouth 2 (two) times daily as needed (Nausea or vomiting). Start if needed on the third day after chemotherapy. (Patient not taking: Reported on 07/08/2019) 30 tablet 1  . oxymetazoline (AFRIN) 0.05 % nasal spray Place 2 sprays into both nostrils 2 (two) times daily as needed.     . pioglitazone (ACTOS) 30 MG tablet Take 30 mg by mouth daily.   1  . predniSONE (DELTASONE) 20 MG tablet Take 20 mg by mouth daily with breakfast.    . prochlorperazine (COMPAZINE) 10 MG tablet Take 1 tablet (10 mg total) by mouth every 6 (six) hours as needed (Nausea or vomiting). 30 tablet 1  . pseudoephedrine-acetaminophen (TYLENOL SINUS) 30-500 MG TABS tablet Take 2 tablets by mouth every 4 (four) hours as needed. Only using twice daily    . triamcinolone (NASACORT) 55 MCG/ACT AERO  nasal inhaler Place 1 spray into the nose 2 (two) times daily. (Patient not taking: Reported on 06/10/2019) 1 Inhaler 2   No current facility-administered medications for this visit.     VITAL SIGNS: There were no vitals taken for this visit. There were no vitals filed for this visit.  Estimated body mass index is 17.46 kg/m as calculated from the following:   Height as of 06/13/19: 5\' 9"  (1.753 m).   Weight as of 08/12/19: 118 lb 3.2 oz (53.6 kg).  LABS: CBC:    Component Value Date/Time   WBC 5.7 09/01/2019 1322   HGB 14.2 09/01/2019 1322   HGB 13.2 12/06/2018 0857   HCT 41.0 09/01/2019 1322   HCT 38.6 12/06/2018 0857   PLT 220 09/01/2019 1322   PLT 266 12/06/2018 0857   MCV 97.4 09/01/2019 1322   MCV 96 12/06/2018 0857   NEUTROABS  3.9 06/07/2019 1157   NEUTROABS 1.7 12/06/2018 0857   LYMPHSABS 1.6 06/07/2019 1157   LYMPHSABS 1.6 12/06/2018 0857   MONOABS 0.6 06/07/2019 1157   EOSABS 0.0 06/07/2019 1157   EOSABS 0.1 12/06/2018 0857   BASOSABS 0.0 06/07/2019 1157   BASOSABS 0.1 12/06/2018 0857   Comprehensive Metabolic Panel:    Component Value Date/Time   NA 132 (L) 06/07/2019 1157   NA 140 12/06/2018 0857   K 4.6 06/07/2019 1157   CL 95 (L) 06/07/2019 1157   CO2 27 06/07/2019 1157   BUN 15 06/07/2019 1157   BUN 9 12/06/2018 0857   CREATININE 0.73 06/07/2019 1157   GLUCOSE 416 (H) 06/07/2019 1157   CALCIUM 9.3 06/07/2019 1157   AST 26 06/07/2019 1157   AST 55 (H) 04/01/2017 0907   ALT 37 06/07/2019 1157   ALT 48 (H) 04/01/2017 0907   ALKPHOS 77 06/07/2019 1157   BILITOT 0.7 06/07/2019 1157   BILITOT <0.2 12/06/2018 0857   PROT 7.3 06/07/2019 1157   PROT 6.4 12/06/2018 0857   ALBUMIN 4.3 06/07/2019 1157   ALBUMIN 4.1 12/06/2018 0857    RADIOGRAPHIC STUDIES: No results found.  PERFORMANCE STATUS (ECOG) : 1 - Symptomatic but completely ambulatory  Review of Systems Unless otherwise noted, a complete review of systems is negative.  Physical Exam  General: NAD, frail appearing, thin Cardiovascular: regular rate and rhythm Pulmonary: Coarse anterior posterior fields, nonlabored Abdomen: soft, nontender, + bowel sounds GU: no suprapubic tenderness Extremities: no edema Skin: no rashes Neurological: Weakness but otherwise nonfocal  IMPRESSION: Patient seen for routine follow-up in the clinic today.  Patient's daughter participated in our visit via phone.  Patient was referred to community palliative care in anticipation of future need for hospice.  However, patient was not in agreement with the co-pay required for community palliative care visits.  Patient feels he is doing reasonably well.  He reports no pain.  He has some shortness of breath exertionally, which is chronic for him and easily resolves with rest.  He lives at home alone and is functionally independent with his own care.  Patient endorses 24 hours of diarrhea.  No nausea or vomiting.  No fever or chills.  No abdominal pain.  No changes in appetite, medications, or food. He has had about 4-6 loose and watery stools in the past 24 hours.   Patient appears to be orthostatic on assessment of vitals but denies dizziness with position changes.  CBC was checked today by radiation oncology and was unremarkable.  I offered to check additional labs today but patient declined.  I also offered IV fluids today but patient declined.  He would like to attempt to increase oral fluids tonight and then return to the clinic tomorrow for evaluation and consideration of IV fluids if needed.    Patient says that his goals are to continue with radiation treatment for now.  We have discussed the option of hospice involvement once radiation is completed.    We discussed CODE STATUS.  Patient says that he would not want to be resuscitated or have his life prolonged artificially on machines.  His daughter verbalized her agreement with patient being a DNR/DNI.  I completed a DNR order for him to take  home.  Case and plan discussed with Dr. Tasia Catchings  PLAN: -Best supportive care with plan for future hospice involvement -Will add lab and [facility] encounter for tomorrow with consideration of IV fluids if needed -Check CBC, Cmet, magnesium, and stool  studies including C. difficile PCR -DNR/DNI   Patient expressed understanding and was in agreement with this plan. He also understands that He can call the clinic at any time with any questions, concerns, or complaints.     Time Total: 20 minutes  Visit consisted of counseling and education dealing with the complex and emotionally intense issues of symptom management and palliative care in the setting of serious and potentially life-threatening illness.Greater than 50%  of this time was spent counseling and coordinating care related to the above assessment and plan.  Signed by: Altha Harm, PhD, NP-C

## 2019-09-02 ENCOUNTER — Inpatient Hospital Stay: Payer: Medicare Other

## 2019-09-02 ENCOUNTER — Inpatient Hospital Stay: Payer: Medicare Other | Admitting: Oncology

## 2019-09-02 ENCOUNTER — Other Ambulatory Visit: Payer: Self-pay

## 2019-09-02 ENCOUNTER — Ambulatory Visit
Admission: RE | Admit: 2019-09-02 | Discharge: 2019-09-02 | Disposition: A | Discharge status: Home / Self Care | Payer: Medicare Other | Source: Ambulatory Visit | Attending: Radiation Oncology | Admitting: Radiation Oncology

## 2019-09-02 DIAGNOSIS — C7802 Secondary malignant neoplasm of left lung: Secondary | ICD-10-CM | POA: Diagnosis not present

## 2019-09-02 DIAGNOSIS — C3490 Malignant neoplasm of unspecified part of unspecified bronchus or lung: Secondary | ICD-10-CM | POA: Diagnosis not present

## 2019-09-02 DIAGNOSIS — Z510 Encounter for antineoplastic radiation therapy: Secondary | ICD-10-CM | POA: Diagnosis not present

## 2019-09-02 DIAGNOSIS — C3411 Malignant neoplasm of upper lobe, right bronchus or lung: Secondary | ICD-10-CM | POA: Diagnosis not present

## 2019-09-02 DIAGNOSIS — R197 Diarrhea, unspecified: Secondary | ICD-10-CM

## 2019-09-02 LAB — CBC WITH DIFFERENTIAL/PLATELET
Abs Immature Granulocytes: 0.02 10*3/uL (ref 0.00–0.07)
Basophils Absolute: 0 10*3/uL (ref 0.0–0.1)
Basophils Relative: 1 %
Eosinophils Absolute: 0 10*3/uL (ref 0.0–0.5)
Eosinophils Relative: 1 %
HCT: 43.4 % (ref 39.0–52.0)
Hemoglobin: 14.9 g/dL (ref 13.0–17.0)
Immature Granulocytes: 1 %
Lymphocytes Relative: 11 %
Lymphs Abs: 0.5 10*3/uL — ABNORMAL LOW (ref 0.7–4.0)
MCH: 33.7 pg (ref 26.0–34.0)
MCHC: 34.3 g/dL (ref 30.0–36.0)
MCV: 98.2 fL (ref 80.0–100.0)
Monocytes Absolute: 0.7 10*3/uL (ref 0.1–1.0)
Monocytes Relative: 15 %
Neutro Abs: 3.2 10*3/uL (ref 1.7–7.7)
Neutrophils Relative %: 71 %
Platelets: 226 10*3/uL (ref 150–400)
RBC: 4.42 MIL/uL (ref 4.22–5.81)
RDW: 13.2 % (ref 11.5–15.5)
WBC: 4.4 10*3/uL (ref 4.0–10.5)
nRBC: 0 % (ref 0.0–0.2)

## 2019-09-02 LAB — COMPREHENSIVE METABOLIC PANEL
ALT: 26 U/L (ref 0–44)
AST: 26 U/L (ref 15–41)
Albumin: 4.1 g/dL (ref 3.5–5.0)
Alkaline Phosphatase: 80 U/L (ref 38–126)
Anion gap: 8 (ref 5–15)
BUN: 14 mg/dL (ref 8–23)
CO2: 26 mmol/L (ref 22–32)
Calcium: 9.1 mg/dL (ref 8.9–10.3)
Chloride: 101 mmol/L (ref 98–111)
Creatinine, Ser: 0.65 mg/dL (ref 0.61–1.24)
GFR calc Af Amer: >60 mL/min (ref >60)
GFR calc non Af Amer: >60 mL/min (ref >60)
Glucose, Bld: 149 mg/dL — ABNORMAL HIGH (ref 70–99)
Potassium: 4.8 mmol/L (ref 3.5–5.1)
Sodium: 135 mmol/L (ref 135–145)
Total Bilirubin: 0.7 mg/dL (ref 0.3–1.2)
Total Protein: 7.3 g/dL (ref 6.5–8.1)

## 2019-09-02 LAB — MAGNESIUM: Magnesium: 1.9 mg/dL (ref 1.7–2.4)

## 2019-09-05 ENCOUNTER — Ambulatory Visit
Admission: RE | Admit: 2019-09-05 | Discharge: 2019-09-05 | Disposition: A | Discharge status: Home / Self Care | Payer: Medicare Other | Source: Ambulatory Visit | Attending: Radiation Oncology | Admitting: Radiation Oncology

## 2019-09-05 ENCOUNTER — Other Ambulatory Visit: Payer: Self-pay

## 2019-09-05 DIAGNOSIS — Z510 Encounter for antineoplastic radiation therapy: Secondary | ICD-10-CM | POA: Diagnosis not present

## 2019-09-05 DIAGNOSIS — C7802 Secondary malignant neoplasm of left lung: Secondary | ICD-10-CM | POA: Diagnosis not present

## 2019-09-05 DIAGNOSIS — C3411 Malignant neoplasm of upper lobe, right bronchus or lung: Secondary | ICD-10-CM | POA: Diagnosis not present

## 2019-09-06 ENCOUNTER — Ambulatory Visit
Admission: RE | Admit: 2019-09-06 | Discharge: 2019-09-06 | Disposition: A | Discharge status: Home / Self Care | Payer: Medicare Other | Source: Ambulatory Visit | Attending: Radiation Oncology | Admitting: Radiation Oncology

## 2019-09-06 ENCOUNTER — Other Ambulatory Visit: Payer: Self-pay

## 2019-09-06 DIAGNOSIS — C3411 Malignant neoplasm of upper lobe, right bronchus or lung: Secondary | ICD-10-CM | POA: Diagnosis not present

## 2019-09-06 DIAGNOSIS — C7802 Secondary malignant neoplasm of left lung: Secondary | ICD-10-CM | POA: Diagnosis not present

## 2019-09-06 DIAGNOSIS — Z510 Encounter for antineoplastic radiation therapy: Secondary | ICD-10-CM | POA: Diagnosis not present

## 2019-09-07 ENCOUNTER — Ambulatory Visit
Admission: RE | Admit: 2019-09-07 | Discharge: 2019-09-07 | Disposition: A | Discharge status: Home / Self Care | Payer: Medicare Other | Source: Ambulatory Visit | Attending: Radiation Oncology | Admitting: Radiation Oncology

## 2019-09-07 ENCOUNTER — Encounter: Payer: Self-pay | Admitting: *Deleted

## 2019-09-07 ENCOUNTER — Other Ambulatory Visit: Payer: Self-pay

## 2019-09-07 DIAGNOSIS — C3411 Malignant neoplasm of upper lobe, right bronchus or lung: Secondary | ICD-10-CM | POA: Diagnosis not present

## 2019-09-07 DIAGNOSIS — C7802 Secondary malignant neoplasm of left lung: Secondary | ICD-10-CM | POA: Diagnosis not present

## 2019-09-07 DIAGNOSIS — Z510 Encounter for antineoplastic radiation therapy: Secondary | ICD-10-CM | POA: Diagnosis not present

## 2019-09-08 ENCOUNTER — Other Ambulatory Visit: Payer: Self-pay

## 2019-09-08 ENCOUNTER — Ambulatory Visit: Payer: Medicare Other | Admitting: Radiation Oncology

## 2019-09-08 ENCOUNTER — Ambulatory Visit
Admission: RE | Admit: 2019-09-08 | Discharge: 2019-09-08 | Disposition: A | Discharge status: Home / Self Care | Payer: Medicare Other | Source: Ambulatory Visit | Attending: Radiation Oncology | Admitting: Radiation Oncology

## 2019-09-08 DIAGNOSIS — F17200 Nicotine dependence, unspecified, uncomplicated: Secondary | ICD-10-CM | POA: Diagnosis not present

## 2019-09-08 DIAGNOSIS — C3490 Malignant neoplasm of unspecified part of unspecified bronchus or lung: Secondary | ICD-10-CM | POA: Diagnosis not present

## 2019-09-08 DIAGNOSIS — E1165 Type 2 diabetes mellitus with hyperglycemia: Secondary | ICD-10-CM | POA: Diagnosis not present

## 2019-09-08 DIAGNOSIS — Z510 Encounter for antineoplastic radiation therapy: Secondary | ICD-10-CM | POA: Diagnosis not present

## 2019-09-08 DIAGNOSIS — C3411 Malignant neoplasm of upper lobe, right bronchus or lung: Secondary | ICD-10-CM | POA: Diagnosis not present

## 2019-09-09 ENCOUNTER — Other Ambulatory Visit: Payer: Self-pay

## 2019-09-09 ENCOUNTER — Ambulatory Visit
Admission: RE | Admit: 2019-09-09 | Discharge: 2019-09-09 | Disposition: A | Discharge status: Home / Self Care | Payer: Medicare Other | Source: Ambulatory Visit | Attending: Radiation Oncology | Admitting: Radiation Oncology

## 2019-09-09 DIAGNOSIS — C7802 Secondary malignant neoplasm of left lung: Secondary | ICD-10-CM | POA: Diagnosis not present

## 2019-09-09 DIAGNOSIS — Z510 Encounter for antineoplastic radiation therapy: Secondary | ICD-10-CM | POA: Diagnosis not present

## 2019-09-09 DIAGNOSIS — C3411 Malignant neoplasm of upper lobe, right bronchus or lung: Secondary | ICD-10-CM | POA: Diagnosis not present

## 2019-09-12 ENCOUNTER — Ambulatory Visit
Admission: RE | Admit: 2019-09-12 | Discharge: 2019-09-12 | Disposition: A | Discharge status: Home / Self Care | Payer: Medicare Other | Source: Ambulatory Visit | Attending: Radiation Oncology | Admitting: Radiation Oncology

## 2019-09-12 ENCOUNTER — Other Ambulatory Visit: Payer: Self-pay

## 2019-09-12 DIAGNOSIS — C7802 Secondary malignant neoplasm of left lung: Secondary | ICD-10-CM | POA: Diagnosis not present

## 2019-09-12 DIAGNOSIS — C3411 Malignant neoplasm of upper lobe, right bronchus or lung: Secondary | ICD-10-CM | POA: Diagnosis not present

## 2019-09-12 DIAGNOSIS — Z510 Encounter for antineoplastic radiation therapy: Secondary | ICD-10-CM | POA: Diagnosis not present

## 2019-09-13 ENCOUNTER — Ambulatory Visit
Admission: RE | Admit: 2019-09-13 | Discharge: 2019-09-13 | Disposition: A | Discharge status: Home / Self Care | Payer: Medicare Other | Source: Ambulatory Visit | Attending: Radiation Oncology | Admitting: Radiation Oncology

## 2019-09-13 ENCOUNTER — Other Ambulatory Visit: Payer: Self-pay

## 2019-09-13 ENCOUNTER — Encounter: Payer: Self-pay | Admitting: *Deleted

## 2019-09-13 DIAGNOSIS — Z510 Encounter for antineoplastic radiation therapy: Secondary | ICD-10-CM | POA: Diagnosis not present

## 2019-09-13 DIAGNOSIS — C7802 Secondary malignant neoplasm of left lung: Secondary | ICD-10-CM | POA: Diagnosis not present

## 2019-09-13 DIAGNOSIS — C3411 Malignant neoplasm of upper lobe, right bronchus or lung: Secondary | ICD-10-CM | POA: Diagnosis not present

## 2019-10-03 ENCOUNTER — Telehealth: Payer: Self-pay | Admitting: *Deleted

## 2019-10-03 NOTE — Telephone Encounter (Signed)
Pt's daughter, [name], left a message stating that they would like to cancel pt's appt with Dr. Tasia Catchings on 12/28 but would still like to keep his appt with Dr. Baruch Gouty on that day. Stated that they have gotten multiple bills from the hospital and only want to keep appts that they feel are necessary at this time due to financial strain.   Spoke with pt's daughter, [name], and informed that will cancel his appt with Dr. Tasia Catchings on 12/28 and that we can always have him rescheduled to see her if he wishes to pursue any further systemic treatment in the future. Pt's daughter verbalized understanding. Nothing further needed at this time.

## 2019-10-10 ENCOUNTER — Telehealth: Payer: Self-pay | Admitting: *Deleted

## 2019-10-10 DIAGNOSIS — R410 Disorientation, unspecified: Secondary | ICD-10-CM

## 2019-10-10 DIAGNOSIS — C3490 Malignant neoplasm of unspecified part of unspecified bronchus or lung: Secondary | ICD-10-CM
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
## 2019-10-10 NOTE — Telephone Encounter (Signed)
Patient last saw by Dr. Tasia Catchings in 05/2019.  He declined chemotherapy at that time but did receive radiation and has been evaluated by Pete Glatter, NP for palliative care.

## 2019-10-11 NOTE — Telephone Encounter (Signed)
Per Dr. Tasia Catchings:  if it is due to glucose then maybe his PCP can help giving him the letter. If he develops new brain metastatic disease then I will write  Order placed for brain MRI and will discuss with pt's daughter. Will forward to PCP to see if can help with letter.

## 2019-10-11 NOTE — Telephone Encounter (Signed)
Dr. Jeananne Rama retired at the end of October, so we're handling his patients. We have not seen patient since February- if he comes in for an appointment, we can see about getting him a letter. Thanks!

## 2019-10-11 NOTE — Telephone Encounter (Signed)
Spoke with pt's daughter, Colin Novak, to inform that pt will need to make follow up appt with PCP office to further discuss letter for the [facility]. Colin Novak stated that she will call pt's endocrinologist first since their office currently manages pt's diabetic medications.  Also informed pt's daughter that Dr. Tasia Catchings recommends follow up brain MRI to further evaluate confusion and disorientation. Pt's daughter stated that she will discuss with endocrinologist first to rule out uncontrolled sugars as cause for confusion/disorientation then will let me know if brain MRI is still needed after discussing with endocrinologist.  Colin Novak stated that they are struggling financially and the cost of further imaging is concerning for her and they may not be able to afford it. Informed Colin Novak that will refer him to Elease Etienne, SW to further discuss financial assistance.   Colin Novak verbalized understanding. Nothing further needed at this time.

## 2019-10-11 NOTE — Telephone Encounter (Signed)
Thank you Megan. I will relay the message to his daughter.

## 2019-10-24 ENCOUNTER — Ambulatory Visit: Payer: Medicare Other | Admitting: Oncology

## 2019-10-24 ENCOUNTER — Other Ambulatory Visit: Payer: Self-pay | Admitting: *Deleted

## 2019-10-24 ENCOUNTER — Ambulatory Visit
Admission: RE | Admit: 2019-10-24 | Discharge: 2019-10-24 | Disposition: A | Discharge status: Home / Self Care | Payer: Medicare Other | Source: Ambulatory Visit | Attending: Radiation Oncology | Admitting: Radiation Oncology

## 2019-10-24 ENCOUNTER — Other Ambulatory Visit: Payer: Self-pay

## 2019-10-24 ENCOUNTER — Encounter: Payer: Self-pay | Admitting: Radiation Oncology

## 2019-10-24 DIAGNOSIS — C3490 Malignant neoplasm of unspecified part of unspecified bronchus or lung: Secondary | ICD-10-CM

## 2019-11-05 ENCOUNTER — Encounter: Payer: Self-pay | Admitting: *Deleted

## 2019-11-16 DIAGNOSIS — F17200 Nicotine dependence, unspecified, uncomplicated: Secondary | ICD-10-CM | POA: Diagnosis not present

## 2019-11-16 DIAGNOSIS — E1165 Type 2 diabetes mellitus with hyperglycemia: Secondary | ICD-10-CM | POA: Diagnosis not present

## 2019-11-16 DIAGNOSIS — C3490 Malignant neoplasm of unspecified part of unspecified bronchus or lung: Secondary | ICD-10-CM | POA: Diagnosis not present

## 2019-11-29 DIAGNOSIS — F17200 Nicotine dependence, unspecified, uncomplicated: Secondary | ICD-10-CM | POA: Diagnosis not present

## 2019-11-29 DIAGNOSIS — E11649 Type 2 diabetes mellitus with hypoglycemia without coma: Secondary | ICD-10-CM | POA: Diagnosis not present

## 2019-11-29 DIAGNOSIS — C3490 Malignant neoplasm of unspecified part of unspecified bronchus or lung: Secondary | ICD-10-CM | POA: Diagnosis not present

## 2019-11-29 DIAGNOSIS — Z794 Long term (current) use of insulin: Secondary | ICD-10-CM | POA: Diagnosis not present

## 2019-12-28 DIAGNOSIS — Z794 Long term (current) use of insulin: Secondary | ICD-10-CM | POA: Diagnosis not present

## 2019-12-28 DIAGNOSIS — C3490 Malignant neoplasm of unspecified part of unspecified bronchus or lung: Secondary | ICD-10-CM | POA: Diagnosis not present

## 2019-12-28 DIAGNOSIS — E1165 Type 2 diabetes mellitus with hyperglycemia: Secondary | ICD-10-CM | POA: Diagnosis not present

## 2019-12-28 LAB — HEMOGLOBIN A1C: Hemoglobin A1C: 12.3

## 2020-01-06 ENCOUNTER — Other Ambulatory Visit: Payer: Self-pay

## 2020-01-06 ENCOUNTER — Ambulatory Visit
Admission: RE | Admit: 2020-01-06 | Discharge: 2020-01-06 | Disposition: A | Discharge status: Home / Self Care | Payer: Medicare Other | Source: Ambulatory Visit | Attending: Radiation Oncology | Admitting: Radiation Oncology

## 2020-01-06 DIAGNOSIS — C3490 Malignant neoplasm of unspecified part of unspecified bronchus or lung: Secondary | ICD-10-CM | POA: Diagnosis not present

## 2020-01-06 LAB — POCT I-STAT CREATININE: Creatinine, Ser: 0.6 mg/dL — ABNORMAL LOW (ref 0.61–1.24)

## 2020-01-06 MED ORDER — IOHEXOL 300 MG/ML  SOLN
75.0000 mL | Freq: Once | INTRAMUSCULAR | Status: AC | PRN
  Administered 2020-01-06: 75 mL via INTRAVENOUS

## 2020-01-12 ENCOUNTER — Other Ambulatory Visit: Payer: Self-pay

## 2020-01-12 ENCOUNTER — Ambulatory Visit
Admission: RE | Admit: 2020-01-12 | Discharge: 2020-01-12 | Disposition: A | Discharge status: Home / Self Care | Payer: Medicare Other | Source: Ambulatory Visit | Attending: Radiation Oncology | Admitting: Radiation Oncology

## 2020-01-12 VITALS — BP 100/72 | HR 81 | Temp 94.9°F | Resp 20 | Wt 119.3 lb

## 2020-01-12 DIAGNOSIS — C3490 Malignant neoplasm of unspecified part of unspecified bronchus or lung: Secondary | ICD-10-CM

## 2020-01-12 DIAGNOSIS — C3411 Malignant neoplasm of upper lobe, right bronchus or lung: Secondary | ICD-10-CM | POA: Insufficient documentation

## 2020-01-12 DIAGNOSIS — R51 Headache with orthostatic component, not elsewhere classified: Secondary | ICD-10-CM | POA: Diagnosis not present

## 2020-01-12 NOTE — Progress Notes (Signed)
Radiation Oncology Follow up Note  Name: Colin Novak   Date:   01/12/2020 MRN:  [id number] DOB: August 02, 1946    This 74 y.o. male presents to the clinic today for 66-month follow-up status post split course radiation therapy for stage III non-small cell lung cancer of the right upper lobe.  REFERRING PROVIDER: Guadalupe Maple, MD  HPI: Patient is a 74 year old male now out 4 months having completed concurrent chemoradiation therapy in a split course fashion for stage III non-small cell lung cancer of the right upper lobe.  Seen today in routine follow-up he is doing fair.  According to notes he is having some confusion and some occasional headaches..  Unfortunately recent CT scan showed interval increase of the right paramediastinal and hilar mass.  Also multiple areas of increased density in the left lower lobe consistent with multifocal adenocarcinoma/metastasis.  He specifically denies hemoptysis chest tightness.  Does have a mild mild nonproductive cough.  COMPLICATIONS OF TREATMENT: none  FOLLOW UP COMPLIANCE: keeps appointments   PHYSICAL EXAM:  BP 100/72 (BP Location: Left Arm, Patient Position: Sitting)   Pulse 81   Temp (!) 94.9 F (34.9 C) (Tympanic)   Resp 20   Wt 119 lb 4.8 oz (54.1 kg)   BMI 17.62 kg/m  Frail-appearing male in NAD.  Well-developed well-nourished patient in NAD. HEENT reveals PERLA, EOMI, discs not visualized.  Oral cavity is clear. No oral mucosal lesions are identified. Neck is clear without evidence of cervical or supraclavicular adenopathy. Lungs are clear to A&P. Cardiac examination is essentially unremarkable with regular rate and rhythm without murmur rub or thrill. Abdomen is benign with no organomegaly or masses noted. Motor sensory and DTR levels are equal and symmetric in the upper and lower extremities. Cranial nerves II through XII are grossly intact. Proprioception is intact. No peripheral adenopathy or edema is identified. No motor or sensory  levels are noted. Crude visual fields are within normal range.  RADIOLOGY RESULTS: CT scan of the chest reviewed compatible with above-stated findings  PLAN: I have reviewed his CT scans with his daughter.  I am making a follow-up appointment with medical oncology for possible systemic treatment i.e. chemotherapy or immunotherapy.  I also ordered MRI scan of the brain to rule out metastatic disease I will see the patient back shortly after his MRI scan if there is metastatic disease I would offer palliative radiation therapy.  Patient and daughter both seem to comprehend my recommendations well.  I would like to take this opportunity to thank you for allowing me to participate in the care of your patient.Noreene Filbert, MD

## 2020-01-13 ENCOUNTER — Encounter: Payer: Self-pay | Admitting: Oncology

## 2020-01-13 NOTE — Progress Notes (Signed)
Patient is coming in for appointment mentions pain in his lower back and knees. He is doing well otherwise. He denies any covid symptoms and denies questions but states his daughter may have some.

## 2020-01-16 ENCOUNTER — Inpatient Hospital Stay: Payer: Medicare Other

## 2020-01-16 ENCOUNTER — Encounter: Payer: Self-pay | Admitting: Oncology

## 2020-01-16 ENCOUNTER — Inpatient Hospital Stay: Payer: Medicare Other | Attending: Oncology | Admitting: Oncology

## 2020-01-16 ENCOUNTER — Other Ambulatory Visit: Payer: Self-pay

## 2020-01-16 VITALS — BP 116/72 | HR 109 | Temp 96.6°F | Resp 18 | Wt 115.7 lb

## 2020-01-16 DIAGNOSIS — E1165 Type 2 diabetes mellitus with hyperglycemia: Secondary | ICD-10-CM

## 2020-01-16 DIAGNOSIS — C3490 Malignant neoplasm of unspecified part of unspecified bronchus or lung: Secondary | ICD-10-CM

## 2020-01-16 DIAGNOSIS — E46 Unspecified protein-calorie malnutrition: Secondary | ICD-10-CM | POA: Diagnosis not present

## 2020-01-16 DIAGNOSIS — E785 Hyperlipidemia, unspecified: Secondary | ICD-10-CM | POA: Diagnosis not present

## 2020-01-16 DIAGNOSIS — Z66 Do not resuscitate: Secondary | ICD-10-CM | POA: Diagnosis not present

## 2020-01-16 DIAGNOSIS — Z794 Long term (current) use of insulin: Secondary | ICD-10-CM | POA: Diagnosis not present

## 2020-01-16 DIAGNOSIS — E43 Unspecified severe protein-calorie malnutrition: Secondary | ICD-10-CM | POA: Diagnosis not present

## 2020-01-16 DIAGNOSIS — R64 Cachexia: Secondary | ICD-10-CM | POA: Diagnosis not present

## 2020-01-16 DIAGNOSIS — E1136 Type 2 diabetes mellitus with diabetic cataract: Secondary | ICD-10-CM | POA: Insufficient documentation

## 2020-01-16 DIAGNOSIS — Z9221 Personal history of antineoplastic chemotherapy: Secondary | ICD-10-CM | POA: Diagnosis not present

## 2020-01-16 DIAGNOSIS — Z515 Encounter for palliative care: Secondary | ICD-10-CM | POA: Diagnosis not present

## 2020-01-16 DIAGNOSIS — M549 Dorsalgia, unspecified: Secondary | ICD-10-CM | POA: Insufficient documentation

## 2020-01-16 DIAGNOSIS — J449 Chronic obstructive pulmonary disease, unspecified: Secondary | ICD-10-CM | POA: Diagnosis not present

## 2020-01-16 DIAGNOSIS — E119 Type 2 diabetes mellitus without complications: Secondary | ICD-10-CM | POA: Insufficient documentation

## 2020-01-16 DIAGNOSIS — C3480 Malignant neoplasm of overlapping sites of unspecified bronchus and lung: Secondary | ICD-10-CM | POA: Diagnosis not present

## 2020-01-16 DIAGNOSIS — Z7982 Long term (current) use of aspirin: Secondary | ICD-10-CM | POA: Diagnosis not present

## 2020-01-16 DIAGNOSIS — K219 Gastro-esophageal reflux disease without esophagitis: Secondary | ICD-10-CM | POA: Insufficient documentation

## 2020-01-16 DIAGNOSIS — F17210 Nicotine dependence, cigarettes, uncomplicated: Secondary | ICD-10-CM | POA: Diagnosis not present

## 2020-01-16 DIAGNOSIS — Z7189 Other specified counseling: Secondary | ICD-10-CM

## 2020-01-16 DIAGNOSIS — Z79899 Other long term (current) drug therapy: Secondary | ICD-10-CM | POA: Diagnosis not present

## 2020-01-16 LAB — CBC WITH DIFFERENTIAL/PLATELET
Abs Immature Granulocytes: 0.01 10*3/uL (ref 0.00–0.07)
Basophils Absolute: 0.1 10*3/uL (ref 0.0–0.1)
Basophils Relative: 1 %
Eosinophils Absolute: 0 10*3/uL (ref 0.0–0.5)
Eosinophils Relative: 0 %
HCT: 46.4 % (ref 39.0–52.0)
Hemoglobin: 16.3 g/dL (ref 13.0–17.0)
Immature Granulocytes: 0 %
Lymphocytes Relative: 17 %
Lymphs Abs: 0.8 10*3/uL (ref 0.7–4.0)
MCH: 33.4 pg (ref 26.0–34.0)
MCHC: 35.1 g/dL (ref 30.0–36.0)
MCV: 95.1 fL (ref 80.0–100.0)
Monocytes Absolute: 0.6 10*3/uL (ref 0.1–1.0)
Monocytes Relative: 13 %
Neutro Abs: 3.1 10*3/uL (ref 1.7–7.7)
Neutrophils Relative %: 69 %
Platelets: 247 10*3/uL (ref 150–400)
RBC: 4.88 MIL/uL (ref 4.22–5.81)
RDW: 12.2 % (ref 11.5–15.5)
WBC: 4.6 10*3/uL (ref 4.0–10.5)
nRBC: 0 % (ref 0.0–0.2)

## 2020-01-16 LAB — COMPREHENSIVE METABOLIC PANEL
ALT: 33 U/L (ref 0–44)
AST: 30 U/L (ref 15–41)
Albumin: 4.4 g/dL (ref 3.5–5.0)
Alkaline Phosphatase: 110 U/L (ref 38–126)
Anion gap: 10 (ref 5–15)
BUN: 13 mg/dL (ref 8–23)
CO2: 24 mmol/L (ref 22–32)
Calcium: 9.3 mg/dL (ref 8.9–10.3)
Chloride: 98 mmol/L (ref 98–111)
Creatinine, Ser: 0.8 mg/dL (ref 0.61–1.24)
GFR calc Af Amer: >60 mL/min (ref >60)
GFR calc non Af Amer: >60 mL/min (ref >60)
Glucose, Bld: 460 mg/dL — ABNORMAL HIGH (ref 70–99)
Potassium: 4.9 mmol/L (ref 3.5–5.1)
Sodium: 132 mmol/L — ABNORMAL LOW (ref 135–145)
Total Bilirubin: 0.9 mg/dL (ref 0.3–1.2)
Total Protein: 7.7 g/dL (ref 6.5–8.1)

## 2020-01-16 MED ORDER — CYANOCOBALAMIN 1000 MCG/ML IJ SOLN
1000.0000 ug | Freq: Once | INTRAMUSCULAR | Status: AC
  Administered 2020-01-16: 1000 ug via INTRAMUSCULAR
  Filled 2020-01-16: qty 1

## 2020-01-16 NOTE — Progress Notes (Signed)
ON PATHWAY REGIMEN - Non-Small Cell Lung  No Change  Continue With Treatment as Ordered.     A cycle is every 21 days:     Pemetrexed      Carboplatin   **Always confirm dose/schedule in your pharmacy ordering system**  Patient Characteristics: Stage IV Metastatic, Nonsquamous, Initial Chemotherapy/Immunotherapy, PS = 2, ALK or EGFR or ROS1 or NTRK Genomic Alterations - Awaiting Test Results AJCC T Category: TX Current Disease Status: Distant Metastases AJCC N Category: N1 AJCC M Category: M1a AJCC 8 Stage Grouping: IVA Histology: Nonsquamous Cell ROS1 Rearrangement Status: Awaiting Test Results T790M Mutation Status: Not Applicable - EGFR Mutation Negative/Unknown Other Mutations/Biomarkers: No Other Actionable Mutations NTRK Gene Fusion Status: Awaiting Test Results PD-L1 Expression Status: Awaiting Test Results Chemotherapy/Immunotherapy LOT: Initial Chemotherapy/Immunotherapy Molecular Targeted Therapy: Not Appropriate ALK Rearrangement Status: Awaiting Test Results EGFR Mutation Status: Awaiting Test Results BRAF V600E Mutation Status: Awaiting Test Results ECOG Performance Status: 2 Intent of Therapy: Non-Curative / Palliative Intent, Discussed with Patient

## 2020-01-16 NOTE — Progress Notes (Signed)
Hematology/Oncology follow up note [facility] Telephone:([phone] Fax:([phone]   Patient Care Team: Guadalupe Maple, MD as PCP - General (Family Medicine) Gabriel Carina, Betsey Holiday, MD as Physician Assistant (Endocrinology) Kem Parkinson, MD (Ophthalmology) Telford Nab, RN as Registered Nurse Tamala Julian, Jonette Eva, NP as Nurse Practitioner  REFERRING PROVIDER: Guadalupe Maple, MD  CHIEF COMPLAINTS/REASON FOR VISIT:  Follow up for non-small cell lung cancer  HISTORY OF PRESENTING ILLNESS:  Colin Novak is a  74 y.o.  male with PMH listed below was seen in consultation at the request of  Guadalupe Maple, MD  for evaluation of lung mass Patient reports ongoing shortness of breath, cough for  few months. He had CT chest scan done on 06/01/2019 which showed a concerning feature of 9.1 x 5.1 x 3.6 cm right upper lobe mass extending into mediastinum, encasing and occluding right upper lobe bronchus and some of the right upper lobe bronchial branches.  There is also significant narrowing of FVC. He also had a similar appearance of left upper lobe mass without mediastinal extension suspicious for a synchronized primary lung cancer Multiple additional smaller bilateral lung nodules, left greater than right. Mildly enlarged right hilar and mediastinal lymph nodes suspicious for metastatic lymphadenopathy. 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 Daughter [name] is his power of attorney. Denies fever or chills.  INTERVAL HISTORY Colin Novak is a 74 y.o. male who has above history reviewed by me today presents for follow up visit for management of  non-small cell lung cancer. Problems and complaints are listed below: Patient was seen by me on 06/20/2019.  At that time, patient has 2 separate lung tumor.  Possibility of stage IV lung adenocarcinoma TX N1 M1 discussed  with patient given that he has tumor in the contralateral lobe.  Systemic chemotherapy with immunotherapy was recommended. He may have 2 separate primary lung cancer.  Then he would benefit from definitive concurrent chemoradiation followed by immunotherapy for maintenance. Patient has decided at that time that he does not want any systemic chemotherapy.  He underwent SBRT to right upper lobe lesion.  Repeat CT scan showed interval increase of the right para mediastinal and hilar mass and also multiple areas of increased density in the left lower lobe consistent with multifocal adenocarcinoma/metastasis. Patient has also reported some occasional headache and back pain 5 out of 10.  No lower extremity weakness He has gained some weight since his last visit with me. Continues to feel weak.  Chronic shortness of breath with exertion.  Cough.  Review of Systems  Constitutional: Positive for appetite change, fatigue and unexpected weight change. Negative for chills, diaphoresis and fever.  HENT:   Negative for hearing loss, lump/mass, nosebleeds, sore throat and voice change.   Eyes: Negative for eye problems and icterus.  Respiratory: Positive for cough and shortness of breath. Negative for chest tightness, hemoptysis and wheezing.   Cardiovascular: Negative for chest pain and leg swelling.  Gastrointestinal: Negative for abdominal distention, abdominal pain, blood in stool, diarrhea, nausea and rectal pain.  Endocrine: Negative for hot flashes.  Genitourinary: Negative for bladder incontinence, difficulty urinating, dysuria, frequency, hematuria and nocturia.   Musculoskeletal: Positive for back pain. Negative for arthralgias, flank pain, gait problem and myalgias.  Skin: Negative for itching and rash.  Neurological: Negative for dizziness, gait problem, headaches, light-headedness, numbness and seizures.  Hematological: Negative for adenopathy. Does not  bruise/bleed easily.    Psychiatric/Behavioral: Negative for confusion and decreased concentration. The patient is not nervous/anxious.     MEDICAL HISTORY:  Past Medical History:  Diagnosis Date  . Anxiety   . COPD (chronic obstructive pulmonary disease) ([place])   . Diabetes mellitus without complication ([place])    type 2  . GERD (gastroesophageal reflux disease)   . Hyperlipidemia     SURGICAL HISTORY: Past Surgical History:  Procedure Laterality Date  . APPENDECTOMY    . ENDOBRONCHIAL ULTRASOUND Bilateral 06/13/2019   Procedure: ENDOBRONCHIAL ULTRASOUND, BILATERAL;  Surgeon: [name], MD;  Location: [facility] ORS;  Service: Pulmonary;  Laterality: Bilateral;  . EYE SURGERY Bilateral    cataract    SOCIAL HISTORY: Social History   Socioeconomic History  . Marital status: Widowed    Spouse name: Not on file  . Number of children: 1  . Years of education: Not on file  . Highest education level: High school graduate  Occupational History  . Occupation: retired  Tobacco Use  . Smoking status: Current Every Day Smoker    Packs/day: 1.00    Types: Cigarettes  . Smokeless tobacco: Former Systems developer    Types: Chew    Quit date: 06/10/1959  . Tobacco comment: 1 pack daily- 06/09/2019  Substance and Sexual Activity  . Alcohol use: No  . Drug use: No  . Sexual activity: Not Currently  Other Topics Concern  . Not on file  Social History Narrative  . Not on file   Social Determinants of Health   Financial Resource Strain:   . Difficulty of Paying Living Expenses:   Food Insecurity:   . Worried About Charity fundraiser in the Last Year:   . Arboriculturist in the Last Year:   Transportation Needs:   . Film/video editor (Medical):   Marland Kitchen Lack of Transportation (Non-Medical):   Physical Activity:   . Days of Exercise per Week:   . Minutes of Exercise per Session:   Stress:   . Feeling of Stress :   Social Connections:   . Frequency of Communication with Friends and Family:   .  Frequency of Social Gatherings with Friends and Family:   . Attends Religious Services:   . Active Member of Clubs or Organizations:   . Attends Archivist Meetings:   Marland Kitchen Marital Status:   Intimate Partner Violence:   . Fear of Current or Ex-Partner:   . Emotionally Abused:   Marland Kitchen Physically Abused:   . Sexually Abused:     FAMILY HISTORY: Family History  Problem Relation Age of Onset  . Cancer Mother        lung (non smoker)  . Diabetes Father   . Hypertension Father   . Lymphoma Brother   . Prostate cancer Brother     ALLERGIES:  is allergic to penicillins.  MEDICATIONS:  Current Outpatient Medications  Medication Sig Dispense Refill  . albuterol (PROAIR HFA) 108 (90 Base) MCG/ACT inhaler Inhale 1-2 puffs into the lungs every 6 (six) hours as needed for wheezing or shortness of breath (use as needed for chest congestion.). 1 g 3  . aspirin EC 81 MG tablet Take 81 mg by mouth daily.    . citalopram (CELEXA) 20 MG tablet Take 1 tablet (20 mg total) by mouth daily. 90 tablet 4  . Dextromethorphan-guaiFENesin (ROBITUSSIN DM PO) Take 5 mLs by mouth at bedtime.     Marland Kitchen ezetimibe-simvastatin (VYTORIN) 10-40 MG tablet Take 1 tablet  by mouth daily. 90 tablet 4  . Fluticasone-Umeclidin-Vilant (TRELEGY ELLIPTA) 100-62.5-25 MCG/INH AEPB Inhale 1 applicator into the lungs daily. Rinse mouth after use. 1 each 10  . HUMALOG KWIKPEN 100 UNIT/ML KwikPen 16 Units. Sliding scale up to 12 units    . LANTUS SOLOSTAR 100 UNIT/ML Solostar Pen INJECT 10-20 UNITS INTO THE SKIN DAILY 5 pen 12  . metFORMIN (GLUCOPHAGE) 500 MG tablet TAKE ONE TABLET BY MOUTH TWICE DAILY 60 tablet 12  . Multiple Vitamin (MULTIVITAMIN) tablet Take 1 tablet by mouth daily.    Marland Kitchen NOVOFINE 32G X 6 MM MISC USE AS DIRECTED. 5 INJECTIONS A DAY 900 each 0  . omeprazole (PRILOSEC) 20 MG capsule Take 1 capsule (20 mg total) by mouth daily. 30 capsule 11  . pioglitazone (ACTOS) 30 MG tablet Take 30 mg by mouth daily.   1  .  pseudoephedrine-acetaminophen (TYLENOL SINUS) 30-500 MG TABS tablet Take 2 tablets by mouth every 4 (four) hours as needed. Only using twice daily    . dexamethasone (DECADRON) 4 MG tablet Take 1 tab two times a day the day before Alimta chemo, then take 2 tabs once a day for 3 days starting the day after chemo. (Patient not taking: Reported on 10/24/2019) 30 tablet 1  . folic acid (FOLVITE) 1 MG tablet Take 1 tablet (1 mg total) by mouth daily. Start 5-7 days before Alimta chemotherapy. Continue until 21 days after Alimta completed. (Patient not taking: Reported on 01/13/2020) 100 tablet 3  . glucose blood (ONE TOUCH ULTRA TEST) test strip 1 each by Other route 2 (two) times daily as needed for other. Dx: E11.9 (Patient not taking: Reported on 01/13/2020) 100 each 12  . insulin aspart (NOVOLOG) 100 UNIT/ML injection Inject into the skin 3 (three) times daily before meals. Sliding scale    . lidocaine-prilocaine (EMLA) cream Apply to affected area once (Patient not taking: Reported on 01/13/2020) 30 g 3  . ondansetron (ZOFRAN) 8 MG tablet Take 1 tablet (8 mg total) by mouth 2 (two) times daily as needed (Nausea or vomiting). Start if needed on the third day after chemotherapy. (Patient not taking: Reported on 01/13/2020) 30 tablet 1  . oxymetazoline (AFRIN) 0.05 % nasal spray Place 2 sprays into both nostrils 2 (two) times daily as needed.     . predniSONE (DELTASONE) 20 MG tablet Take 20 mg by mouth daily with breakfast.    . prochlorperazine (COMPAZINE) 10 MG tablet Take 1 tablet (10 mg total) by mouth every 6 (six) hours as needed (Nausea or vomiting). (Patient not taking: Reported on 01/13/2020) 30 tablet 1  . triamcinolone (NASACORT) 55 MCG/ACT AERO nasal inhaler Place 1 spray into the nose 2 (two) times daily. (Patient not taking: Reported on 01/13/2020) 1 Inhaler 2   No current facility-administered medications for this visit.     PHYSICAL EXAMINATION: ECOG PERFORMANCE STATUS: 2 - Symptomatic, <50%  confined to bed Vitals:   01/16/20 0835  BP: 116/72  Pulse: (!) 109  Resp: 18  Temp: (!) 96.6 F (35.9 C)  SpO2: 96%   Filed Weights   01/16/20 0835  Weight: 115 lb 11.2 oz (52.5 kg)    Physical Exam Constitutional:      General: He is not in acute distress.    Appearance: He is ill-appearing.     Comments: Cachectic  HENT:     Head: Normocephalic and atraumatic.  Eyes:     General: No scleral icterus.    Pupils: Pupils are equal, round,  and reactive to light.  Neck:     Comments: No neck swelling Cardiovascular:     Rate and Rhythm: Normal rate and regular rhythm.     Heart sounds: Normal heart sounds.  Pulmonary:     Effort: Pulmonary effort is normal. No respiratory distress.     Comments: Decreased breath sounds bilaterally.  Abdominal:     General: Bowel sounds are normal. There is no distension.     Palpations: Abdomen is soft. There is no mass.  Musculoskeletal:        General: No deformity. Normal range of motion.     Cervical back: Normal range of motion and neck supple.     Comments: Clubbing  Skin:    General: Skin is warm and dry.     Findings: No erythema or rash.  Neurological:     General: No focal deficit present.     Mental Status: He is alert and oriented to person, place, and time.     Cranial Nerves: No cranial nerve deficit.     Coordination: Coordination normal.  Psychiatric:        Mood and Affect: Mood normal.     LABORATORY DATA:  I have reviewed the data as listed Lab Results  Component Value Date   WBC 4.6 01/16/2020   HGB 16.3 01/16/2020   HCT 46.4 01/16/2020   MCV 95.1 01/16/2020   PLT 247 01/16/2020   Recent Labs    06/07/19 1157 06/07/19 1157 09/02/19 0932 01/06/20 1417 01/16/20 0942  NA 132*  --  135  --  132*  K 4.6  --  4.8  --  4.9  CL 95*  --  101  --  98  CO2 27  --  26  --  24  GLUCOSE 416*  --  149*  --  460*  BUN 15  --  14  --  13  CREATININE 0.73   < > 0.65 0.60* 0.80  CALCIUM 9.3  --  9.1  --  9.3    GFRNONAA >60  --  >60  --  >60  GFRAA >60  --  >60  --  >60  PROT 7.3  --  7.3  --  7.7  ALBUMIN 4.3  --  4.1  --  4.4  AST 26  --  26  --  30  ALT 37  --  26  --  33  ALKPHOS 77  --  80  --  110  BILITOT 0.7  --  0.7  --  0.9   < > = values in this interval not displayed.   Iron/TIBC/Ferritin/ %Sat No results found for: IRON, TIBC, FERRITIN, IRONPCTSAT    RADIOGRAPHIC STUDIES: I have personally reviewed the radiological images as listed and agreed with the findings in the report.  CT Chest W Contrast  Result Date: 01/06/2020 CLINICAL DATA:  Non-small cell lung cancer, follow-up, recent completion of radiotherapy over 4 week. For stage III non-small lung cancer. EXAM: CT CHEST WITH CONTRAST TECHNIQUE: Multidetector CT imaging of the chest was performed during intravenous contrast administration. CONTRAST:  70mL OMNIPAQUE IOHEXOL 300 MG/ML  SOLN COMPARISON:  06/01/2019 FINDINGS: Cardiovascular: Dilation of the ascending thoracic aorta. Also with dilation of the descending thoracic aorta 3.5 cm. Ascending thoracic aorta at 4 cm within 1 cm prior measurement. Heart size is normal without pericardial effusion. Distortion of right hilar structures including right pulmonary artery secondary to right hilar mass. Central pulmonary arteries are otherwise unremarkable. Mediastinum/Nodes:  No evidence of thoracic inlet adenopathy. No axillary lymphadenopathy. Right paramediastinal and hilar mass measuring 5.0 x 2.6 cm previously approximately 5.3 cm in greatest AP dimension and 3.5 cm in greatest thickness. No signs of mediastinal lymphadenopathy. Previously enlarged lymph nodes show though size less than a cm on the current exam. Lungs/Pleura: Severe pulmonary emphysema worse at the lung apices. Left upper lobe nodule measuring 2.5 x 1.9 cm previously approximately 2.4 cm in greatest dimension when measured in a similar fashion. Left lower lobe nodule measuring 1.7 by select 1.7 x 1.5 x 1.1 cm previously  1.4 x 1 cm. Other nodules in the left lower lobe have increased slightly in size in a similar fashion, for instance a nodule along the major fissure in the superior segment of the left lower lobe measures 8 mm on today's study previously approximately 6 mm. Increasing size and density within another left lower lobe nodule (image 119, series 3) 10 mm on today's study, previously 7 mm. Tiny nodules scattered about the left lower lobe despite increasing conspicuity and slight increase in size are otherwise unchanged. Airways are patent. Upper Abdomen: Is adrenal glands are normal. No acute process visualized in the upper abdomen. Musculoskeletal: Spinal degenerative changes. No acute or destructive bone process. IMPRESSION: 1. Slight interval increase in size of the right paramediastinal and hilar mass. 2. Interval increase in size and density of left lower lobe pulmonary nodules. With slight increase in size of dominant left upper lobe nodule. These remain concerning for multifocal adenocarcinoma/metastasis. 3. Interval decrease in size of mediastinal lymph nodes. 4. Severe pulmonary emphysema. 5. Dilation of the ascending thoracic aorta and descending thoracic aorta. Recommend annual imaging followup by CTA or MRA. This recommendation follows 2010 ACCF/AHA/AATS/ACR/ASA/SCA/SCAI/SIR/STS/SVM Guidelines for the Diagnosis and Management of Patients with Thoracic Aortic Disease. Circulation. 2010; 121: X517-G017. Aortic aneurysm NOS (ICD10-I71.9) Emphysema (ICD10-J43.9). Electronically Signed   By: Zetta Bills M.D.   On: 01/06/2020 15:31      ASSESSMENT & PLAN:  1. Malignant neoplasm of overlapping sites of lung, unspecified laterality ([place])   2. Goals of care, counseling/discussion   3. Protein-calorie malnutrition, unspecified severity ([place])   4. Chronic obstructive pulmonary disease, unspecified COPD type ([place])   5. Hyperglycemia due to diabetes mellitus ([facility])    #Non small cell lung carcinoma, status  post SBRT to left upper lobe lung cancer. Clinically stage IV disease CT images were independently reviewed by me and discussed with patient. Patient has slightly interval increase in the size of right paramediastinal and hilar mass. Interval increase in size and density of left lower lobe pulmonary nodules.  With slight increase in size of dominant left upper lobe nodule. Concerning for multifocal adenocarcinoma. Interval decrease in size of mediastinal lymph nodes. Severe pulmonary emphysema.  Ascending thoracic aorta and descending thoracic aorta dilation Likely disease progression. In addition, clinically he has new back pain, concerning for metastatic disease. Recommend patient to obtain PET scan and MRI brain for restaging.  I discussed with patient and her daughter about options. I plan to obtain NGS panel. If no targetable mutation, his treatment options are Alimta/Keytruda plus minus carboplatin, given patient's performance status.  Patient has not decided on chemotherapy.  I will refer him to chemotherapy education course in case he would agree with chemotherapy treatments. We will proceed with vitamin B12 1000 MCG x1 today. Goal of care is discussed.  Palliative intent. Continue follow-up with palliative service.   # Severe malnutrition cachexia, has gained weight during interval.  Continue follow-up with dietitian. Obtain CBC and CMP today. # Hyperglycemia, uncontrolled diabetes, patient sees Dr.Solum. per note, patient is not consistently taking insuline. He forgets.  Follow-up after PET and MRI for further discussion. Orders Placed This Encounter  Procedures  . NM PET Image Restag (PS) Skull Base To Thigh    Standing Status:   Future    Standing Expiration Date:   01/15/2021    Order Specific Question:   ** REASON FOR EXAM (FREE TEXT)    Answer:   lung cancer    Order Specific Question:   If indicated for the ordered procedure, I authorize the administration of a  radiopharmaceutical per Radiology protocol    Answer:   Yes    Order Specific Question:   Radiology Contrast Protocol - do NOT remove file path    Answer:   [email or web address]  . CBC with Differential/Platelet    Standing Status:   Future    Number of Occurrences:   1    Standing Expiration Date:   01/15/2021  . Comprehensive metabolic panel    Standing Status:   Future    Number of Occurrences:   1    Standing Expiration Date:   01/15/2021    All questions were answered. The patient knows to call the clinic with any problems questions or concerns.  cc Guadalupe Maple, MD    Return of visit: After PET and MRI brain  Earlie Server, MD, PhD Hematology Oncology [facility] Pager- [phone] 01/16/2020

## 2020-01-17 ENCOUNTER — Telehealth: Payer: Self-pay

## 2020-01-17 NOTE — Telephone Encounter (Signed)
-----   Message from Earlie Server, MD sent at 01/17/2020  3:07 PM EDT ----- Regarding: RE: Peer-to-peer He is hesitating about the treatment and is doing PET and will discuss with him.  ----- Message ----- From: Floy Sabina Sent: 01/17/2020   1:37 PM EDT To: Vanice Sarah, CMA, Earlie Server, MD Subject: Peer-to-peer                                   This patient's treatment plan went to peer-to-peer. Please call [phone], choose option 1 and then option 3.  ID number is [id number].  Thanks, Aleen Sells

## 2020-01-17 NOTE — Telephone Encounter (Signed)
-----   Message from Earlie Server, MD sent at 01/16/2020  7:49 PM EDT ----- Uncontrolled diabetes, glucose is 460.  Please advise patient to be consistent with his insulin and follow up closed with PCP and endocrinology

## 2020-01-17 NOTE — Telephone Encounter (Signed)
Pt has been notified.

## 2020-01-18 ENCOUNTER — Inpatient Hospital Stay: Payer: Medicare Other | Admitting: Oncology

## 2020-01-18 ENCOUNTER — Ambulatory Visit: Payer: Medicare Other | Admitting: Radiation Oncology

## 2020-01-18 ENCOUNTER — Inpatient Hospital Stay: Payer: Medicare Other

## 2020-01-18 DIAGNOSIS — C3490 Malignant neoplasm of unspecified part of unspecified bronchus or lung: Secondary | ICD-10-CM | POA: Diagnosis not present

## 2020-01-18 DIAGNOSIS — C3411 Malignant neoplasm of upper lobe, right bronchus or lung: Secondary | ICD-10-CM | POA: Diagnosis not present

## 2020-01-23 ENCOUNTER — Other Ambulatory Visit: Payer: Self-pay

## 2020-01-23 ENCOUNTER — Ambulatory Visit
Admission: RE | Admit: 2020-01-23 | Discharge: 2020-01-23 | Disposition: A | Discharge status: Home / Self Care | Payer: Medicare Other | Source: Ambulatory Visit | Attending: Oncology | Admitting: Oncology

## 2020-01-23 DIAGNOSIS — C3490 Malignant neoplasm of unspecified part of unspecified bronchus or lung: Secondary | ICD-10-CM | POA: Diagnosis not present

## 2020-01-23 DIAGNOSIS — I700 Atherosclerosis of aorta: Secondary | ICD-10-CM | POA: Insufficient documentation

## 2020-01-23 DIAGNOSIS — J439 Emphysema, unspecified: Secondary | ICD-10-CM | POA: Diagnosis not present

## 2020-01-23 DIAGNOSIS — E118 Type 2 diabetes mellitus with unspecified complications: Secondary | ICD-10-CM | POA: Insufficient documentation

## 2020-01-23 DIAGNOSIS — I2510 Atherosclerotic heart disease of native coronary artery without angina pectoris: Secondary | ICD-10-CM | POA: Diagnosis not present

## 2020-01-23 DIAGNOSIS — C3480 Malignant neoplasm of overlapping sites of unspecified bronchus and lung: Secondary | ICD-10-CM | POA: Diagnosis not present

## 2020-01-23 DIAGNOSIS — N400 Enlarged prostate without lower urinary tract symptoms: Secondary | ICD-10-CM | POA: Diagnosis not present

## 2020-01-23 DIAGNOSIS — Z794 Long term (current) use of insulin: Secondary | ICD-10-CM | POA: Diagnosis not present

## 2020-01-23 LAB — GLUCOSE, CAPILLARY
Glucose-Capillary: 268 mg/dL — ABNORMAL HIGH (ref 70–99)
Glucose-Capillary: 250 mg/dL — ABNORMAL HIGH (ref 70–99)

## 2020-01-23 MED ORDER — FLUDEOXYGLUCOSE F - 18 (FDG) INJECTION
6.3300 | Freq: Once | INTRAVENOUS | Status: AC | PRN
  Administered 2020-01-23: 6.33 via INTRAVENOUS

## 2020-01-24 ENCOUNTER — Ambulatory Visit
Admission: RE | Admit: 2020-01-24 | Discharge: 2020-01-24 | Disposition: A | Discharge status: Home / Self Care | Payer: Medicare Other | Source: Ambulatory Visit | Attending: Oncology | Admitting: Oncology

## 2020-01-24 DIAGNOSIS — C3490 Malignant neoplasm of unspecified part of unspecified bronchus or lung: Secondary | ICD-10-CM | POA: Insufficient documentation

## 2020-01-24 DIAGNOSIS — R410 Disorientation, unspecified: Secondary | ICD-10-CM | POA: Diagnosis not present

## 2020-01-24 MED ORDER — GADOBUTROL 1 MMOL/ML IV SOLN
5.0000 mL | Freq: Once | INTRAVENOUS | Status: AC | PRN
  Administered 2020-01-24: 5 mL via INTRAVENOUS

## 2020-01-25 ENCOUNTER — Inpatient Hospital Stay (HOSPITAL_BASED_OUTPATIENT_CLINIC_OR_DEPARTMENT_OTHER): Payer: Medicare Other | Admitting: Nurse Practitioner

## 2020-01-25 ENCOUNTER — Inpatient Hospital Stay (HOSPITAL_BASED_OUTPATIENT_CLINIC_OR_DEPARTMENT_OTHER): Payer: Medicare Other | Admitting: Oncology

## 2020-01-25 ENCOUNTER — Encounter: Payer: Self-pay | Admitting: Radiation Oncology

## 2020-01-25 ENCOUNTER — Encounter: Payer: Self-pay | Admitting: Oncology

## 2020-01-25 ENCOUNTER — Other Ambulatory Visit: Payer: Self-pay

## 2020-01-25 ENCOUNTER — Ambulatory Visit
Admission: RE | Admit: 2020-01-25 | Discharge: 2020-01-25 | Disposition: A | Discharge status: Home / Self Care | Payer: Medicare Other | Source: Ambulatory Visit | Attending: Radiation Oncology | Admitting: Radiation Oncology

## 2020-01-25 VITALS — BP 99/64 | HR 81 | Temp 96.6°F | Resp 20 | Wt 117.4 lb

## 2020-01-25 VITALS — BP 103/63 | HR 81 | Temp 96.6°F | Resp 18 | Wt 117.0 lb

## 2020-01-25 DIAGNOSIS — Z9221 Personal history of antineoplastic chemotherapy: Secondary | ICD-10-CM | POA: Diagnosis not present

## 2020-01-25 DIAGNOSIS — R64 Cachexia: Secondary | ICD-10-CM | POA: Diagnosis not present

## 2020-01-25 DIAGNOSIS — I639 Cerebral infarction, unspecified: Secondary | ICD-10-CM | POA: Diagnosis not present

## 2020-01-25 DIAGNOSIS — Z7189 Other specified counseling: Secondary | ICD-10-CM

## 2020-01-25 DIAGNOSIS — C3490 Malignant neoplasm of unspecified part of unspecified bronchus or lung: Secondary | ICD-10-CM

## 2020-01-25 DIAGNOSIS — E1136 Type 2 diabetes mellitus with diabetic cataract: Secondary | ICD-10-CM | POA: Diagnosis not present

## 2020-01-25 DIAGNOSIS — Z923 Personal history of irradiation: Secondary | ICD-10-CM | POA: Diagnosis not present

## 2020-01-25 DIAGNOSIS — C3480 Malignant neoplasm of overlapping sites of unspecified bronchus and lung: Secondary | ICD-10-CM

## 2020-01-25 DIAGNOSIS — R410 Disorientation, unspecified: Secondary | ICD-10-CM | POA: Diagnosis not present

## 2020-01-25 DIAGNOSIS — C3411 Malignant neoplasm of upper lobe, right bronchus or lung: Secondary | ICD-10-CM | POA: Diagnosis not present

## 2020-01-25 DIAGNOSIS — Z7982 Long term (current) use of aspirin: Secondary | ICD-10-CM | POA: Diagnosis not present

## 2020-01-25 DIAGNOSIS — E46 Unspecified protein-calorie malnutrition: Secondary | ICD-10-CM | POA: Diagnosis not present

## 2020-01-25 DIAGNOSIS — E1165 Type 2 diabetes mellitus with hyperglycemia: Secondary | ICD-10-CM | POA: Diagnosis not present

## 2020-01-25 DIAGNOSIS — K219 Gastro-esophageal reflux disease without esophagitis: Secondary | ICD-10-CM | POA: Diagnosis not present

## 2020-01-25 DIAGNOSIS — F17210 Nicotine dependence, cigarettes, uncomplicated: Secondary | ICD-10-CM | POA: Diagnosis not present

## 2020-01-25 DIAGNOSIS — C7802 Secondary malignant neoplasm of left lung: Secondary | ICD-10-CM | POA: Diagnosis not present

## 2020-01-25 DIAGNOSIS — Z515 Encounter for palliative care: Secondary | ICD-10-CM | POA: Diagnosis not present

## 2020-01-25 DIAGNOSIS — R51 Headache with orthostatic component, not elsewhere classified: Secondary | ICD-10-CM | POA: Diagnosis not present

## 2020-01-25 DIAGNOSIS — M549 Dorsalgia, unspecified: Secondary | ICD-10-CM | POA: Diagnosis not present

## 2020-01-25 DIAGNOSIS — E785 Hyperlipidemia, unspecified: Secondary | ICD-10-CM | POA: Diagnosis not present

## 2020-01-25 DIAGNOSIS — E119 Type 2 diabetes mellitus without complications: Secondary | ICD-10-CM | POA: Diagnosis not present

## 2020-01-25 DIAGNOSIS — E43 Unspecified severe protein-calorie malnutrition: Secondary | ICD-10-CM | POA: Diagnosis not present

## 2020-01-25 DIAGNOSIS — Z66 Do not resuscitate: Secondary | ICD-10-CM | POA: Diagnosis not present

## 2020-01-25 DIAGNOSIS — J449 Chronic obstructive pulmonary disease, unspecified: Secondary | ICD-10-CM | POA: Diagnosis not present

## 2020-01-25 NOTE — Progress Notes (Signed)
Hematology/Oncology follow up note [facility] [phone]) [phone] Fax:([phone]) [phone]   Patient Care Team: Guadalupe Maple, MD as PCP - General (Family Medicine) Gabriel Carina, Betsey Holiday, MD as Physician Assistant (Endocrinology) Kem Parkinson, MD (Ophthalmology) Telford Nab, RN as Registered Nurse Tamala Julian, Jonette Eva, NP as Nurse Practitioner Noreene Filbert, MD as Radiation Oncologist (Radiation Oncology)  REFERRING PROVIDER: Guadalupe Maple, MD  CHIEF COMPLAINTS/REASON FOR VISIT:  Follow up for non-small cell lung cancer  HISTORY OF PRESENTING ILLNESS:  Colin Novak is a  74 y.o.  male with PMH listed below was seen in consultation at the request of  Guadalupe Maple, MD  for evaluation of lung mass Patient reports ongoing shortness of breath, cough for  few months. He had CT chest scan done on 06/01/2019 which showed a concerning feature of 9.1 x 5.1 x 3.6 cm right upper lobe mass extending into mediastinum, encasing and occluding right upper lobe bronchus and some of the right upper lobe bronchial branches.  There is also significant narrowing of FVC. He also had a similar appearance of left upper lobe mass without mediastinal extension suspicious for a synchronized primary lung cancer Multiple additional smaller bilateral lung nodules, left greater than right. Mildly enlarged right hilar and mediastinal lymph nodes suspicious for metastatic lymphadenopathy. 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 Daughter [name] is his power of attorney. Denies fever or chills.  # patient was seen by me on 06/20/2019.  At that time, patient has 2 separate lung tumor.  Possibility of stage IV lung adenocarcinoma TX N1 M1 discussed with patient given that he has tumor in the contralateral lobe.  Systemic chemotherapy with immunotherapy was recommended. He may have 2  separate primary lung cancer, if that is the case, he has stage III lung cancer.  would benefit from definitive concurrent chemoradiation followed by immunotherapy for maintenance. Patient has decided at that time that he does not want any systemic chemotherapy.  He underwent SBRT to right upper lobe lesion.  Repeat CT scan showed interval increase of the right para mediastinal and hilar mass and also multiple areas of increased density in the left lower lobe consistent with multifocal adenocarcinoma/metastasis.   INTERVAL HISTORY Colin Novak is a 74 y.o. male who has above history reviewed by me today presents for follow up visit for management of  non-small cell lung cancer. Problems and complaints are listed below: During interval patient had PET scan done and presents to discuss plans. He actually has felt better after empiric vitamin B12 injection at the last visit preparing him for Al[place] treatments. Continue to have chronic cough and shortness of breath. He has gained 2 pounds since last visit.  Review of Systems  Constitutional: Positive for fatigue and unexpected weight change. Negative for appetite change, chills, diaphoresis and fever.  HENT:   Negative for hearing loss, lump/mass, nosebleeds, sore throat and voice change.   Eyes: Negative for eye problems and icterus.  Respiratory: Positive for cough and shortness of breath. Negative for chest tightness, hemoptysis and wheezing.   Cardiovascular: Negative for chest pain and leg swelling.  Gastrointestinal: Negative for abdominal distention, abdominal pain, blood in stool, diarrhea, nausea and rectal pain.  Endocrine: Negative for hot flashes.  Genitourinary: Negative for bladder incontinence, difficulty urinating, dysuria, frequency, hematuria and nocturia.   Musculoskeletal: Positive for back pain. Negative for arthralgias, flank pain, gait problem and myalgias.  Skin: Negative for itching and rash.  Neurological: Negative for  dizziness, gait problem, headaches, light-headedness, numbness and seizures.  Hematological: Negative for adenopathy. Does not bruise/bleed easily.  Psychiatric/Behavioral: Negative for confusion and decreased concentration. The patient is not nervous/anxious.     MEDICAL HISTORY:  Past Medical History:  Diagnosis Date  . Anxiety   . COPD (chronic obstructive pulmonary disease) ([place])   . Diabetes mellitus without complication ([place])    type 2  . GERD (gastroesophageal reflux disease)   . Hyperlipidemia     SURGICAL HISTORY: Past Surgical History:  Procedure Laterality Date  . APPENDECTOMY    . ENDOBRONCHIAL ULTRASOUND Bilateral 06/13/2019   Procedure: ENDOBRONCHIAL ULTRASOUND, BILATERAL;  Surgeon: [name], MD;  Location: [facility] ORS;  Service: Pulmonary;  Laterality: Bilateral;  . EYE SURGERY Bilateral    cataract    SOCIAL HISTORY: Social History   Socioeconomic History  . Marital status: Widowed    Spouse name: Not on file  . Number of children: 1  . Years of education: Not on file  . Highest education level: High school graduate  Occupational History  . Occupation: retired  Tobacco Use  . Smoking status: Current Every Day Smoker    Packs/day: 1.00    Types: Cigarettes  . Smokeless tobacco: Former Systems developer    Types: Chew    Quit date: 06/10/1959  . Tobacco comment: 1 pack daily- 06/09/2019  Substance and Sexual Activity  . Alcohol use: No  . Drug use: No  . Sexual activity: Not Currently  Other Topics Concern  . Not on file  Social History Narrative  . Not on file   Social Determinants of Health   Financial Resource Strain:   . Difficulty of Paying Living Expenses:   Food Insecurity:   . Worried About Charity fundraiser in the Last Year:   . Arboriculturist in the Last Year:   Transportation Needs:   . Film/video editor (Medical):   Marland Kitchen Lack of Transportation (Non-Medical):   Physical Activity:   . Days of Exercise per Week:   . Minutes of  Exercise per Session:   Stress:   . Feeling of Stress :   Social Connections:   . Frequency of Communication with Friends and Family:   . Frequency of Social Gatherings with Friends and Family:   . Attends Religious Services:   . Active Member of Clubs or Organizations:   . Attends Archivist Meetings:   Marland Kitchen Marital Status:   Intimate Partner Violence:   . Fear of Current or Ex-Partner:   . Emotionally Abused:   Marland Kitchen Physically Abused:   . Sexually Abused:     FAMILY HISTORY: Family History  Problem Relation Age of Onset  . Cancer Mother        lung (non smoker)  . Diabetes Father   . Hypertension Father   . Lymphoma Brother   . Prostate cancer Brother     ALLERGIES:  is allergic to penicillins.  MEDICATIONS:  Current Outpatient Medications  Medication Sig Dispense Refill  . albuterol (PROAIR HFA) 108 (90 Base) MCG/ACT inhaler Inhale 1-2 puffs into the lungs every 6 (six) hours as needed for wheezing or shortness of breath (use as needed for chest congestion.). 1 g 3  . aspirin EC 81 MG tablet Take 81 mg by mouth daily.    . citalopram (CELEXA) 20 MG tablet Take 1 tablet (20 mg total) by mouth daily. 90 tablet 4  .  Dextromethorphan-guaiFENesin (ROBITUSSIN DM PO) Take 5 mLs by mouth at bedtime.     Marland Kitchen ezetimibe-simvastatin (VYTORIN) 10-40 MG tablet Take 1 tablet by mouth daily. 90 tablet 4  . Fluticasone-Umeclidin-Vilant (TRELEGY ELLIPTA) 100-62.5-25 MCG/INH AEPB Inhale 1 applicator into the lungs daily. Rinse mouth after use. 1 each 10  . glucose blood (ONE TOUCH ULTRA TEST) test strip 1 each by Other route 2 (two) times daily as needed for other. Dx: E11.9 100 each 12  . HUMALOG KWIKPEN 100 UNIT/ML KwikPen 16 Units. Sliding scale up to 12 units    . LANTUS SOLOSTAR 100 UNIT/ML Solostar Pen INJECT 10-20 UNITS INTO THE SKIN DAILY 5 pen 12  . metFORMIN (GLUCOPHAGE) 500 MG tablet TAKE ONE TABLET BY MOUTH TWICE DAILY 60 tablet 12  . Multiple Vitamin (MULTIVITAMIN) tablet  Take 1 tablet by mouth daily.    Marland Kitchen NOVOFINE 32G X 6 MM MISC USE AS DIRECTED. 5 INJECTIONS A DAY 900 each 0  . omeprazole (PRILOSEC) 20 MG capsule Take 1 capsule (20 mg total) by mouth daily. 30 capsule 11  . oxymetazoline (AFRIN) 0.05 % nasal spray Place 2 sprays into both nostrils 2 (two) times daily as needed.     . pioglitazone (ACTOS) 30 MG tablet Take 30 mg by mouth daily.   1  . predniSONE (DELTASONE) 20 MG tablet Take 20 mg by mouth daily with breakfast.    . pseudoephedrine-acetaminophen (TYLENOL SINUS) 30-500 MG TABS tablet Take 2 tablets by mouth every 4 (four) hours as needed. Only using twice daily    . triamcinolone (NASACORT) 55 MCG/ACT AERO nasal inhaler Place 1 spray into the nose 2 (two) times daily. 1 Inhaler 2  . insulin aspart (NOVOLOG) 100 UNIT/ML injection Inject into the skin 3 (three) times daily before meals. Sliding scale     No current facility-administered medications for this visit.     PHYSICAL EXAMINATION: ECOG PERFORMANCE STATUS: 2 - Symptomatic, <50% confined to bed Vitals:   01/25/20 1052  BP: 103/63  Pulse: 81  Resp: 18  Temp: (!) 96.6 F (35.9 C)  SpO2: 97%   Filed Weights   01/25/20 1052  Weight: 117 lb (53.1 kg)    Physical Exam Constitutional:      General: He is not in acute distress.    Appearance: He is ill-appearing.     Comments: Cachectic  HENT:     Head: Normocephalic and atraumatic.  Eyes:     General: No scleral icterus.    Pupils: Pupils are equal, round, and reactive to light.  Neck:     Comments: No neck swelling Cardiovascular:     Rate and Rhythm: Normal rate and regular rhythm.     Heart sounds: Normal heart sounds.  Pulmonary:     Effort: Pulmonary effort is normal. No respiratory distress.     Breath sounds: No wheezing.     Comments: Decreased breath sounds bilaterally.  Abdominal:     General: Bowel sounds are normal. There is no distension.     Palpations: Abdomen is soft. There is no mass.    Musculoskeletal:        General: No deformity. Normal range of motion.     Cervical back: Normal range of motion and neck supple.     Comments: Clubbing  Skin:    General: Skin is warm and dry.     Findings: No erythema or rash.  Neurological:     General: No focal deficit present.     Mental Status:  He is alert and oriented to person, place, and time. Mental status is at baseline.     Cranial Nerves: No cranial nerve deficit.     Coordination: Coordination normal.  Psychiatric:        Mood and Affect: Mood normal.     LABORATORY DATA:  I have reviewed the data as listed Lab Results  Component Value Date   WBC 4.6 01/16/2020   HGB 16.3 01/16/2020   HCT 46.4 01/16/2020   MCV 95.1 01/16/2020   PLT 247 01/16/2020   Recent Labs    06/07/19 1157 06/07/19 1157 09/02/19 0932 01/06/20 1417 01/16/20 0942  NA 132*  --  135  --  132*  K 4.6  --  4.8  --  4.9  CL 95*  --  101  --  98  CO2 27  --  26  --  24  GLUCOSE 416*  --  149*  --  460*  BUN 15  --  14  --  13  CREATININE 0.73   < > 0.65 0.60* 0.80  CALCIUM 9.3  --  9.1  --  9.3  GFRNONAA >60  --  >60  --  >60  GFRAA >60  --  >60  --  >60  PROT 7.3  --  7.3  --  7.7  ALBUMIN 4.3  --  4.1  --  4.4  AST 26  --  26  --  30  ALT 37  --  26  --  33  ALKPHOS 77  --  80  --  110  BILITOT 0.7  --  0.7  --  0.9   < > = values in this interval not displayed.   Iron/TIBC/Ferritin/ %Sat No results found for: IRON, TIBC, FERRITIN, IRONPCTSAT    RADIOGRAPHIC STUDIES: I have personally reviewed the radiological images as listed and agreed with the findings in the report.  CT Chest W Contrast  Result Date: 01/06/2020 CLINICAL DATA:  Non-small cell lung cancer, follow-up, recent completion of radiotherapy over 4 week. For stage III non-small lung cancer. EXAM: CT CHEST WITH CONTRAST TECHNIQUE: Multidetector CT imaging of the chest was performed during intravenous contrast administration. CONTRAST:  38mL OMNIPAQUE IOHEXOL 300  MG/ML  SOLN COMPARISON:  06/01/2019 FINDINGS: Cardiovascular: Dilation of the ascending thoracic aorta. Also with dilation of the descending thoracic aorta 3.5 cm. Ascending thoracic aorta at 4 cm within 1 cm prior measurement. Heart size is normal without pericardial effusion. Distortion of right hilar structures including right pulmonary artery secondary to right hilar mass. Central pulmonary arteries are otherwise unremarkable. Mediastinum/Nodes: No evidence of thoracic inlet adenopathy. No axillary lymphadenopathy. Right paramediastinal and hilar mass measuring 5.0 x 2.6 cm previously approximately 5.3 cm in greatest AP dimension and 3.5 cm in greatest thickness. No signs of mediastinal lymphadenopathy. Previously enlarged lymph nodes show though size less than a cm on the current exam. Lungs/Pleura: Severe pulmonary emphysema worse at the lung apices. Left upper lobe nodule measuring 2.5 x 1.9 cm previously approximately 2.4 cm in greatest dimension when measured in a similar fashion. Left lower lobe nodule measuring 1.7 by select 1.7 x 1.5 x 1.1 cm previously 1.4 x 1 cm. Other nodules in the left lower lobe have increased slightly in size in a similar fashion, for instance a nodule along the major fissure in the superior segment of the left lower lobe measures 8 mm on today's study previously approximately 6 mm. Increasing size and density within another left lower  lobe nodule (image 119, series 3) 10 mm on today's study, previously 7 mm. Tiny nodules scattered about the left lower lobe despite increasing conspicuity and slight increase in size are otherwise unchanged. Airways are patent. Upper Abdomen: Is adrenal glands are normal. No acute process visualized in the upper abdomen. Musculoskeletal: Spinal degenerative changes. No acute or destructive bone process. IMPRESSION: 1. Slight interval increase in size of the right paramediastinal and hilar mass. 2. Interval increase in size and density of left lower  lobe pulmonary nodules. With slight increase in size of dominant left upper lobe nodule. These remain concerning for multifocal adenocarcinoma/metastasis. 3. Interval decrease in size of mediastinal lymph nodes. 4. Severe pulmonary emphysema. 5. Dilation of the ascending thoracic aorta and descending thoracic aorta. Recommend annual imaging followup by CTA or MRA. This recommendation follows 2010 ACCF/AHA/AATS/ACR/ASA/SCA/SCAI/SIR/STS/SVM Guidelines for the Diagnosis and Management of Patients with Thoracic Aortic Disease. Circulation. 2010; 121: W299-B716. Aortic aneurysm NOS (ICD10-I71.9) Emphysema (ICD10-J43.9). Electronically Signed   By: Zetta Bills M.D.   On: 01/06/2020 15:31   MR Brain W Wo Contrast  Result Date: 01/24/2020 CLINICAL DATA:  Non small cell lung cancer. Staging. EXAM: MRI HEAD WITHOUT AND WITH CONTRAST TECHNIQUE: Multiplanar, multiecho pulse sequences of the brain and surrounding structures were obtained without and with intravenous contrast. CONTRAST:  78mL GADAVIST GADOBUTROL 1 MMOL/ML IV SOLN COMPARISON:  MRI of the brain June 17, 2019. FINDINGS: Brain: No acute infarction, hemorrhage, hydrocephalus, extra-axial collection or mass lesion. 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 Scattered foci of T2 hyperintensity are seen within the white matter of the cerebral hemispheres and within the pons, nonspecific, most likely related to chronic small vessel ischemia. There is prominence of the supratentorial ventricles and cerebral sulci reflecting parenchymal volume loss. No focus of abnormal contrast enhancement seen to suggest metastatic disease to the brain. Vascular: Normal flow voids. Skull and upper cervical spine: Stable benign-appearing left parietal bone lesion. Otherwise, normal marrow signal. Sinuses/Orbits: Bilateral lens surgery. Paranasal sinuses are clear. IMPRESSION: 1. No  evidence of intracranial metastatic disease. 2. 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 3. Moderate chronic small vessel ischemia and parenchymal volume loss. Electronically Signed   By: Pedro Earls M.D.   On: 01/24/2020 10:15   NM PET Image Restag (PS) Skull Base To Thigh  Result Date: 01/23/2020 CLINICAL DATA:  Subsequent treatment strategy for non-small cell lung cancer. EXAM: NUCLEAR MEDICINE PET SKULL BASE TO THIGH TECHNIQUE: 6.3 mCi F-18 FDG was injected intravenously. Full-ring PET imaging was performed from the skull base to thigh after the radiotracer. CT data was obtained and used for attenuation correction and anatomic localization. Fasting blood glucose: 250 mg/dl COMPARISON:  Multiple exams, including 06/15/2019 and CT chest from 01/06/2020 FINDINGS: Mediastinal blood pool activity: SUV max 2.0 Liver activity: SUV max NA NECK: No significant abnormal hypermetabolic activity in this region. Incidental CT findings: Bilateral common carotid atherosclerotic calcification. CHEST: The dominant left upper lobe pulmonary nodule measures 2.7 by 2.5 cm on image 75/3 (previously 3.0 by 2.2 cm prior PET-CT of 06/15/2019) with maximum SUV 3.7 (formerly 6.4), compatible with reduction in activity in this nodule. The right upper lobe suprahilar nodule causing occlusion of the right upper lobe bronchus is difficult to measure due to adjacent presumed atelectatic lung but is felt to have a transverse thickness of about 2.6  cm on 84/3 (formerly 3.9 cm) with maximum SUV 2.3 (formerly 7.1). This represents a significant improvement but remains slightly above the blood pool in activity. As noted on recent chest CT there are some left lower lobe nodules which have enlarged in size compared to 06/15/2019. One of the largest is a 1.9 by 1.4 cm nodule on image 102/3 (previously 1.4 by 1.0 cm on 06/15/2019) with maximum  SUV 1.0 (formerly 1.1). The other smaller left pulmonary nodules have little in the way of accentuated metabolic activity, very low-grade. Incidental CT findings: Emphysema. Volume loss in the right upper lobe specially medially with occluded right upper lobe bronchus. Bilateral airway thickening. Coronary, aortic arch, and branch vessel atherosclerotic vascular disease. ABDOMEN/PELVIS: No significant abnormal hypermetabolic activity in this region. Incidental CT findings: Aortoiliac atherosclerotic vascular disease. Prominent stool throughout the colon favors constipation. Prostatomegaly. SKELETON: Sclerosis in the upper sternal body is new compared to prior PET-CT of 06/15/2019 and has some associated central lucency. Maximum SUV is 1.8, previously 1.3. Possibilities may include healing fracture, osteonecrosis, or an unusual metastatic lesion given the lack of accentuated metabolic activity. Incidental CT findings: Old healed right clavicular fracture. Prior wedge compression at L2. Prior wedge compression at T2. Accentuated sclerosis along the upper sternal body is new compared to the prior PET-CT of 06/15/2019 IMPRESSION: 1. Notably reduced size and activity of the dominant left upper lobe and dominant right upper lobe lesions. There is still occlusion of the right upper lobe bronchus with some associated atelectasis. 2. As noted on recent chest CT, some of the left lung nodules have enlarged. However, these have not increased in metabolic activity and remain well below blood pool. Surveillance is likely warranted. 3. New sclerosis in the upper sternal body compared to prior PET-CT. Possibilities include healing fracture, osteonecrosis (query interval radiation therapy), or an unusual metastatic lesion. The low metabolic activity argues against a metastatic lesion. Surveillance is suggested. 4. Aortic Atherosclerosis (ICD10-I70.0) and Emphysema (ICD10-J43.9). Coronary atherosclerosis. Airway thickening is  present, suggesting bronchitis or reactive airways disease. Prominent stool throughout the colon favors constipation. Prostatomegaly. Subacute superior endplate compression at T2. Old wedge compression fracture at L2. Electronically Signed   By: Van Clines M.D.   On: 01/23/2020 12:14      ASSESSMENT & PLAN:  1. Malignant neoplasm of lung, unspecified laterality, unspecified part of lung ([place])   2. Goals of care, counseling/discussion   3. Uncontrolled type 2 diabetes mellitus with hyperglycemia ([facility])    #Non small cell lung carcinoma, status post SBRT to right upper lobe lung cancer. PET scan was reviewed and discussed with patient and his daughter. PET scan showed notably reduced size and activity of the dominant left upper lobe and dominant right upper lobe lesions. There is still occlusion of the right upper lobe bronchus with some associated atelectasis. Enlarged left lung nodules have not had increased metabolic activity and remained well below blood pool. New sclerosis in the upper sternal body comparing to previous PET scan. Aortic atherosclerosis and emphysema.  Discussed with patient that PET scan it is reassuring that left lung nodules have very low activity, likely benign although low-grade adenocarcinoma cannot be excluded. H is right upper and left upper lobe lung masses seem to  have a good response to radiation. With the uncertainty of his original staging, stage IV versus stage III lung disease, lack of chemotherapy concurrently with radiation.  Patient has high recurrence rate. We discussed about the option of starting maintenance immunotherapy which will be  a standard treatment option after radiation in stage III lung cancer patients, certainly an option in stage IV disease.  Discussed about the rationale and potential side effects  patient is reluctant in starting any treatment at this point. He is also not interested in aggressive treatments therefore I recommend  repeat CT scan in 6 months interval.  He agrees with the plan.  If he changes his mind regarding immunotherapy, he will call cancer center and update me.  #Hyperglycemia, uncontrolled diabetes.  Last A1c was done on 12/06/2018 with A1c of 9.6. Advised patient to follow-up with primary care provider, follow diabetic diet Follow-up in 6 months Orders Placed This Encounter  Procedures  . CT Chest W Contrast    Standing Status:   Future    Standing Expiration Date:   01/24/2021    Order Specific Question:   ** REASON FOR EXAM (FREE TEXT)    Answer:   lung cancer follow up    Order Specific Question:   If indicated for the ordered procedure, I authorize the administration of contrast media per Radiology protocol    Answer:   Yes    Order Specific Question:   Preferred imaging location?    Answer:   [facility]    Order Specific Question:   Radiology Contrast Protocol - do NOT remove file path    Answer:   [email or web address]  . CBC with Differential/Platelet    Standing Status:   Future    Standing Expiration Date:   01/24/2021  . Comprehensive metabolic panel    Standing Status:   Future    Standing Expiration Date:   01/24/2021    All questions were answered. The patient knows to call the clinic with any problems questions or concerns.  Return of visit: 6 months Earlie Server, MD, PhD Hematology Oncology [facility] Pager- [phone] 01/25/2020

## 2020-01-25 NOTE — Progress Notes (Signed)
Patient here for follow up. No concerns voiced today.

## 2020-01-25 NOTE — Progress Notes (Signed)
Colin Novak  [phone] [phone]   Name: Colin Novak Date: 01/25/2020 MRN: [id number]  DOB: [birth year]  Patient Care Team: Guadalupe Maple, MD as PCP - General (Family Medicine) Gabriel Carina, Betsey Holiday, MD as Physician Assistant (Endocrinology) Kem Parkinson, MD (Ophthalmology) Telford Nab, RN as Registered Nurse Tamala Julian, Jonette Eva, NP as Nurse Practitioner Noreene Filbert, MD as Radiation Oncologist (Radiation Oncology)    REASON FOR CONSULTATION: Palliative Care consult requested for this 74 y.o. male with multiple medical problems including stage III-IV non-small cell lung cancer who was planned to start systemic chemotherapy plus/minus immunotherapy with possible palliative radiation to the right upper lobe as mass is obstructing his bronchus.  Patient was offered chemotherapy but declined. He did decide to pursue RT.  There is been discussion of hospice involvement once RT is completed.  Patient was referred to palliative care to help address goals and manage ongoing symptoms..    SOCIAL HISTORY:     reports that he has been smoking cigarettes. He has been smoking about 1.00 pack per day. He quit smokeless tobacco use about 60 years ago.  His smokeless tobacco use included chew. He reports that he does not drink alcohol or use drugs.   Patient is a widower.  He lives at home alone with his dog.  He has a daughter who is involved in his care.  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  ADVANCE DIRECTIVES:  Not on file  CODE STATUS: DNR (DNR form signed on 09/01/2019)  PAST MEDICAL HISTORY: Past Medical History:  Diagnosis Date  . Anxiety   . COPD (chronic obstructive pulmonary disease) ([place])   . Diabetes mellitus without complication ([place])    type 2  . GERD (gastroesophageal reflux disease)   . Hyperlipidemia     PAST SURGICAL HISTORY:  Past Surgical History:  Procedure  Laterality Date  . APPENDECTOMY    . ENDOBRONCHIAL ULTRASOUND Bilateral 06/13/2019   Procedure: ENDOBRONCHIAL ULTRASOUND, BILATERAL;  Surgeon: [name], MD;  Location: [facility] ORS;  Service: Pulmonary;  Laterality: Bilateral;  . EYE SURGERY Bilateral    cataract    HEMATOLOGY/ONCOLOGY HISTORY:  Oncology History  Non-small cell lung cancer ([place])  06/20/2019 Initial Diagnosis   Non-small cell lung cancer ([place])   01/16/2020 -  Chemotherapy   The patient had palonosetron (ALOXI) injection 0.25 mg, 0.25 mg, Intravenous,  Once, 0 of 4 cycles PEMEtrexed (ALIMTA) 800 mg in sodium chloride 0.9 % 100 mL chemo infusion, 500 mg/m2, Intravenous,  Once, 0 of 4 cycles CARBOplatin (PARAPLATIN) in sodium chloride 0.9 % 100 mL chemo infusion, , Intravenous,  Once, 0 of 4 cycles fosaprepitant (EMEND) 150 mg in sodium chloride 0.9 % 145 mL IVPB, 150 mg, Intravenous,  Once, 0 of 4 cycles pembrolizumab (KEYTRUDA) 200 mg in sodium chloride 0.9 % 50 mL chemo infusion, 200 mg (original dose ), Intravenous, Once, 0 of 4 cycles Dose modification: 200 mg (Cycle 1, Reason: Other (see comments))  for chemotherapy treatment.    01/23/2020 - 01/23/2020 Chemotherapy   The patient had palonosetron (ALOXI) injection 0.25 mg, 0.25 mg, Intravenous,  Once, 0 of 4 cycles PEMEtrexed (ALIMTA) 775 mg in sodium chloride 0.9 % 100 mL chemo infusion, 500 mg/m2, Intravenous,  Once, 0 of 4 cycles CARBOplatin (PARAPLATIN) in sodium chloride 0.9 % 100 mL chemo infusion, , Intravenous,  Once, 0 of 4 cycles fosaprepitant (EMEND) 150 mg, dexamethasone (DECADRON) 12 mg in sodium chloride  0.9 % 145 mL IVPB, , Intravenous,  Once, 0 of 4 cycles  for chemotherapy treatment.      ALLERGIES:  is allergic to penicillins.  MEDICATIONS:  Current Outpatient Medications  Medication Sig Dispense Refill  . albuterol (PROAIR HFA) 108 (90 Base) MCG/ACT inhaler Inhale 1-2 puffs into the lungs every 6 (six) hours as needed for wheezing or  shortness of breath (use as needed for chest congestion.). 1 g 3  . aspirin EC 81 MG tablet Take 81 mg by mouth daily.    . citalopram (CELEXA) 20 MG tablet Take 1 tablet (20 mg total) by mouth daily. 90 tablet 4  . Dextromethorphan-guaiFENesin (ROBITUSSIN DM PO) Take 5 mLs by mouth at bedtime.     Marland Kitchen ezetimibe-simvastatin (VYTORIN) 10-40 MG tablet Take 1 tablet by mouth daily. 90 tablet 4  . Fluticasone-Umeclidin-Vilant (TRELEGY ELLIPTA) 100-62.5-25 MCG/INH AEPB Inhale 1 applicator into the lungs daily. Rinse mouth after use. 1 each 10  . glucose blood (ONE TOUCH ULTRA TEST) test strip 1 each by Other route 2 (two) times daily as needed for other. Dx: E11.9 (Patient not taking: Reported on 01/13/2020) 100 each 12  . HUMALOG KWIKPEN 100 UNIT/ML KwikPen 16 Units. Sliding scale up to 12 units    . insulin aspart (NOVOLOG) 100 UNIT/ML injection Inject into the skin 3 (three) times daily before meals. Sliding scale    . LANTUS SOLOSTAR 100 UNIT/ML Solostar Pen INJECT 10-20 UNITS INTO THE SKIN DAILY 5 pen 12  . metFORMIN (GLUCOPHAGE) 500 MG tablet TAKE ONE TABLET BY MOUTH TWICE DAILY 60 tablet 12  . Multiple Vitamin (MULTIVITAMIN) tablet Take 1 tablet by mouth daily.    Marland Kitchen NOVOFINE 32G X 6 MM MISC USE AS DIRECTED. 5 INJECTIONS A DAY 900 each 0  . omeprazole (PRILOSEC) 20 MG capsule Take 1 capsule (20 mg total) by mouth daily. 30 capsule 11  . oxymetazoline (AFRIN) 0.05 % nasal spray Place 2 sprays into both nostrils 2 (two) times daily as needed.     . pioglitazone (ACTOS) 30 MG tablet Take 30 mg by mouth daily.   1  . predniSONE (DELTASONE) 20 MG tablet Take 20 mg by mouth daily with breakfast.    . pseudoephedrine-acetaminophen (TYLENOL SINUS) 30-500 MG TABS tablet Take 2 tablets by mouth every 4 (four) hours as needed. Only using twice daily    . triamcinolone (NASACORT) 55 MCG/ACT AERO nasal inhaler Place 1 spray into the nose 2 (two) times daily. (Patient not taking: Reported on 01/13/2020) 1 Inhaler 2    No current facility-administered medications for this visit.    VITAL SIGNS: There were no vitals taken for this visit. There were no vitals filed for this visit.  Estimated body mass index is 17.34 kg/m as calculated from the following:   Height as of 06/13/19: 5\' 9"  (1.753 m).   Weight as of an earlier encounter on 01/25/20: 117 lb 6.4 oz (53.3 kg).  LABS: CBC:    Component Value Date/Time   WBC 4.6 01/16/2020 0942   HGB 16.3 01/16/2020 0942   HGB 13.2 12/06/2018 0857   HCT 46.4 01/16/2020 0942   HCT 38.6 12/06/2018 0857   PLT 247 01/16/2020 0942   PLT 266 12/06/2018 0857   MCV 95.1 01/16/2020 0942   MCV 96 12/06/2018 0857   NEUTROABS 3.1 01/16/2020 0942   NEUTROABS 1.7 12/06/2018 0857   LYMPHSABS 0.8 01/16/2020 0942   LYMPHSABS 1.6 12/06/2018 0857   MONOABS 0.6 01/16/2020 0942   EOSABS  0.0 01/16/2020 0942   EOSABS 0.1 12/06/2018 0857   BASOSABS 0.1 01/16/2020 0942   BASOSABS 0.1 12/06/2018 0857   Comprehensive Metabolic Panel:    Component Value Date/Time   NA 132 (L) 01/16/2020 0942   NA 140 12/06/2018 0857   K 4.9 01/16/2020 0942   CL 98 01/16/2020 0942   CO2 24 01/16/2020 0942   BUN 13 01/16/2020 0942   BUN 9 12/06/2018 0857   CREATININE 0.80 01/16/2020 0942   GLUCOSE 460 (H) 01/16/2020 0942   CALCIUM 9.3 01/16/2020 0942   AST 30 01/16/2020 0942   AST 55 (H) 04/01/2017 0907   ALT 33 01/16/2020 0942   ALT 48 (H) 04/01/2017 0907   ALKPHOS 110 01/16/2020 0942   BILITOT 0.9 01/16/2020 0942   BILITOT <0.2 12/06/2018 0857   PROT 7.7 01/16/2020 0942   PROT 6.4 12/06/2018 0857   ALBUMIN 4.4 01/16/2020 0942   ALBUMIN 4.1 12/06/2018 0857    RADIOGRAPHIC STUDIES: CT Chest W Contrast  Result Date: 01/06/2020 CLINICAL DATA:  Non-small cell lung cancer, follow-up, recent completion of radiotherapy over 4 week. For stage III non-small lung cancer. EXAM: CT CHEST WITH CONTRAST TECHNIQUE: Multidetector CT imaging of the chest was performed during intravenous  contrast administration. CONTRAST:  38mL OMNIPAQUE IOHEXOL 300 MG/ML  SOLN COMPARISON:  06/01/2019 FINDINGS: Cardiovascular: Dilation of the ascending thoracic aorta. Also with dilation of the descending thoracic aorta 3.5 cm. Ascending thoracic aorta at 4 cm within 1 cm prior measurement. Heart size is normal without pericardial effusion. Distortion of right hilar structures including right pulmonary artery secondary to right hilar mass. Central pulmonary arteries are otherwise unremarkable. Mediastinum/Nodes: No evidence of thoracic inlet adenopathy. No axillary lymphadenopathy. Right paramediastinal and hilar mass measuring 5.0 x 2.6 cm previously approximately 5.3 cm in greatest AP dimension and 3.5 cm in greatest thickness. No signs of mediastinal lymphadenopathy. Previously enlarged lymph nodes show though size less than a cm on the current exam. Lungs/Pleura: Severe pulmonary emphysema worse at the lung apices. Left upper lobe nodule measuring 2.5 x 1.9 cm previously approximately 2.4 cm in greatest dimension when measured in a similar fashion. Left lower lobe nodule measuring 1.7 by select 1.7 x 1.5 x 1.1 cm previously 1.4 x 1 cm. Other nodules in the left lower lobe have increased slightly in size in a similar fashion, for instance a nodule along the major fissure in the superior segment of the left lower lobe measures 8 mm on today's study previously approximately 6 mm. Increasing size and density within another left lower lobe nodule (image 119, series 3) 10 mm on today's study, previously 7 mm. Tiny nodules scattered about the left lower lobe despite increasing conspicuity and slight increase in size are otherwise unchanged. Airways are patent. Upper Abdomen: Is adrenal glands are normal. No acute process visualized in the upper abdomen. Musculoskeletal: Spinal degenerative changes. No acute or destructive bone process. IMPRESSION: 1. Slight interval increase in size of the right paramediastinal and  hilar mass. 2. Interval increase in size and density of left lower lobe pulmonary nodules. With slight increase in size of dominant left upper lobe nodule. These remain concerning for multifocal adenocarcinoma/metastasis. 3. Interval decrease in size of mediastinal lymph nodes. 4. Severe pulmonary emphysema. 5. Dilation of the ascending thoracic aorta and descending thoracic aorta. Recommend annual imaging followup by CTA or MRA. This recommendation follows 2010 ACCF/AHA/AATS/ACR/ASA/SCA/SCAI/SIR/STS/SVM Guidelines for the Diagnosis and Management of Patients with Thoracic Aortic Disease. Circulation. 2010; 121: Z124-P809. Aortic aneurysm NOS (  ICD10-I71.9) Emphysema (ICD10-J43.9). Electronically Signed   By: Zetta Bills M.D.   On: 01/06/2020 15:31   MR Brain W Wo Contrast  Result Date: 01/24/2020 CLINICAL DATA:  Non small cell lung cancer. Staging. EXAM: MRI HEAD WITHOUT AND WITH CONTRAST TECHNIQUE: Multiplanar, multiecho pulse sequences of the brain and surrounding structures were obtained without and with intravenous contrast. CONTRAST:  7mL GADAVIST GADOBUTROL 1 MMOL/ML IV SOLN COMPARISON:  MRI of the brain June 17, 2019. FINDINGS: Brain: No acute infarction, hemorrhage, hydrocephalus, extra-axial collection or mass lesion. 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 Scattered foci of T2 hyperintensity are seen within the white matter of the cerebral hemispheres and within the pons, nonspecific, most likely related to chronic small vessel ischemia. There is prominence of the supratentorial ventricles and cerebral sulci reflecting parenchymal volume loss. No focus of abnormal contrast enhancement seen to suggest metastatic disease to the brain. Vascular: Normal flow voids. Skull and upper cervical spine: Stable benign-appearing left parietal bone lesion. Otherwise, normal marrow signal. Sinuses/Orbits: Bilateral  lens surgery. Paranasal sinuses are clear. IMPRESSION: 1. No evidence of intracranial metastatic disease. 2. 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 3. Moderate chronic small vessel ischemia and parenchymal volume loss. Electronically Signed   By: Pedro Earls M.D.   On: 01/24/2020 10:15   NM PET Image Restag (PS) Skull Base To Thigh  Result Date: 01/23/2020 CLINICAL DATA:  Subsequent treatment strategy for non-small cell lung cancer. EXAM: NUCLEAR MEDICINE PET SKULL BASE TO THIGH TECHNIQUE: 6.3 mCi F-18 FDG was injected intravenously. Full-ring PET imaging was performed from the skull base to thigh after the radiotracer. CT data was obtained and used for attenuation correction and anatomic localization. Fasting blood glucose: 250 mg/dl COMPARISON:  Multiple exams, including 06/15/2019 and CT chest from 01/06/2020 FINDINGS: Mediastinal blood pool activity: SUV max 2.0 Liver activity: SUV max NA NECK: No significant abnormal hypermetabolic activity in this region. Incidental CT findings: Bilateral common carotid atherosclerotic calcification. CHEST: The dominant left upper lobe pulmonary nodule measures 2.7 by 2.5 cm on image 75/3 (previously 3.0 by 2.2 cm prior PET-CT of 06/15/2019) with maximum SUV 3.7 (formerly 6.4), compatible with reduction in activity in this nodule. The right upper lobe suprahilar nodule causing occlusion of the right upper lobe bronchus is difficult to measure due to adjacent presumed atelectatic lung but is felt to have a transverse thickness of about 2.6 cm on 84/3 (formerly 3.9 cm) with maximum SUV 2.3 (formerly 7.1). This represents a significant improvement but remains slightly above the blood pool in activity. As noted on recent chest CT there are some left lower lobe nodules which have enlarged in size compared to 06/15/2019. One of the largest is a 1.9 by 1.4 cm nodule on image  102/3 (previously 1.4 by 1.0 cm on 06/15/2019) with maximum SUV 1.0 (formerly 1.1). The other smaller left pulmonary nodules have little in the way of accentuated metabolic activity, very low-grade. Incidental CT findings: Emphysema. Volume loss in the right upper lobe specially medially with occluded right upper lobe bronchus. Bilateral airway thickening. Coronary, aortic arch, and branch vessel atherosclerotic vascular disease. ABDOMEN/PELVIS: No significant abnormal hypermetabolic activity in this region. Incidental CT findings: Aortoiliac atherosclerotic vascular disease. Prominent stool throughout the colon favors constipation. Prostatomegaly. SKELETON: Sclerosis in the upper sternal body is new compared to prior PET-CT of 06/15/2019  and has some associated central lucency. Maximum SUV is 1.8, previously 1.3. Possibilities may include healing fracture, osteonecrosis, or an unusual metastatic lesion given the lack of accentuated metabolic activity. Incidental CT findings: Old healed right clavicular fracture. Prior wedge compression at L2. Prior wedge compression at T2. Accentuated sclerosis along the upper sternal body is new compared to the prior PET-CT of 06/15/2019 IMPRESSION: 1. Notably reduced size and activity of the dominant left upper lobe and dominant right upper lobe lesions. There is still occlusion of the right upper lobe bronchus with some associated atelectasis. 2. As noted on recent chest CT, some of the left lung nodules have enlarged. However, these have not increased in metabolic activity and remain well below blood pool. Surveillance is likely warranted. 3. New sclerosis in the upper sternal body compared to prior PET-CT. Possibilities include healing fracture, osteonecrosis (query interval radiation therapy), or an unusual metastatic lesion. The low metabolic activity argues against a metastatic lesion. Surveillance is suggested. 4. Aortic Atherosclerosis (ICD10-I70.0) and Emphysema  (ICD10-J43.9). Coronary atherosclerosis. Airway thickening is present, suggesting bronchitis or reactive airways disease. Prominent stool throughout the colon favors constipation. Prostatomegaly. Subacute superior endplate compression at T2. Old wedge compression fracture at L2. Electronically Signed   By: Van Clines M.D.   On: 01/23/2020 12:14    PERFORMANCE STATUS (ECOG) : 1 - Symptomatic but completely ambulatory  Review of Systems  Constitutional: Negative for activity change, appetite change, fatigue and unexpected weight change.  HENT: Negative for mouth sores and trouble swallowing.   Eyes: Negative for pain and visual disturbance.  Respiratory: Negative for cough and shortness of breath.   Cardiovascular: Negative for chest pain and leg swelling.  Gastrointestinal: Negative for abdominal distention, abdominal pain, constipation, diarrhea and nausea.  Endocrine: Negative for cold intolerance and polyuria.  Genitourinary: Negative for decreased urine volume and difficulty urinating.  Musculoskeletal: Positive for back pain (well controlled w/ topical liniment & tylenol). Negative for arthralgias, gait problem and myalgias.  Skin: Negative for rash and wound.  Neurological: Negative for dizziness, weakness and light-headedness.  Psychiatric/Behavioral: Negative for sleep disturbance. The patient is not nervous/anxious.   Unless otherwise noted, a complete review of systems is negative.  Physical Exam  General: No acute distress, frail-appearing, thin build.  Accompanied by daughter CV: skin appears well perfused. No extremity edema.  Pulmonary: nonlabored, no distress, room air Abdomen: not distended. No guarding MSK: ambulates w/o aids Skin: no obvious rashes  Neuro: nonfocal. Well oriented.   IMPRESSION: Patient seen for routine follow-up in clinic today.  Patient's daughter accompanies him and participates in visit as well.  He was seen by Dr. Tasia Catchings today and she and I  discussed his case including results of recent scans.  Patient says that he feels at baseline and is very pleased with "good news' from imaging studies  He continues to decline chemotherapy or radiation.  Says that he may consider immunotherapy but would like to think about it.  Would like to continue to monitor his cancer with imaging.  Previously declined community palliative care visits.  He reports some pain in his back which is chronic and ongoing.  He uses topical liniment which improves pain and intermittently uses Tylenol.  Says pain is well controlled.  Some exertional shortness of breath which is chronic and resolves with rest.  He continues to live alone and is functionally independent.  No significant changes in appetite.  Weight is stable.  No recent dizziness or falls.  No nausea or  vomiting.  He continues to decline hospice for the need for more frequent palliative care visits.  No changes to plan for CODE STATUS.  Case and plan discussed with Dr. Tasia Catchings who is in agreement.   PLAN: -Best supportive care -Possible immunotherapy -Plan for hospice in the future as patient needs increase/functional status declines -DNR/DNI -Follow-up via virtual/video visit in 3 months then again for face-to-face visit in 6 months -Return to clinic sooner if symptomatic  Patient expressed understanding and was in agreement with this plan. He also understands that He can call the clinic at any time with any questions, concerns, or complaints.   Time Total: 15 minutes  Visit consisted of counseling and education dealing with the complex and emotionally intense issues of symptom management and palliative care in the setting of serious and potentially life-threatening illness.Greater than 50%  of this time was spent counseling and coordinating care related to the above assessment and plan.  Signed by: Beckey Rutter, DNP, AGNP-C [place] at [facility] [phone] (clinic)

## 2020-01-25 NOTE — Progress Notes (Signed)
Radiation Oncology Follow up Note  Name: Colin Novak   Date:   01/25/2020 MRN:  [id number] DOB: [birth year]    This 74 y.o. male presents to the clinic today for follow-up of imaging studies in patient with known stage III non-small cell lung cancer treated with concurrent chemoradiation therapy of the right upper lobe.  REFERRING PROVIDER: Guadalupe Maple, MD  HPI: Patient is a 74 year old male recently seen for follow-up 4 months out having completed concurrent chemoradiation therapy and split course fashion for stage III non-small cell lung cancer right upper lobe.  He was having some confusion and occasional headaches.  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  The areas of increased size on chest CT did not correspond to increased metabolic activity on the PET CT scan.  He also had a brain MRI scan showing no evidence of any intracranial metastatic disease he did have small remote infarcts.  He is seen today is doing fairly well.  Specifically denies cough hemoptysis or chest tightness.  He is having no change in his neurologic status.  COMPLICATIONS OF TREATMENT: none  FOLLOW UP COMPLIANCE: keeps appointments   PHYSICAL EXAM:  BP 99/64 (BP Location: Left Arm, Patient Position: Sitting)   Pulse 81   Temp (!) 96.6 F (35.9 C) (Tympanic)   Resp 20   Wt 117 lb 6.4 oz (53.3 kg)   BMI 17.34 kg/m  Frail-appearing male in NAD.  Well-developed well-nourished patient in NAD. HEENT reveals PERLA, EOMI, discs not visualized.  Oral cavity is clear. No oral mucosal lesions are identified. Neck is clear without evidence of cervical or supraclavicular adenopathy. Lungs are clear to A&P. Cardiac examination is essentially unremarkable with regular rate and rhythm without murmur rub or thrill.  Abdomen is benign with no organomegaly or masses noted. Motor sensory and DTR levels are equal and symmetric in the upper and lower extremities. Cranial nerves II through XII are grossly intact. Proprioception is intact. No peripheral adenopathy or edema is identified. No motor or sensory levels are noted. Crude visual fields are within normal range.  RADIOLOGY RESULTS: PET CT scan MRI brain both reviewed compatible with above-stated findings  PLAN: Present time I believe we can continue to observe the patient.  He will see medical oncology this morning.  Further recommendations for systemic management will be made at that time.  I have asked to see him back in 6 months for follow-up.  We will be happy to reevaluate the patient anytime should further radiation oncology consultation be indicated.  I would like to take this opportunity to thank you for allowing me to participate in the care of your patient.Noreene Filbert, MD

## 2020-01-26 ENCOUNTER — Telehealth: Payer: Self-pay

## 2020-01-26 ENCOUNTER — Telehealth: Payer: Self-pay | Admitting: Family Medicine

## 2020-01-26 NOTE — Telephone Encounter (Signed)
-----   Message from Colin Server, MD sent at 01/25/2020  4:41 PM EDT ----- Recent glucose at 460.  Please advise patient to close follow-up with primary care provider for further management.  Also please advise patient to follow diabetic diet.

## 2020-01-26 NOTE — Telephone Encounter (Signed)
Patient has been notified. PCP is aware and is working on scheduling follow up.

## 2020-01-26 NOTE — Telephone Encounter (Signed)
Called pt to get him scheduled, he wanted me to reach out to his daughter [name]. I did but no answer, left vm

## 2020-01-26 NOTE — Telephone Encounter (Signed)
-----   Message from Valerie Roys, [place] sent at 01/25/2020  5:09 PM EDT ----- Appt ASAP please ----- Message ----- From: Earlie Server, MD Sent: 01/25/2020   4:45 PM EDT To: Valerie Roys, DO  Dr.Johnson,  I recently saw patient for lung cancer follow-up.  I noticed that his glucose was elevated at 460.  I have advised patient to follow-up with you for further management of his hyperglycemia/diabetes. Thank you very much  Earlie Server Hematology Oncology [place] at [facility] 01/25/2020

## 2020-01-31 ENCOUNTER — Inpatient Hospital Stay: Payer: Medicare Other | Admitting: Hospice and Palliative Medicine

## 2020-02-02 ENCOUNTER — Ambulatory Visit: Payer: Medicare Other | Admitting: Nurse Practitioner

## 2020-02-02 ENCOUNTER — Encounter: Payer: Self-pay | Admitting: Oncology

## 2020-02-03 ENCOUNTER — Encounter: Payer: Self-pay | Admitting: Nurse Practitioner

## 2020-02-03 ENCOUNTER — Ambulatory Visit: Payer: Medicare Other | Admitting: Nurse Practitioner

## 2020-02-03 ENCOUNTER — Other Ambulatory Visit: Payer: Self-pay

## 2020-02-03 VITALS — BP 95/60 | HR 77 | Temp 97.7°F | Ht 66.0 in | Wt 117.0 lb

## 2020-02-03 DIAGNOSIS — F321 Major depressive disorder, single episode, moderate: Secondary | ICD-10-CM

## 2020-02-03 DIAGNOSIS — E119 Type 2 diabetes mellitus without complications: Secondary | ICD-10-CM | POA: Insufficient documentation

## 2020-02-03 DIAGNOSIS — E7800 Pure hypercholesterolemia, unspecified: Secondary | ICD-10-CM | POA: Diagnosis not present

## 2020-02-03 DIAGNOSIS — Z794 Long term (current) use of insulin: Secondary | ICD-10-CM | POA: Insufficient documentation

## 2020-02-03 DIAGNOSIS — Z1329 Encounter for screening for other suspected endocrine disorder: Secondary | ICD-10-CM | POA: Diagnosis not present

## 2020-02-03 DIAGNOSIS — E440 Moderate protein-calorie malnutrition: Secondary | ICD-10-CM | POA: Diagnosis not present

## 2020-02-03 DIAGNOSIS — D692 Other nonthrombocytopenic purpura: Secondary | ICD-10-CM | POA: Diagnosis not present

## 2020-02-03 NOTE — Patient Instructions (Signed)
Carbohydrate Counting for Diabetes Mellitus, Adult  Carbohydrate counting is a method of keeping track of how many carbohydrates you eat. Eating carbohydrates naturally increases the amount of sugar (glucose) in the blood. Counting how many carbohydrates you eat helps keep your blood glucose within normal limits, which helps you manage your diabetes (diabetes mellitus). It is important to know how many carbohydrates you can safely have in each meal. This is different for every person. A diet and nutrition specialist (registered dietitian) can help you make a meal plan and calculate how many carbohydrates you should have at each meal and snack. Carbohydrates are found in the following foods:  Grains, such as breads and cereals.  Dried beans and soy products.  Starchy vegetables, such as potatoes, peas, and corn.  Fruit and fruit juices.  Milk and yogurt.  Sweets and snack foods, such as cake, cookies, candy, chips, and soft drinks. How do I count carbohydrates? There are two ways to count carbohydrates in food. You can use either of the methods or a combination of both. Reading "Nutrition Facts" on packaged food The "Nutrition Facts" list is included on the labels of almost all packaged foods and beverages in the [place] It includes:  The serving size.  Information about nutrients in each serving, including the grams (g) of carbohydrate per serving. To use the "Nutrition Facts":  Decide how many servings you will have.  Multiply the number of servings by the number of carbohydrates per serving.  The resulting number is the total amount of carbohydrates that you will be having. Learning standard serving sizes of other foods When you eat carbohydrate foods that are not packaged or do not include "Nutrition Facts" on the label, you need to measure the servings in order to count the amount of carbohydrates:  Measure the foods that you will eat with a food scale or measuring cup, if  needed.  Decide how many standard-size servings you will eat.  Multiply the number of servings by 15. Most carbohydrate-rich foods have about 15 g of carbohydrates per serving. ? For example, if you eat 8 oz (170 g) of strawberries, you will have eaten 2 servings and 30 g of carbohydrates (2 servings x 15 g = 30 g).  For foods that have more than one food mixed, such as soups and casseroles, you must count the carbohydrates in each food that is included. The following list contains standard serving sizes of common carbohydrate-rich foods. Each of these servings has about 15 g of carbohydrates:   hamburger bun or  English muffin.   oz (15 mL) syrup.   oz (14 g) jelly.  1 slice of bread.  1 six-inch tortilla.  3 oz (85 g) cooked rice or pasta.  4 oz (113 g) cooked dried beans.  4 oz (113 g) starchy vegetable, such as peas, corn, or potatoes.  4 oz (113 g) hot cereal.  4 oz (113 g) mashed potatoes or  of a large baked potato.  4 oz (113 g) canned or frozen fruit.  4 oz (120 mL) fruit juice.  4-6 crackers.  6 chicken nuggets.  6 oz (170 g) unsweetened dry cereal.  6 oz (170 g) plain fat-free yogurt or yogurt sweetened with artificial sweeteners.  8 oz (240 mL) milk.  8 oz (170 g) fresh fruit or one small piece of fruit.  24 oz (680 g) popped popcorn. Example of carbohydrate counting Sample meal  3 oz (85 g) chicken breast.  6 oz (170 g)   brown rice.  4 oz (113 g) corn.  8 oz (240 mL) milk.  8 oz (170 g) strawberries with sugar-free whipped topping. Carbohydrate calculation 1. Identify the foods that contain carbohydrates: ? Rice. ? Corn. ? Milk. ? Strawberries. 2. Calculate how many servings you have of each food: ? 2 servings rice. ? 1 serving corn. ? 1 serving milk. ? 1 serving strawberries. 3. Multiply each number of servings by 15 g: ? 2 servings rice x 15 g = 30 g. ? 1 serving corn x 15 g = 15 g. ? 1 serving milk x 15 g = 15 g. ? 1  serving strawberries x 15 g = 15 g. 4. Add together all of the amounts to find the total grams of carbohydrates eaten: ? 30 g + 15 g + 15 g + 15 g = 75 g of carbohydrates total. Summary  Carbohydrate counting is a method of keeping track of how many carbohydrates you eat.  Eating carbohydrates naturally increases the amount of sugar (glucose) in the blood.  Counting how many carbohydrates you eat helps keep your blood glucose within normal limits, which helps you manage your diabetes.  A diet and nutrition specialist (registered dietitian) can help you make a meal plan and calculate how many carbohydrates you should have at each meal and snack. This information is not intended to replace advice given to you by your health care provider. Make sure you discuss any questions you have with your health care provider. Document Revised: 05/07/2017 Document Reviewed: 03/26/2016 Elsevier Patient Education  2020 [employer] Inc.  

## 2020-02-03 NOTE — Assessment & Plan Note (Signed)
Chronic, stable.  Reassurance provided.  Call or return to clinic with concerns.

## 2020-02-03 NOTE — Progress Notes (Signed)
BP 95/60 (BP Location: Left Arm, Patient Position: Sitting, Cuff Size: Normal)   Pulse 77   Temp 97.7 F (36.5 C) (Oral)   Ht 5\' 6"  (1.676 m)   Wt 117 lb (53.1 kg)   SpO2 96%   BMI 18.88 kg/m    Subjective:    Patient ID: Colin Novak, male    DOB: May 08, 1946, 74 y.o.   MRN: [id number]  HPI: Colin Novak is a 74 y.o. male presenting for follow up.  Patient states he does not have any concerns.   Chief Complaint  Patient presents with  . Diabetes   DIABETES Hypoglycemic episodes:no Polydipsia/polyuria: no Visual disturbance: no Chest pain: no Paresthesias: no Glucose Monitoring: yes  Accucheck frequency: BID  Fasting glucose: >200  Evening: 100s per daughter but patient does not remember Taking Insulin?: yes  Long acting insulin: 14 units Lantus  Short acting insulin: Humalog sliding scale during day Blood Pressure Monitoring: not checking Retinal Examination: Not up to Date; will call to make appointment Foot Exam: Done today Diabetic Education: Completed Pneumovax: Up to Date Influenza: Up to Date Aspirin: yes  HYPERLIPIDEMIA Hyperlipidemia status: chronic Satisfied with current treatment?  yes Side effects:  no Medication compliance: excellent compliance Past cholesterol meds: Vytorin Supplements: none Aspirin:  yes The 10-year ASCVD risk score Mikey Bussing DC Jr., et al., 2013) is: 26.9%   Values used to calculate the score:     Age: 4 years     Sex: Male     Is Non-Hispanic African American: No     Diabetic: Yes     Tobacco smoker: Yes     Systolic Blood Pressure: 95 mmHg     Is BP treated: No     HDL Cholesterol: 57 mg/dL     Total Cholesterol: 186 mg/dL Chest pain:  no Coronary artery disease:  no Family history CAD:  no Family history early CAD:  no   Allergies  Allergen Reactions  . Penicillins Rash   Outpatient Encounter Medications as of 02/03/2020  Medication Sig  . albuterol (PROAIR HFA) 108 (90 Base) MCG/ACT inhaler Inhale 1-2 puffs  into the lungs every 6 (six) hours as needed for wheezing or shortness of breath (use as needed for chest congestion.).  Marland Kitchen aspirin EC 81 MG tablet Take 81 mg by mouth daily.  . citalopram (CELEXA) 20 MG tablet Take 1 tablet (20 mg total) by mouth daily.  Marland Kitchen Dextromethorphan-guaiFENesin (ROBITUSSIN DM PO) Take 5 mLs by mouth at bedtime.   Marland Kitchen ezetimibe-simvastatin (VYTORIN) 10-40 MG tablet Take 1 tablet by mouth daily.  . Fluticasone-Umeclidin-Vilant (TRELEGY ELLIPTA) 100-62.5-25 MCG/INH AEPB Inhale 1 applicator into the lungs daily. Rinse mouth after use.  Marland Kitchen HUMALOG KWIKPEN 100 UNIT/ML KwikPen 16 Units. Sliding scale up to 12 units  . LANTUS SOLOSTAR 100 UNIT/ML Solostar Pen INJECT 10-20 UNITS INTO THE SKIN DAILY  . metFORMIN (GLUCOPHAGE) 500 MG tablet TAKE ONE TABLET BY MOUTH TWICE DAILY  . Multiple Vitamin (MULTIVITAMIN) tablet Take 1 tablet by mouth daily.  Marland Kitchen omeprazole (PRILOSEC) 20 MG capsule Take 1 capsule (20 mg total) by mouth daily.  . pioglitazone (ACTOS) 30 MG tablet Take 30 mg by mouth daily.   Marland Kitchen glucose blood (ONE TOUCH ULTRA TEST) test strip 1 each by Other route 2 (two) times daily as needed for other. Dx: E11.9  . NOVOFINE 32G X 6 MM MISC USE AS DIRECTED. 5 INJECTIONS A DAY  . oxymetazoline (AFRIN) 0.05 % nasal spray Place 2 sprays into  both nostrils 2 (two) times daily as needed.   . pseudoephedrine-acetaminophen (TYLENOL SINUS) 30-500 MG TABS tablet Take 2 tablets by mouth every 4 (four) hours as needed. Only using twice daily  . triamcinolone (NASACORT) 55 MCG/ACT AERO nasal inhaler Place 1 spray into the nose 2 (two) times daily. (Patient not taking: Reported on 02/03/2020)  . [DISCONTINUED] insulin aspart (NOVOLOG) 100 UNIT/ML injection Inject into the skin 3 (three) times daily before meals. Sliding scale  . [DISCONTINUED] predniSONE (DELTASONE) 20 MG tablet Take 20 mg by mouth daily with breakfast.  . [DISCONTINUED] prochlorperazine (COMPAZINE) 10 MG tablet Take 1 tablet (10 mg  total) by mouth every 6 (six) hours as needed (Nausea or vomiting). (Patient not taking: Reported on 01/13/2020)   No facility-administered encounter medications on file as of 02/03/2020.   Patient Active Problem List   Diagnosis Date Noted  . Type 2 diabetes mellitus without complication, with long-term current use of insulin ([place]) 02/03/2020  . Malignant neoplasm of overlapping sites of lung ([place]) 01/16/2020  . Goals of care, counseling/discussion 06/20/2019  . Protein-calorie malnutrition ([place]) 06/20/2019  . Non-small cell lung cancer ([place]) 06/20/2019  . Moderate protein-calorie malnutrition ([place]) 05/24/2018  . Tobacco dependence 05/24/2018  . Underweight 05/24/2018  . Weight loss, unintentional 05/24/2018  . Edema extremities 11/19/2017  . Depression, major, single episode, moderate ([place]) 11/19/2017  . Advanced care planning/counseling discussion 11/19/2017  . Senile purpura ([place]) 04/01/2017  . COPD (chronic obstructive pulmonary disease) ([place]) 07/24/2015  . Malnutrition ([place]) 07/24/2015  . Skin lesion 07/24/2015  . BPH (benign prostatic hyperplasia) 07/24/2015  . Diabetes mellitus associated with hormonal etiology ([place]) 04/11/2015  . Hyperlipidemia 04/11/2015   Past Medical History:  Diagnosis Date  . Anxiety   . COPD (chronic obstructive pulmonary disease) ([place])   . Diabetes mellitus without complication ([place])    type 2  . GERD (gastroesophageal reflux disease)   . Hyperlipidemia    Relevant past medical, surgical, family and social history reviewed and updated as indicated. Interim medical history since our last visit reviewed.  Review of Systems  Constitutional: Positive for appetite change (due to cancer treatment). Negative for activity change, chills, fatigue and fever.  Eyes: Negative.  Negative for visual disturbance.  Respiratory: Positive for cough (chronic per patient). Negative for chest tightness, shortness of breath and wheezing.   Cardiovascular: Negative.   Negative for chest pain and palpitations.  Gastrointestinal: Negative.  Negative for abdominal distention, abdominal pain, diarrhea, nausea and vomiting.  Endocrine: Negative.  Negative for polydipsia, polyphagia and polyuria.  Genitourinary: Negative.  Negative for difficulty urinating, dysuria and urgency.  Musculoskeletal: Negative.   Skin: Negative.  Negative for color change and pallor.  Neurological: Negative for dizziness, tremors, weakness, numbness and headaches.  Psychiatric/Behavioral: Negative.  Negative for confusion and sleep disturbance. The patient is not nervous/anxious.    Per HPI unless specifically indicated above     Objective:    BP 95/60 (BP Location: Left Arm, Patient Position: Sitting, Cuff Size: Normal)   Pulse 77   Temp 97.7 F (36.5 C) (Oral)   Ht 5\' 6"  (1.676 m)   Wt 117 lb (53.1 kg)   SpO2 96%   BMI 18.88 kg/m   Wt Readings from Last 3 Encounters:  02/03/20 117 lb (53.1 kg)  01/25/20 117 lb 6.4 oz (53.3 kg)  01/25/20 117 lb (53.1 kg)    Physical Exam Vitals and nursing note reviewed.  Constitutional:      General: He is not in  acute distress.    Appearance: He is underweight. He is not ill-appearing or diaphoretic.  HENT:     Head: Normocephalic and atraumatic.  Eyes:     General: No scleral icterus.    Extraocular Movements: Extraocular movements intact.  Cardiovascular:     Rate and Rhythm: Normal rate and regular rhythm.     Pulses: Normal pulses.  Pulmonary:     Effort: Pulmonary effort is normal.     Breath sounds: Transmitted upper airway sounds present.  Abdominal:     General: Abdomen is flat. Bowel sounds are normal. There is no distension.     Palpations: Abdomen is soft.     Tenderness: There is no abdominal tenderness.  Musculoskeletal:     Cervical back: Normal range of motion. No rigidity.     Right lower leg: No edema.     Left lower leg: No edema.  Skin:    General: Skin is warm and dry.     Capillary Refill:  Capillary refill takes less than 2 seconds.     Coloration: Skin is not jaundiced or pale.  Neurological:     General: No focal deficit present.     Mental Status: He is alert and oriented to person, place, and time.     Motor: No weakness.     Gait: Gait normal.  Psychiatric:        Mood and Affect: Mood normal.        Behavior: Behavior normal.       Assessment & Plan:   Problem List Items Addressed This Visit      Cardiovascular and Mediastinum   Senile purpura (HCC)    Chronic, stable.  Reassurance provided.  Call or return to clinic with concerns.        Endocrine   Type 2 diabetes mellitus without complication, with long-term current use of insulin (HCC)    Chronic, uncontrolled with fasting blood sugars in 200s.  Follows with Endocrinology, next appointment in 3 weeks.  Discussed diet and recommendations made.  Recommended start Trulicity once weekly injectable to help lower blood sugar, however patient and daughter hesitant to start any new medication without endocrinologist input and decline Trulicity today.  Will defer to endocrinologist.  If fasting blood sugars continue to increase, advised to call or return to clinic.  In meantime, try to get in sooner with Endocrinologist.        Other   Hyperlipidemia    Chronic, stable on Vytorin.  Continue current regimen.  Lipids checked today.      Relevant Orders   Lipid Panel w/o Chol/HDL Ratio   Depression, major, single episode, moderate (HCC)    Chronic, stable.  Reports good control of mood on celexa, continue current medication.        Moderate protein-calorie malnutrition (HCC) - Primary    Chronic, ongoing.  Discussed involvement of nutritionist for further assistance with choosing foods both high in protein to help weight gain and also low in carbohydrates - patient and daughter decline for now.  Work on increasing caloric intake with Glucerna shakes.  Recent labs from oncology reviewed.       Other Visit  Diagnoses    Thyroid disorder screening       Relevant Orders   TSH       Follow up plan: Return in about 3 months (around 05/04/2020) for Follow up.

## 2020-02-03 NOTE — Assessment & Plan Note (Addendum)
Chronic, uncontrolled with fasting blood sugars in 200s.  Follows with Endocrinology, next appointment in 3 weeks.  Diet discussed and recommendations made.  Recommended start Trulicity once weekly injectable to help lower blood sugar, however patient and daughter hesitant to start any new medication without endocrinologist input and decline Trulicity today.  Will defer medication adjustments for diabetes to endocrinologist.  If fasting blood sugars continue to increase, advised to call or return to clinic.  In meantime, try to get in sooner with Endocrinologist.

## 2020-02-03 NOTE — Assessment & Plan Note (Signed)
Chronic, stable on Vytorin.  Continue current regimen.  Lipids checked today.

## 2020-02-03 NOTE — Assessment & Plan Note (Signed)
Chronic, stable.  Reports good control of mood on celexa, continue current medication.

## 2020-02-03 NOTE — Assessment & Plan Note (Addendum)
Chronic, ongoing.  Discussed involvement of nutritionist for further assistance with choosing foods both high in protein to help weight gain and also low in carbohydrates - patient and daughter decline for now.  Work on increasing caloric intake with Glucerna shakes.  Recent labs from oncology reviewed.

## 2020-02-04 LAB — LIPID PANEL W/O CHOL/HDL RATIO
Cholesterol, Total: 97 mg/dL — ABNORMAL LOW (ref 100–199)
HDL: 41 mg/dL (ref >39)
LDL Chol Calc (NIH): 42 mg/dL (ref 0–99)
Triglycerides: 64 mg/dL (ref 0–149)
VLDL Cholesterol Cal: 14 mg/dL (ref 5–40)

## 2020-02-04 LAB — TSH: TSH: 0.745 u[IU]/mL (ref 0.450–4.500)

## 2020-04-25 ENCOUNTER — Inpatient Hospital Stay: Payer: Medicare Other | Attending: Hospice and Palliative Medicine | Admitting: Hospice and Palliative Medicine

## 2020-04-25 ENCOUNTER — Encounter: Payer: Self-pay | Admitting: Hospice and Palliative Medicine

## 2020-04-25 DIAGNOSIS — C3490 Malignant neoplasm of unspecified part of unspecified bronchus or lung: Secondary | ICD-10-CM

## 2020-04-25 DIAGNOSIS — Z515 Encounter for palliative care: Secondary | ICD-10-CM

## 2020-04-25 DIAGNOSIS — Z923 Personal history of irradiation: Secondary | ICD-10-CM | POA: Diagnosis not present

## 2020-04-25 DIAGNOSIS — G893 Neoplasm related pain (acute) (chronic): Secondary | ICD-10-CM | POA: Diagnosis not present

## 2020-04-25 NOTE — Progress Notes (Signed)
Patient stated that he is having a light pain in his armpit

## 2020-04-25 NOTE — Progress Notes (Signed)
Virtual Visit via Telephone Note  I connected with Colin Novak on 04/25/20 at  1:00 PM EDT by telephone and verified that I am speaking with the correct person using two identifiers.   I discussed the limitations, risks, security and privacy concerns of performing an evaluation and management service by telephone and the availability of in person appointments. I also discussed with the patient that there may be a patient responsible charge related to this service. The patient expressed understanding and agreed to proceed.   History of Present Illness: Mr. Colin Novak is a 74 year old man with multiple medical problems including stage III-IV non-small cell lung cancer who declined systemic chemotherapy plus/minus immunotherapy but is status post RT. There is been discussion of hospice involvement once RT is completed. He is now under active surveillance.  CT of the chest on 01/23/2020 revealed slight interval increase in size of his paramediastinal/hilar mass and left lower lobe and left upper lobe nodules but decrease in size of mediastinal lymph nodes.  Patient was referred to palliative care to help address goals and manage ongoing symptoms..    Observations/Objective: I called and spoke with patient by phone.  He reports that he is doing well.  He denies any significant changes or concerns today.  He says that he has chronic exertional dyspnea but it is unchanged in characteristic or intensity.  He also has intermittent pain beneath his right axilla, which is likely from his lung cancer.  He says he will take acetaminophen for pain if needed.  We discussed the option of prescribing something stronger in the future if necessary.  There had previously been discussion about hospice involvement once patient completed XRT.  I again brought up the option of home resources but patient declined.  He says that he does not feel that he needs any home support at the present time.  Patient instructed to  call the clinic in the event of any changes or decline.  Assessment and Plan: Lung cancer -status post XRT on active surveillance.  Plan is for follow-up CT in September and then he will see Dr. Tasia Catchings at that time.  Patient would likely benefit from home-based palliative care following him monthly but patient declined.    Follow Up Instructions: RTC in 3 months   I discussed the assessment and treatment plan with the patient. The patient was provided an opportunity to ask questions and all were answered. The patient agreed with the plan and demonstrated an understanding of the instructions.   The patient was advised to call back or seek an in-person evaluation if the symptoms worsen or if the condition fails to improve as anticipated.  I provided 5 minutes of non-face-to-face time during this encounter.   Irean Hong, NP

## 2020-06-04 ENCOUNTER — Telehealth: Payer: Self-pay | Admitting: *Deleted

## 2020-06-04 NOTE — Telephone Encounter (Signed)
Received message from pt's daughter asking that pt not be scheduled for any virtual visits going forward. Pt requests only in person visits in the future.

## 2020-07-24 ENCOUNTER — Other Ambulatory Visit: Payer: Self-pay

## 2020-07-24 ENCOUNTER — Ambulatory Visit
Admission: RE | Admit: 2020-07-24 | Discharge: 2020-07-24 | Disposition: A | Discharge status: Home / Self Care | Payer: Medicare Other | Source: Ambulatory Visit | Attending: Oncology | Admitting: Oncology

## 2020-07-24 DIAGNOSIS — I77810 Thoracic aortic ectasia: Secondary | ICD-10-CM | POA: Diagnosis not present

## 2020-07-24 DIAGNOSIS — C3490 Malignant neoplasm of unspecified part of unspecified bronchus or lung: Secondary | ICD-10-CM | POA: Insufficient documentation

## 2020-07-24 DIAGNOSIS — I700 Atherosclerosis of aorta: Secondary | ICD-10-CM | POA: Diagnosis not present

## 2020-07-24 DIAGNOSIS — I2510 Atherosclerotic heart disease of native coronary artery without angina pectoris: Secondary | ICD-10-CM | POA: Diagnosis not present

## 2020-07-24 LAB — POCT I-STAT CREATININE: Creatinine, Ser: 0.6 mg/dL — ABNORMAL LOW (ref 0.61–1.24)

## 2020-07-24 MED ORDER — IOHEXOL 300 MG/ML  SOLN
75.0000 mL | Freq: Once | INTRAMUSCULAR | Status: AC | PRN
  Administered 2020-07-24: 75 mL via INTRAVENOUS

## 2020-07-25 ENCOUNTER — Ambulatory Visit: Payer: Medicare Other | Admitting: Radiation Oncology

## 2020-07-26 ENCOUNTER — Encounter: Payer: Self-pay | Admitting: Oncology

## 2020-07-26 ENCOUNTER — Inpatient Hospital Stay: Payer: Medicare Other | Attending: Oncology

## 2020-07-26 ENCOUNTER — Other Ambulatory Visit: Payer: Self-pay

## 2020-07-26 ENCOUNTER — Inpatient Hospital Stay (HOSPITAL_BASED_OUTPATIENT_CLINIC_OR_DEPARTMENT_OTHER): Payer: Medicare Other | Admitting: Oncology

## 2020-07-26 ENCOUNTER — Encounter: Payer: Medicare Other | Admitting: Hospice and Palliative Medicine

## 2020-07-26 ENCOUNTER — Ambulatory Visit
Admission: RE | Admit: 2020-07-26 | Discharge: 2020-07-26 | Disposition: A | Discharge status: Home / Self Care | Payer: Medicare Other | Source: Ambulatory Visit | Attending: Radiation Oncology | Admitting: Radiation Oncology

## 2020-07-26 ENCOUNTER — Inpatient Hospital Stay: Payer: Medicare Other

## 2020-07-26 VITALS — BP 91/55 | HR 88 | Temp 95.2°F | Resp 12 | Wt 96.6 lb

## 2020-07-26 DIAGNOSIS — Z7982 Long term (current) use of aspirin: Secondary | ICD-10-CM | POA: Insufficient documentation

## 2020-07-26 DIAGNOSIS — Z9221 Personal history of antineoplastic chemotherapy: Secondary | ICD-10-CM | POA: Diagnosis not present

## 2020-07-26 DIAGNOSIS — E861 Hypovolemia: Secondary | ICD-10-CM

## 2020-07-26 DIAGNOSIS — E871 Hypo-osmolality and hyponatremia: Secondary | ICD-10-CM | POA: Diagnosis not present

## 2020-07-26 DIAGNOSIS — R634 Abnormal weight loss: Secondary | ICD-10-CM | POA: Diagnosis not present

## 2020-07-26 DIAGNOSIS — E46 Unspecified protein-calorie malnutrition: Secondary | ICD-10-CM | POA: Insufficient documentation

## 2020-07-26 DIAGNOSIS — Z7189 Other specified counseling: Secondary | ICD-10-CM

## 2020-07-26 DIAGNOSIS — C3411 Malignant neoplasm of upper lobe, right bronchus or lung: Secondary | ICD-10-CM | POA: Diagnosis not present

## 2020-07-26 DIAGNOSIS — C3490 Malignant neoplasm of unspecified part of unspecified bronchus or lung: Secondary | ICD-10-CM

## 2020-07-26 DIAGNOSIS — F17210 Nicotine dependence, cigarettes, uncomplicated: Secondary | ICD-10-CM | POA: Insufficient documentation

## 2020-07-26 DIAGNOSIS — Z923 Personal history of irradiation: Secondary | ICD-10-CM | POA: Insufficient documentation

## 2020-07-26 DIAGNOSIS — R918 Other nonspecific abnormal finding of lung field: Secondary | ICD-10-CM | POA: Insufficient documentation

## 2020-07-26 DIAGNOSIS — K219 Gastro-esophageal reflux disease without esophagitis: Secondary | ICD-10-CM | POA: Insufficient documentation

## 2020-07-26 DIAGNOSIS — I9589 Other hypotension: Secondary | ICD-10-CM | POA: Insufficient documentation

## 2020-07-26 DIAGNOSIS — J449 Chronic obstructive pulmonary disease, unspecified: Secondary | ICD-10-CM | POA: Diagnosis not present

## 2020-07-26 DIAGNOSIS — C3480 Malignant neoplasm of overlapping sites of unspecified bronchus and lung: Secondary | ICD-10-CM

## 2020-07-26 DIAGNOSIS — E1165 Type 2 diabetes mellitus with hyperglycemia: Secondary | ICD-10-CM | POA: Insufficient documentation

## 2020-07-26 DIAGNOSIS — R5383 Other fatigue: Secondary | ICD-10-CM | POA: Insufficient documentation

## 2020-07-26 DIAGNOSIS — Z79899 Other long term (current) drug therapy: Secondary | ICD-10-CM | POA: Insufficient documentation

## 2020-07-26 DIAGNOSIS — Z794 Long term (current) use of insulin: Secondary | ICD-10-CM | POA: Diagnosis not present

## 2020-07-26 DIAGNOSIS — E1136 Type 2 diabetes mellitus with diabetic cataract: Secondary | ICD-10-CM | POA: Insufficient documentation

## 2020-07-26 DIAGNOSIS — E785 Hyperlipidemia, unspecified: Secondary | ICD-10-CM | POA: Diagnosis not present

## 2020-07-26 LAB — CBC WITH DIFFERENTIAL/PLATELET
Abs Immature Granulocytes: 0.01 10*3/uL (ref 0.00–0.07)
Basophils Absolute: 0 10*3/uL (ref 0.0–0.1)
Basophils Relative: 1 %
Eosinophils Absolute: 0 10*3/uL (ref 0.0–0.5)
Eosinophils Relative: 0 %
HCT: 44.5 % (ref 39.0–52.0)
Hemoglobin: 16.3 g/dL (ref 13.0–17.0)
Immature Granulocytes: 0 %
Lymphocytes Relative: 18 %
Lymphs Abs: 0.7 10*3/uL (ref 0.7–4.0)
MCH: 33.7 pg (ref 26.0–34.0)
MCHC: 36.6 g/dL — ABNORMAL HIGH (ref 30.0–36.0)
MCV: 91.9 fL (ref 80.0–100.0)
Monocytes Absolute: 0.5 10*3/uL (ref 0.1–1.0)
Monocytes Relative: 12 %
Neutro Abs: 2.8 10*3/uL (ref 1.7–7.7)
Neutrophils Relative %: 69 %
Platelets: 200 10*3/uL (ref 150–400)
RBC: 4.84 MIL/uL (ref 4.22–5.81)
RDW: 12.4 % (ref 11.5–15.5)
WBC: 4.1 10*3/uL (ref 4.0–10.5)
nRBC: 0 % (ref 0.0–0.2)

## 2020-07-26 LAB — COMPREHENSIVE METABOLIC PANEL
ALT: 12 U/L (ref 0–44)
AST: 15 U/L (ref 15–41)
Albumin: 4 g/dL (ref 3.5–5.0)
Alkaline Phosphatase: 93 U/L (ref 38–126)
Anion gap: 12 (ref 5–15)
BUN: 15 mg/dL (ref 8–23)
CO2: 26 mmol/L (ref 22–32)
Calcium: 9 mg/dL (ref 8.9–10.3)
Chloride: 92 mmol/L — ABNORMAL LOW (ref 98–111)
Creatinine, Ser: 0.77 mg/dL (ref 0.61–1.24)
GFR calc Af Amer: >60 mL/min (ref >60)
GFR calc non Af Amer: >60 mL/min (ref >60)
Glucose, Bld: 395 mg/dL — ABNORMAL HIGH (ref 70–99)
Potassium: 4.4 mmol/L (ref 3.5–5.1)
Sodium: 130 mmol/L — ABNORMAL LOW (ref 135–145)
Total Bilirubin: 1 mg/dL (ref 0.3–1.2)
Total Protein: 7.1 g/dL (ref 6.5–8.1)

## 2020-07-26 MED ORDER — SODIUM CHLORIDE 0.9 % IV SOLN
Freq: Once | INTRAVENOUS | Status: AC
  Filled 2020-07-26: qty 250

## 2020-07-26 MED ORDER — MEGESTROL ACETATE 400 MG/10ML PO SUSP: 400.0000 mg | Freq: Every day | ORAL | 0 refills | Status: DC

## 2020-07-26 MED ORDER — AFATINIB DIMALEATE 40 MG PO TABS: 40.0000 mg | ORAL_TABLET | Freq: Every day | ORAL | 0 refills | Status: DC

## 2020-07-26 NOTE — Progress Notes (Signed)
Hematology/Oncology follow up note [facility] [phone] [phone]   Patient Care Team: Guadalupe Maple, MD as PCP - General (Family Medicine) Gabriel Carina, Betsey Holiday, MD as Physician Assistant (Endocrinology) Kem Parkinson, MD (Ophthalmology) Telford Nab, RN as Registered Nurse Tamala Julian, Jonette Eva, NP as Nurse Practitioner Noreene Filbert, MD as Radiation Oncologist (Radiation Oncology)  REFERRING PROVIDER: Guadalupe Maple, MD  CHIEF COMPLAINTS/REASON FOR VISIT:  Follow up for non-small cell lung cancer  HISTORY OF PRESENTING ILLNESS:  Colin Novak is a  74 y.o.  male with PMH listed below was seen in consultation at the request of  Guadalupe Maple, MD  for evaluation of lung mass Patient reports ongoing shortness of breath, cough for  few months. He had CT chest scan done on 06/01/2019 which showed a concerning feature of 9.1 x 5.1 x 3.6 cm right upper lobe mass extending into mediastinum, encasing and occluding right upper lobe bronchus and some of the right upper lobe bronchial branches.  There is also significant narrowing of FVC. He also had a similar appearance of left upper lobe mass without mediastinal extension suspicious for a synchronized primary lung cancer Multiple additional smaller bilateral lung nodules, left greater than right. Mildly enlarged right hilar and mediastinal lymph nodes suspicious for metastatic lymphadenopathy. 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 Daughter [name] is his power of attorney. Denies fever or chills.  # patient was seen by me on 06/20/2019.  At that time, patient has 2 separate lung tumor.  Possibility of stage IV lung adenocarcinoma TX N1 M1 discussed with patient given that he has tumor in the contralateral lobe.  Systemic chemotherapy with immunotherapy was recommended.  Patient declined at  that time. He may have 2 separate primary lung cancer, if that is the case, he has stage III lung cancer.  would benefit from definitive concurrent chemoradiation followed by immunotherapy for maintenance. Patient has decided at that time that he does not want any systemic chemotherapy.  He underwent SBRT to right upper lobe lesion.  Repeat CT scan showed interval increase of the right para mediastinal and hilar mass and also multiple areas of increased density in the left lower lobe consistent with multifocal adenocarcinoma/metastasis.  #01/23/2020, PET scan showed a decreased left upper lobe and right upper lobe lesion. There is still occlusion of the right upper lobe bronchus with some associated atelectasis.Enlarged left lung nodules have not had increased metabolic activity and remained well below blood pool. New sclerosis in the upper sternal body comparing to previous PET scan. -Patient was not interested in any systemic treatment.  Declined immunotherapy maintenance.  NGS was sent.  he follows up with palliative care service.  INTERVAL HISTORY Colin Novak is a 74 y.o. male who has above history reviewed by me today presents for follow up visit for management of  non-small cell lung cancer. Problems and complaints are listed below: Patient reports decrease of appetite, unintentional weight loss of 20 pounds, increased shortness of breath, fatigue.  He was accompanied by her daughter who is her power of attorney. He has had interval CT scan done.     Review of Systems  Constitutional: Positive for fatigue and unexpected weight change. Negative for appetite change, chills, diaphoresis and fever.  HENT:   Negative for hearing loss, lump/mass, nosebleeds, sore throat and voice change.   Eyes: Negative for eye problems and icterus.  Respiratory:  Positive for cough and shortness of breath. Negative for chest tightness, hemoptysis and wheezing.   Cardiovascular: Negative for chest pain and  leg swelling.  Gastrointestinal: Negative for abdominal distention, abdominal pain, blood in stool, diarrhea, nausea and rectal pain.  Endocrine: Negative for hot flashes.  Genitourinary: Negative for bladder incontinence, difficulty urinating, dysuria, frequency, hematuria and nocturia.   Musculoskeletal: Positive for back pain. Negative for arthralgias, flank pain, gait problem and myalgias.  Skin: Negative for itching and rash.  Neurological: Negative for dizziness, gait problem, headaches, light-headedness, numbness and seizures.  Hematological: Negative for adenopathy. Does not bruise/bleed easily.  Psychiatric/Behavioral: Negative for confusion and decreased concentration. The patient is not nervous/anxious.     MEDICAL HISTORY:  Past Medical History:  Diagnosis Date  . Anxiety   . COPD (chronic obstructive pulmonary disease) ([place])   . Diabetes mellitus without complication ([place])    type 2  . GERD (gastroesophageal reflux disease)   . Hyperlipidemia     SURGICAL HISTORY: Past Surgical History:  Procedure Laterality Date  . APPENDECTOMY    . ENDOBRONCHIAL ULTRASOUND Bilateral 06/13/2019   Procedure: ENDOBRONCHIAL ULTRASOUND, BILATERAL;  Surgeon: [name], MD;  Location: [facility] ORS;  Service: Pulmonary;  Laterality: Bilateral;  . EYE SURGERY Bilateral    cataract    SOCIAL HISTORY: Social History   Socioeconomic History  . Marital status: Widowed    Spouse name: Not on file  . Number of children: 1  . Years of education: Not on file  . Highest education level: High school graduate  Occupational History  . Occupation: retired  Tobacco Use  . Smoking status: Current Every Day Smoker    Packs/day: 1.00    Types: Cigarettes  . Smokeless tobacco: Former Systems developer    Types: Chew    Quit date: 06/10/1959  . Tobacco comment: 1 pack daily- 06/09/2019  Vaping Use  . Vaping Use: Never used  Substance and Sexual Activity  . Alcohol use: No  . Drug use: No  . Sexual  activity: Not Currently  Other Topics Concern  . Not on file  Social History Narrative  . Not on file   Social Determinants of Health   Financial Resource Strain:   . Difficulty of Paying Living Expenses: Not on file  Food Insecurity:   . Worried About Charity fundraiser in the Last Year: Not on file  . Ran Out of Food in the Last Year: Not on file  Transportation Needs:   . Lack of Transportation (Medical): Not on file  . Lack of Transportation (Non-Medical): Not on file  Physical Activity:   . Days of Exercise per Week: Not on file  . Minutes of Exercise per Session: Not on file  Stress:   . Feeling of Stress : Not on file  Social Connections:   . Frequency of Communication with Friends and Family: Not on file  . Frequency of Social Gatherings with Friends and Family: Not on file  . Attends Religious Services: Not on file  . Active Member of Clubs or Organizations: Not on file  . Attends Archivist Meetings: Not on file  . Marital Status: Not on file  Intimate Partner Violence:   . Fear of Current or Ex-Partner: Not on file  . Emotionally Abused: Not on file  . Physically Abused: Not on file  . Sexually Abused: Not on file    FAMILY HISTORY: Family History  Problem Relation Age of Onset  . Cancer Mother  lung (non smoker)  . Diabetes Father   . Hypertension Father   . Lymphoma Brother   . Prostate cancer Brother     ALLERGIES:  is allergic to penicillins.  MEDICATIONS:  Current Outpatient Medications  Medication Sig Dispense Refill  . albuterol (PROAIR HFA) 108 (90 Base) MCG/ACT inhaler Inhale 1-2 puffs into the lungs every 6 (six) hours as needed for wheezing or shortness of breath (use as needed for chest congestion.). 1 g 3  . aspirin EC 81 MG tablet Take 81 mg by mouth daily.    Marland Kitchen Dextromethorphan-guaiFENesin (ROBITUSSIN DM PO) Take 5 mLs by mouth at bedtime.     Marland Kitchen ezetimibe-simvastatin (VYTORIN) 10-40 MG tablet Take 1 tablet by mouth  daily. 90 tablet 4  . Fluticasone-Umeclidin-Vilant (TRELEGY ELLIPTA) 100-62.5-25 MCG/INH AEPB Inhale 1 applicator into the lungs daily. Rinse mouth after use. 1 each 10  . glucose blood (ONE TOUCH ULTRA TEST) test strip 1 each by Other route 2 (two) times daily as needed for other. Dx: E11.9 100 each 12  . HUMALOG KWIKPEN 100 UNIT/ML KwikPen 16 Units. Sliding scale up to 12 units    . LANTUS SOLOSTAR 100 UNIT/ML Solostar Pen INJECT 10-20 UNITS INTO THE SKIN DAILY 5 pen 12  . Multiple Vitamin (MULTIVITAMIN) tablet Take 1 tablet by mouth daily.    Marland Kitchen NOVOFINE 32G X 6 MM MISC USE AS DIRECTED. 5 INJECTIONS A DAY 900 each 0  . oxymetazoline (AFRIN) 0.05 % nasal spray Place 2 sprays into both nostrils 2 (two) times daily as needed.     . citalopram (CELEXA) 20 MG tablet Take 1 tablet (20 mg total) by mouth daily. (Patient not taking: Reported on 07/26/2020) 90 tablet 4  . metFORMIN (GLUCOPHAGE) 500 MG tablet TAKE ONE TABLET BY MOUTH TWICE DAILY (Patient not taking: Reported on 07/26/2020) 60 tablet 12  . omeprazole (PRILOSEC) 20 MG capsule Take 1 capsule (20 mg total) by mouth daily. (Patient not taking: Reported on 07/26/2020) 30 capsule 11  . pioglitazone (ACTOS) 30 MG tablet Take 30 mg by mouth daily.  (Patient not taking: Reported on 07/26/2020)  1  . pseudoephedrine-acetaminophen (TYLENOL SINUS) 30-500 MG TABS tablet Take 2 tablets by mouth every 4 (four) hours as needed. Only using twice daily (Patient not taking: Reported on 07/26/2020)    . triamcinolone (NASACORT) 55 MCG/ACT AERO nasal inhaler Place 1 spray into the nose 2 (two) times daily. (Patient not taking: Reported on 02/03/2020) 1 Inhaler 2   No current facility-administered medications for this visit.     PHYSICAL EXAMINATION: ECOG PERFORMANCE STATUS: 2 - Symptomatic, <50% confined to bed Vitals:   07/26/20 1035  BP: (!) 91/55  Pulse: 88  Resp: 12  Temp: (!) 95.2 F (35.1 C)   Filed Weights   07/26/20 1035  Weight: 96 lb 9.6 oz  (43.8 kg)    Physical Exam Constitutional:      General: He is not in acute distress.    Appearance: He is ill-appearing.     Comments: Cachectic  HENT:     Head: Normocephalic and atraumatic.  Eyes:     General: No scleral icterus.    Pupils: Pupils are equal, round, and reactive to light.  Neck:     Comments: No neck swelling Cardiovascular:     Rate and Rhythm: Normal rate and regular rhythm.     Heart sounds: Normal heart sounds.  Pulmonary:     Effort: Pulmonary effort is normal. No respiratory distress.  Breath sounds: No wheezing.     Comments: Decreased breath sounds bilaterally.  Abdominal:     General: Bowel sounds are normal. There is no distension.     Palpations: Abdomen is soft. There is no mass.  Musculoskeletal:        General: No deformity. Normal range of motion.     Cervical back: Normal range of motion and neck supple.     Comments: Clubbing  Skin:    General: Skin is warm and dry.     Findings: No erythema or rash.  Neurological:     General: No focal deficit present.     Mental Status: He is alert and oriented to person, place, and time. Mental status is at baseline.     Cranial Nerves: No cranial nerve deficit.     Coordination: Coordination normal.  Psychiatric:        Mood and Affect: Mood normal.     LABORATORY DATA:  I have reviewed the data as listed Lab Results  Component Value Date   WBC 4.1 07/26/2020   HGB 16.3 07/26/2020   HCT 44.5 07/26/2020   MCV 91.9 07/26/2020   PLT 200 07/26/2020   Recent Labs    09/02/19 0932 01/06/20 1417 01/16/20 0942 07/24/20 1104 07/26/20 1001  NA 135  --  132*  --  130*  K 4.8  --  4.9  --  4.4  CL 101  --  98  --  92*  CO2 26  --  24  --  26  GLUCOSE 149*  --  460*  --  395*  BUN 14  --  13  --  15  CREATININE 0.65   < > 0.80 0.60* 0.77  CALCIUM 9.1  --  9.3  --  9.0  GFRNONAA >60  --  >60  --  >60  GFRAA >60  --  >60  --  >60  PROT 7.3  --  7.7  --  7.1  ALBUMIN 4.1  --  4.4  --   4.0  AST 26  --  30  --  15  ALT 26  --  33  --  12  ALKPHOS 80  --  110  --  93  BILITOT 0.7  --  0.9  --  1.0   < > = values in this interval not displayed.   Iron/TIBC/Ferritin/ %Sat No results found for: IRON, TIBC, FERRITIN, IRONPCTSAT    RADIOGRAPHIC STUDIES: I have personally reviewed the radiological images as listed and agreed with the findings in the report.  CT Chest W Contrast  Result Date: 07/25/2020 CLINICAL DATA:  Non-small cell lung cancer, follow-up EXAM: CT CHEST WITH CONTRAST TECHNIQUE: Multidetector CT imaging of the chest was performed during intravenous contrast administration. CONTRAST:  55m OMNIPAQUE IOHEXOL 300 MG/ML  SOLN COMPARISON:  PET-CT dated 01/23/2020.  CT chest dated 01/06/2020. FINDINGS: Cardiovascular: Heart is normal in size.  No pericardial effusion. Ectasia of the ascending thoracic aorta, measuring 4.0 cm. Atherosclerotic calcifications of the aortic arch. Progressive layering eccentric mural thrombus along the descending thoracic aorta (series 2/image 116). Coronary atherosclerosis of the LAD and right coronary artery. Mediastinum/Nodes: No suspicious mediastinal lymphadenopathy. Visualized thyroid is unremarkable. Lungs/Pleura: 4.8 x 2.6 cm masslike opacity in the medial right upper lobe with volume loss (series 3/image 52), previously 5.1 x 2.7 cm, grossly unchanged. Masslike opacity with architectural distortion in the posterior left upper lobe measures 3.6 x 3.7 cm (series 3/image 39), previously 3.6 x  3.2 cm when remeasured in a similar fashion. Adjacent satellite nodularity. 15 x 10 mm nodular opacity in the superior segment left lower lobe along the fissure (series 3/image 61), previously 11 x 8 mm when remeasured in a similar fashion. Adjacent satellite nodularity. 2.6 x 1.7 cm spiculated nodule in the central left lower lobe (series 3/image 83), previously 1.8 x 1.3 cm when remeasured in a similar fashion. 14 mm irregular nodule in the lateral left  lower lobe (series 3/image 113), previously 10 mm. Additional scattered small bilateral pulmonary nodules, mildly progressive, particularly in the right lower lobe (series 3/images 122 and 123). Underlying severe centrilobular and paraseptal emphysematous changes, upper lung predominant. No focal consolidation. No pleural effusion or pneumothorax. Upper Abdomen: Visualized upper abdomen is grossly unremarkable. Musculoskeletal: Visualized osseous structures are within normal limits. IMPRESSION: 4.8 cm masslike opacity in the medial right upper lobe is grossly unchanged. Additional bilateral pulmonary nodules/masses have progressed, including the dominant 3.7 cm mass in the left upper lobe. Ectasia of the ascending thoracic aorta, measuring 4.0 cm. Attention on follow-up is suggested. Aortic Atherosclerosis (ICD10-I70.0) and Emphysema (ICD10-J43.9). Electronically Signed   By: Julian Hy M.D.   On: 07/25/2020 07:37      ASSESSMENT & PLAN:  1. Malignant neoplasm of lung, unspecified laterality, unspecified part of lung ([place])   2. Goals of care, counseling/discussion   3. Uncontrolled type 2 diabetes mellitus with hyperglycemia ([place])   4. Protein-calorie malnutrition, unspecified severity ([place])   5. Weight loss, unintentional   6. Hypotension due to hypovolemia   7. Hyponatremia    #Non small cell lung carcinoma, status post SBRT to right upper lobe lung cancer. Interval CT chest Images were independently reviewed 4.8 cm masslike opacity in the medial right upper lobe is grossly unchanged.  Additional bilateral pulmonary nodules/masses have progressed, including the dominant 3.7 cm mass in the left upper lobe. Ectasia of the ascending thoracic aorta, measuring 4.0 cm. Discussed with patient daughter that CT image finding is consistent with lung cancer progression. Goals of care were discussed with patient. He has previously declined any treatment. Today he is interested in proceeding with a  PET scan for further evaluation and MRI for restaging. 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 Options of systemic treatment with afatinib and in the future may be single agent immunotherapy were discussed with patient. He is interested in afatinib treatment.  Rationale and potential side effects were discussed with patient. We will finalize his plan after PET scan and MRIs done Goals of care was discussed with the patient and daughter.  Palliative intent.  He understands that his condition is not curable.  Severe protein calorie malnutrition.weight loss Refer to follow-up with nutritionist.  Last A1c was done on 12/28/2019 which was 12.3 patient is on Lantus, Metformin, Actos, Recommend nutrition supplementation Glucerna. Add Megace 457m daily.   #Hypotension, likely secondary to poor oral intake.  Patient will receive 1 L of IV fluid normal saline today. #Hyponatremia, possible due to poor oral intake versus SIADH.  Follow-up in 6 months Orders Placed This Encounter  Procedures  . NM PET Image Restag (PS) Skull Base To Thigh    Standing Status:   Future    Standing Expiration Date:   07/26/2021    Order Specific Question:   If indicated for the ordered procedure, I authorize the administration of a radiopharmaceutical per Radiology protocol    Answer:   Yes    Order Specific Question:   Preferred imaging  location?    Answer:   [facility]  . MR Brain W Wo Contrast    Standing Status:   Future    Standing Expiration Date:   07/26/2021    Order Specific Question:   If indicated for the ordered procedure, I authorize the administration of contrast media per Radiology protocol    Answer:   Yes    Order Specific Question:   What is the patient's sedation requirement?    Answer:   No Sedation    Order Specific Question:   Does the patient have a pacemaker or implanted devices?    Answer:   No    Order Specific Question:   Use SRS Protocol?    Answer:   Yes     Order Specific Question:   Preferred imaging location?    Answer:   [facility] (table limit - 550lbs)    All questions were answered. The patient knows to call the clinic with any problems questions or concerns.  Return of visit: To be determined Earlie Server, MD, PhD Hematology Oncology [facility] Pager- [phone] 07/26/2020

## 2020-07-26 NOTE — Progress Notes (Signed)
DISCONTINUE ON PATHWAY REGIMEN - Non-Small Cell Lung     A cycle is every 21 days:     Pemetrexed      Carboplatin   **Always confirm dose/schedule in your pharmacy ordering system**  REASON: Change In Patient Status PRIOR TREATMENT: LOS400: Carboplatin AUC=5 + Pemetrexed 500 mg/m2 q21 Days x 1 Cycle  START ON PATHWAY REGIMEN - Non-Small Cell Lung     Daily:     Afatinib   **Always confirm dose/schedule in your pharmacy ordering system**  Patient Characteristics: Stage IV Metastatic, Nonsquamous, Initial Molecular Targeted Therapy, EGFR Mutation -  Uncommon (S768I, L861Q, and G719X) Therapeutic Status: Stage IV Metastatic Histology: Nonsquamous Cell ROS1 Rearrangement Status: Negative Other Mutations/Biomarkers: No Other Actionable Mutations Chemotherapy/Immunotherapy LOT: Not Appropriate Molecular Targeted Therapy: Initial Molecular Targeted Therapy KRAS G12C Mutation Status: Negative MET Exon 14 Mutation Status: Awaiting Test Results RET Gene Fusion Status: Negative EGFR Mutation Status: Positive - Uncommon (S768I, L861Q, and G719X) NTRK Gene Fusion Status: Negative PD-L1 Expression Status: PD-L1 Positive 1-49% (TPS) ALK Rearrangement Status: Negative BRAF V600E Mutation Status: Negative Intent of Therapy: Non-Curative / Palliative Intent, Discussed with Patient

## 2020-07-26 NOTE — Progress Notes (Signed)
Radiation Oncology Follow up Note  Name: Colin Novak   Date:   07/26/2020 MRN:  [id number] DOB: 14-Aug-1946    This 74 y.o. male presents to the clinic today for 56-month follow-up status post palliative radiation therapy to his right upper lobe.  REFERRING PROVIDER: Guadalupe Maple, MD  HPI: Patient is a 74 year old male now at 10 months having completed concurrent chemoradiation therapy and split course fashion for stage III non-small cell lung cancer of the right upper lobe.. Patient is currently under palliative care with most recent CT scan showing masslike opacity in the medial right upper lobe grossly unchanged does have bilateral pulmonary nodules which are progressing with a 3.7 center mass in the left upper lobe.  He had a brain MRI back in March showing no evidence of intracranial disease.  PET CT scan back also in March showed reduced size and activity of dominant left upper lobe mass and dominant right upper lobe lesions he is seen today and is doing poor.  He currently is under observation and recommendation for home-based palliative care has been made.  COMPLICATIONS OF TREATMENT: none  FOLLOW UP COMPLIANCE: keeps appointments   PHYSICAL EXAM:  There were no vitals taken for this visit. Mechele Claude hectic male wheelchair-bound in NAD.  Well-developed well-nourished patient in NAD. HEENT reveals PERLA, EOMI, discs not visualized.  Oral cavity is clear. No oral mucosal lesions are identified. Neck is clear without evidence of cervical or supraclavicular adenopathy. Lungs are clear to A&P. Cardiac examination is essentially unremarkable with regular rate and rhythm without murmur rub or thrill. Abdomen is benign with no organomegaly or masses noted. Motor sensory and DTR levels are equal and symmetric in the upper and lower extremities. Cranial nerves II through XII are grossly intact. Proprioception is intact. No peripheral adenopathy or edema is identified. No motor or sensory  levels are noted. Crude visual fields are within normal range.  RADIOLOGY RESULTS: PET scan MRI brain as well as CT scans all reviewed compatible with above-stated findings  PLAN: The present time patient is under palliative care.  I would be happy to reevaluate him at any time should palliative radiation therapy be indicated.  Patient knows to call with any concerns.  He continues observation with medical oncology.  I would like to take this opportunity to thank you for allowing me to participate in the care of your patient.Noreene Filbert, MD

## 2020-07-26 NOTE — Progress Notes (Signed)
Patient is feeling lightheaded with bp 91/55 today.  Also reports leg weakness with tingling sensation.  Eats 2 meals a day with an occasional Glucerna meal replacement, is down 20 lbs since last documented weight.

## 2020-07-27 ENCOUNTER — Telehealth: Payer: Self-pay | Admitting: Pharmacist

## 2020-07-27 ENCOUNTER — Telehealth: Payer: Self-pay | Admitting: Pharmacy Technician

## 2020-07-27 ENCOUNTER — Telehealth: Payer: Self-pay

## 2020-07-27 DIAGNOSIS — C3480 Malignant neoplasm of overlapping sites of unspecified bronchus and lung: Secondary | ICD-10-CM

## 2020-07-27 NOTE — Telephone Encounter (Signed)
Please schedule as requested, I will contact daughter/patient with appt details.

## 2020-07-27 NOTE — Telephone Encounter (Signed)
Oral Oncology Patient Advocate Encounter  Prior Authorization for [place] [place]ley has been approved.    No PA number given Effective dates: 07/27/20 through 07/27/21  Patients co-pay is $9.20  Oral Oncology Clinic will continue to follow.   [place] Patient [place] Phone [phone] Fax [phone] 07/30/2020 8:36 AM

## 2020-07-27 NOTE — Telephone Encounter (Signed)
-----   Message from Earlie Server, MD sent at 07/26/2020  8:32 PM EDT ----- Please let her daughter know that I also ordered a medication -megace to help his appetite. Rx was sent to pharmacy. Please arrange him to see me next Tuesday for lab -BMP only, MD +/- IV fluid +/- 1 day is ok.

## 2020-07-27 NOTE — Telephone Encounter (Signed)
Patient's daughter, [name], notified of medication and appts.

## 2020-07-27 NOTE — Telephone Encounter (Signed)
Oral Oncology Patient Advocate Encounter  Received notification from [facility] Part D that prior authorization for Gilot[place] is required.  PA submitted on CoverMyMeds Key [id number]  Status is pending  Oral Oncology Clinic will continue to follow.  [place] Patient [place] Phone [phone] Fax [phone] 07/27/2020 3:04 PM

## 2020-07-27 NOTE — Telephone Encounter (Signed)
Done... Pt is scheduled to RTC on 07/31/20..  07/31/20 lab @ 11:00 -BMP/ MD @ 11:00/ (11:30+/- IV fluid has to be sched after 11:00 in Infusion).

## 2020-07-27 NOTE — Telephone Encounter (Signed)
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  CMP from 07/26/20 assessed, no relevant lab abnormalities. Prescription dose and frequency assessed.   Current medication list in Epic reviewed, no DDIs with afatinib identified.  Prescription has been e-scribed to the [facility] for benefits analysis and approval.  Oral Oncology Clinic will continue to follow for insurance authorization, copayment issues, initial counseling and start date.  Darl Pikes, PharmD, BCPS, BCOP, CPP Hematology/Oncology Clinical Pharmacist Practitioner [facility] [phone]  07/27/2020 1:07 PM

## 2020-07-27 NOTE — Telephone Encounter (Signed)
PA submitted and approved for Megace.  Approval faxed to pharmacy

## 2020-07-30 ENCOUNTER — Other Ambulatory Visit: Payer: Self-pay

## 2020-07-30 DIAGNOSIS — C3490 Malignant neoplasm of unspecified part of unspecified bronchus or lung: Secondary | ICD-10-CM

## 2020-07-30 MED ORDER — AFATINIB DIMALEATE 40 MG PO TABS: 40.0000 mg | ORAL_TABLET | Freq: Every day | ORAL | 0 refills | Status: DC

## 2020-07-31 ENCOUNTER — Other Ambulatory Visit: Payer: Self-pay

## 2020-07-31 ENCOUNTER — Telehealth: Payer: Medicare Other | Admitting: Hospice and Palliative Medicine

## 2020-07-31 ENCOUNTER — Inpatient Hospital Stay (HOSPITAL_BASED_OUTPATIENT_CLINIC_OR_DEPARTMENT_OTHER): Payer: Medicare Other | Admitting: Oncology

## 2020-07-31 ENCOUNTER — Inpatient Hospital Stay: Payer: Medicare Other | Attending: Oncology

## 2020-07-31 ENCOUNTER — Inpatient Hospital Stay: Payer: Medicare Other

## 2020-07-31 ENCOUNTER — Encounter: Payer: Self-pay | Admitting: Oncology

## 2020-07-31 ENCOUNTER — Other Ambulatory Visit (HOSPITAL_COMMUNITY): Payer: Self-pay | Admitting: Oncology

## 2020-07-31 VITALS — BP 94/54 | HR 84 | Temp 96.3°F | Resp 18

## 2020-07-31 DIAGNOSIS — C3490 Malignant neoplasm of unspecified part of unspecified bronchus or lung: Secondary | ICD-10-CM

## 2020-07-31 DIAGNOSIS — Z15068 Genetic susceptibility to other malignant neoplasm of digestive system: Secondary | ICD-10-CM

## 2020-07-31 DIAGNOSIS — Z7982 Long term (current) use of aspirin: Secondary | ICD-10-CM | POA: Insufficient documentation

## 2020-07-31 DIAGNOSIS — Z794 Long term (current) use of insulin: Secondary | ICD-10-CM | POA: Diagnosis not present

## 2020-07-31 DIAGNOSIS — K219 Gastro-esophageal reflux disease without esophagitis: Secondary | ICD-10-CM | POA: Diagnosis not present

## 2020-07-31 DIAGNOSIS — E43 Unspecified severe protein-calorie malnutrition: Secondary | ICD-10-CM | POA: Insufficient documentation

## 2020-07-31 DIAGNOSIS — Z7984 Long term (current) use of oral hypoglycemic drugs: Secondary | ICD-10-CM | POA: Diagnosis not present

## 2020-07-31 DIAGNOSIS — E861 Hypovolemia: Secondary | ICD-10-CM | POA: Diagnosis not present

## 2020-07-31 DIAGNOSIS — E871 Hypo-osmolality and hyponatremia: Secondary | ICD-10-CM | POA: Diagnosis not present

## 2020-07-31 DIAGNOSIS — Z681 Body mass index (BMI) 19 or less, adult: Secondary | ICD-10-CM | POA: Diagnosis not present

## 2020-07-31 DIAGNOSIS — E1136 Type 2 diabetes mellitus with diabetic cataract: Secondary | ICD-10-CM | POA: Insufficient documentation

## 2020-07-31 DIAGNOSIS — E46 Unspecified protein-calorie malnutrition: Secondary | ICD-10-CM

## 2020-07-31 DIAGNOSIS — Z79899 Other long term (current) drug therapy: Secondary | ICD-10-CM | POA: Insufficient documentation

## 2020-07-31 DIAGNOSIS — C3480 Malignant neoplasm of overlapping sites of unspecified bronchus and lung: Secondary | ICD-10-CM | POA: Diagnosis not present

## 2020-07-31 DIAGNOSIS — R634 Abnormal weight loss: Secondary | ICD-10-CM | POA: Insufficient documentation

## 2020-07-31 DIAGNOSIS — Z66 Do not resuscitate: Secondary | ICD-10-CM | POA: Diagnosis not present

## 2020-07-31 DIAGNOSIS — E785 Hyperlipidemia, unspecified: Secondary | ICD-10-CM | POA: Diagnosis not present

## 2020-07-31 DIAGNOSIS — J449 Chronic obstructive pulmonary disease, unspecified: Secondary | ICD-10-CM | POA: Diagnosis not present

## 2020-07-31 DIAGNOSIS — Z515 Encounter for palliative care: Secondary | ICD-10-CM | POA: Diagnosis not present

## 2020-07-31 DIAGNOSIS — Z5111 Encounter for antineoplastic chemotherapy: Secondary | ICD-10-CM

## 2020-07-31 DIAGNOSIS — F17210 Nicotine dependence, cigarettes, uncomplicated: Secondary | ICD-10-CM | POA: Diagnosis not present

## 2020-07-31 DIAGNOSIS — R197 Diarrhea, unspecified: Secondary | ICD-10-CM | POA: Insufficient documentation

## 2020-07-31 DIAGNOSIS — Z23 Encounter for immunization: Secondary | ICD-10-CM

## 2020-07-31 DIAGNOSIS — I9589 Other hypotension: Secondary | ICD-10-CM

## 2020-07-31 LAB — BASIC METABOLIC PANEL
Anion gap: 9 (ref 5–15)
BUN: 11 mg/dL (ref 8–23)
CO2: 28 mmol/L (ref 22–32)
Calcium: 8.9 mg/dL (ref 8.9–10.3)
Chloride: 102 mmol/L (ref 98–111)
Creatinine, Ser: 0.55 mg/dL — ABNORMAL LOW (ref 0.61–1.24)
GFR calc non Af Amer: >60 mL/min (ref >60)
Glucose, Bld: 132 mg/dL — ABNORMAL HIGH (ref 70–99)
Potassium: 4.3 mmol/L (ref 3.5–5.1)
Sodium: 139 mmol/L (ref 135–145)

## 2020-07-31 MED ORDER — AFATINIB DIMALEATE 30 MG PO TABS: 30.0000 mg | ORAL_TABLET | Freq: Every day | ORAL | 0 refills | Status: DC

## 2020-07-31 MED ORDER — SODIUM CHLORIDE 0.9 % IV SOLN
Freq: Once | INTRAVENOUS | Status: AC
  Filled 2020-07-31: qty 250

## 2020-07-31 MED ORDER — INFLUENZA VAC A&B SA ADJ QUAD 0.5 ML IM PRSY
0.5000 mL | PREFILLED_SYRINGE | Freq: Once | INTRAMUSCULAR | Status: AC
  Administered 2020-07-31: 0.5 mL via INTRAMUSCULAR
  Filled 2020-07-31: qty 0.5

## 2020-07-31 MED FILL — GILOTRIF 30 MG TABLET: 30 | 30 days supply | Qty: 30 | Fill #0

## 2020-07-31 NOTE — Progress Notes (Signed)
Patient reports new bilateral collar bone pain that started this morning.  Would like to know what he can take for his chronic knee pain.  Does have a chronic cough and takes OTC medication.

## 2020-07-31 NOTE — Progress Notes (Signed)
Hematology/Oncology follow up note [facility] Telephone:([phone]) [phone] Fax:([phone]) [phone]   Patient Care Team: Guadalupe Maple, MD as PCP - General (Family Medicine) Gabriel Carina, Betsey Holiday, MD as Physician Assistant (Endocrinology) Kem Parkinson, MD (Ophthalmology) Telford Nab, RN as Registered Nurse Tamala Julian, Jonette Eva, NP as Nurse Practitioner Noreene Filbert, MD as Radiation Oncologist (Radiation Oncology)  REFERRING PROVIDER: Guadalupe Maple, MD  CHIEF COMPLAINTS/REASON FOR VISIT:  Follow up for non-small cell lung cancer  HISTORY OF PRESENTING ILLNESS:  Colin Novak is a  74 y.o.  male with PMH listed below was seen in consultation at the request of  Guadalupe Maple, MD  for evaluation of lung mass Patient reports ongoing shortness of breath, cough for  few months. He had CT chest scan done on 06/01/2019 which showed a concerning feature of 9.1 x 5.1 x 3.6 cm right upper lobe mass extending into mediastinum, encasing and occluding right upper lobe bronchus and some of the right upper lobe bronchial branches.  There is also significant narrowing of FVC. He also had a similar appearance of left upper lobe mass without mediastinal extension suspicious for a synchronized primary lung cancer Multiple additional smaller bilateral lung nodules, left greater than right. Mildly enlarged right hilar and mediastinal lymph nodes suspicious for metastatic lymphadenopathy. 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 Daughter [name] is his power of attorney. Denies fever or chills.  # patient was seen by me on 06/20/2019.  At that time, patient has 2 separate lung tumor.  Possibility of stage IV lung adenocarcinoma TX N1 M1 discussed with patient given that he has tumor in the contralateral lobe.  Systemic chemotherapy with immunotherapy was recommended.  Patient declined at  that time. He may have 2 separate primary lung cancer, if that is the case, he has stage III lung cancer.  would benefit from definitive concurrent chemoradiation followed by immunotherapy for maintenance. Patient has decided at that time that he does not want any systemic chemotherapy.  He underwent SBRT to right upper lobe lesion.  Repeat CT scan showed interval increase of the right para mediastinal and hilar mass and also multiple areas of increased density in the left lower lobe consistent with multifocal adenocarcinoma/metastasis.  #01/23/2020, PET scan showed a decreased left upper lobe and right upper lobe lesion. There is still occlusion of the right upper lobe bronchus with some associated atelectasis.Enlarged left lung nodules have not had increased metabolic activity and remained well below blood pool. New sclerosis in the upper sternal body comparing to previous PET scan. -Patient was not interested in any systemic treatment.  Declined immunotherapy maintenance.  NGS was sent.  he follows up with palliative care service.  INTERVAL HISTORY Colin Novak is a 74 y.o. male who has above history reviewed by me today presents for follow up visit for management of  non-small cell lung cancer. Problems and complaints are listed below: He feels better comparing to his last visit. Improved after hydration session.  He drinks nutrition supplements.  PET and MRI brain are scheduled.  He has gained weight since last visit.    Review of Systems  Constitutional: Positive for fatigue and unexpected weight change. Negative for appetite change, chills, diaphoresis and fever.  HENT:   Negative for hearing loss, lump/mass, nosebleeds, sore throat and voice change.   Eyes: Negative for eye problems and icterus.  Respiratory: Positive for cough and shortness  of breath. Negative for chest tightness, hemoptysis and wheezing.   Cardiovascular: Negative for chest pain and leg swelling.    Gastrointestinal: Negative for abdominal distention, abdominal pain, blood in stool, diarrhea, nausea and rectal pain.  Endocrine: Negative for hot flashes.  Genitourinary: Negative for bladder incontinence, difficulty urinating, dysuria, frequency, hematuria and nocturia.   Musculoskeletal: Positive for back pain. Negative for arthralgias, flank pain, gait problem and myalgias.  Skin: Negative for itching and rash.  Neurological: Negative for dizziness, gait problem, headaches, light-headedness, numbness and seizures.  Hematological: Negative for adenopathy. Does not bruise/bleed easily.  Psychiatric/Behavioral: Negative for confusion and decreased concentration. The patient is not nervous/anxious.     MEDICAL HISTORY:  Past Medical History:  Diagnosis Date  . Anxiety   . COPD (chronic obstructive pulmonary disease) ([place])   . Diabetes mellitus without complication ([place])    type 2  . GERD (gastroesophageal reflux disease)   . Hyperlipidemia     SURGICAL HISTORY: Past Surgical History:  Procedure Laterality Date  . APPENDECTOMY    . ENDOBRONCHIAL ULTRASOUND Bilateral 06/13/2019   Procedure: ENDOBRONCHIAL ULTRASOUND, BILATERAL;  Surgeon: [name], MD;  Location: [facility] ORS;  Service: Pulmonary;  Laterality: Bilateral;  . EYE SURGERY Bilateral    cataract    SOCIAL HISTORY: Social History   Socioeconomic History  . Marital status: Widowed    Spouse name: Not on file  . Number of children: 1  . Years of education: Not on file  . Highest education level: High school graduate  Occupational History  . Occupation: retired  Tobacco Use  . Smoking status: Current Every Day Smoker    Packs/day: 1.00    Types: Cigarettes  . Smokeless tobacco: Former Systems developer    Types: Chew    Quit date: 06/10/1959  . Tobacco comment: 1 pack daily- 06/09/2019  Vaping Use  . Vaping Use: Never used  Substance and Sexual Activity  . Alcohol use: No  . Drug use: No  . Sexual activity: Not  Currently  Other Topics Concern  . Not on file  Social History Narrative  . Not on file   Social Determinants of Health   Financial Resource Strain:   . Difficulty of Paying Living Expenses: Not on file  Food Insecurity:   . Worried About Charity fundraiser in the Last Year: Not on file  . Ran Out of Food in the Last Year: Not on file  Transportation Needs:   . Lack of Transportation (Medical): Not on file  . Lack of Transportation (Non-Medical): Not on file  Physical Activity:   . Days of Exercise per Week: Not on file  . Minutes of Exercise per Session: Not on file  Stress:   . Feeling of Stress : Not on file  Social Connections:   . Frequency of Communication with Friends and Family: Not on file  . Frequency of Social Gatherings with Friends and Family: Not on file  . Attends Religious Services: Not on file  . Active Member of Clubs or Organizations: Not on file  . Attends Archivist Meetings: Not on file  . Marital Status: Not on file  Intimate Partner Violence:   . Fear of Current or Ex-Partner: Not on file  . Emotionally Abused: Not on file  . Physically Abused: Not on file  . Sexually Abused: Not on file    FAMILY HISTORY: Family History  Problem Relation Age of Onset  . Cancer Mother  lung (non smoker)  . Diabetes Father   . Hypertension Father   . Lymphoma Brother   . Prostate cancer Brother     ALLERGIES:  is allergic to penicillins.  MEDICATIONS:  Current Outpatient Medications  Medication Sig Dispense Refill  . albuterol (PROAIR HFA) 108 (90 Base) MCG/ACT inhaler Inhale 1-2 puffs into the lungs every 6 (six) hours as needed for wheezing or shortness of breath (use as needed for chest congestion.). 1 g 3  . aspirin EC 81 MG tablet Take 81 mg by mouth daily.    Marland Kitchen Dextromethorphan-guaiFENesin (ROBITUSSIN DM PO) Take 5 mLs by mouth at bedtime.     Marland Kitchen ezetimibe-simvastatin (VYTORIN) 10-40 MG tablet Take 1 tablet by mouth daily. 90 tablet 4    . Fluticasone-Umeclidin-Vilant (TRELEGY ELLIPTA) 100-62.5-25 MCG/INH AEPB Inhale 1 applicator into the lungs daily. Rinse mouth after use. 1 each 10  . glucose blood (ONE TOUCH ULTRA TEST) test strip 1 each by Other route 2 (two) times daily as needed for other. Dx: E11.9 100 each 12  . HUMALOG KWIKPEN 100 UNIT/ML KwikPen 16 Units. Sliding scale up to 12 units    . LANTUS SOLOSTAR 100 UNIT/ML Solostar Pen INJECT 10-20 UNITS INTO THE SKIN DAILY 5 pen 12  . Multiple Vitamin (MULTIVITAMIN) tablet Take 1 tablet by mouth daily.    Marland Kitchen NOVOFINE 32G X 6 MM MISC USE AS DIRECTED. 5 INJECTIONS A DAY 900 each 0  . oxymetazoline (AFRIN) 0.05 % nasal spray Place 2 sprays into both nostrils 2 (two) times daily as needed.     Marland Kitchen afatinib dimaleate (GILOTRIF) 30 MG tablet Take 1 tablet (30 mg total) by mouth daily. Take on an empty stomach 1hr before or 2hrs after meals. 30 tablet 0  . citalopram (CELEXA) 20 MG tablet Take 1 tablet (20 mg total) by mouth daily. (Patient not taking: Reported on 07/26/2020) 90 tablet 4  . megestrol (MEGACE) 400 MG/10ML suspension Take 10 mLs (400 mg total) by mouth daily. (Patient not taking: Reported on 07/31/2020) 240 mL 0  . metFORMIN (GLUCOPHAGE) 500 MG tablet TAKE ONE TABLET BY MOUTH TWICE DAILY (Patient not taking: Reported on 07/26/2020) 60 tablet 12  . omeprazole (PRILOSEC) 20 MG capsule Take 1 capsule (20 mg total) by mouth daily. (Patient not taking: Reported on 07/26/2020) 30 capsule 11  . pioglitazone (ACTOS) 30 MG tablet Take 30 mg by mouth daily.  (Patient not taking: Reported on 07/26/2020)  1  . pseudoephedrine-acetaminophen (TYLENOL SINUS) 30-500 MG TABS tablet Take 2 tablets by mouth every 4 (four) hours as needed. Only using twice daily (Patient not taking: Reported on 07/31/2020)    . triamcinolone (NASACORT) 55 MCG/ACT AERO nasal inhaler Place 1 spray into the nose 2 (two) times daily. (Patient not taking: Reported on 02/03/2020) 1 Inhaler 2   No current  facility-administered medications for this visit.     PHYSICAL EXAMINATION: ECOG PERFORMANCE STATUS: 2 - Symptomatic, <50% confined to bed Vitals:   07/31/20 1115  BP: (!) 94/54  Pulse: 84  Resp: 18  Temp: (!) 96.3 F (35.7 C)  SpO2: 98%   There were no vitals filed for this visit.  Physical Exam Constitutional:      General: He is not in acute distress.    Appearance: He is ill-appearing.     Comments: Cachectic  HENT:     Head: Normocephalic and atraumatic.  Eyes:     General: No scleral icterus.    Pupils: Pupils are equal, round, and  reactive to light.  Neck:     Comments: No neck swelling Cardiovascular:     Rate and Rhythm: Normal rate and regular rhythm.     Heart sounds: Normal heart sounds.  Pulmonary:     Effort: Pulmonary effort is normal. No respiratory distress.     Breath sounds: No wheezing.     Comments: Decreased breath sounds bilaterally.  Abdominal:     General: Bowel sounds are normal. There is no distension.     Palpations: Abdomen is soft. There is no mass.  Musculoskeletal:        General: No deformity. Normal range of motion.     Cervical back: Normal range of motion and neck supple.     Comments: Clubbing  Skin:    General: Skin is warm and dry.     Findings: No erythema or rash.  Neurological:     General: No focal deficit present.     Mental Status: He is alert and oriented to person, place, and time. Mental status is at baseline.     Cranial Nerves: No cranial nerve deficit.     Coordination: Coordination normal.  Psychiatric:        Mood and Affect: Mood normal.     LABORATORY DATA:  I have reviewed the data as listed Lab Results  Component Value Date   WBC 4.1 07/26/2020   HGB 16.3 07/26/2020   HCT 44.5 07/26/2020   MCV 91.9 07/26/2020   PLT 200 07/26/2020   Recent Labs    09/02/19 0932 01/06/20 1417 01/16/20 0942 01/16/20 0942 07/24/20 1104 07/26/20 1001 07/31/20 1059  NA 135  --  132*  --   --  130* 139  K 4.8   --  4.9  --   --  4.4 4.3  CL 101  --  98  --   --  92* 102  CO2 26  --  24  --   --  26 28  GLUCOSE 149*  --  460*  --   --  395* 132*  BUN 14  --  13  --   --  15 11  CREATININE 0.65   < > 0.80   < > 0.60* 0.77 0.55*  CALCIUM 9.1  --  9.3  --   --  9.0 8.9  GFRNONAA >60  --  >60  --   --  >60 >60  GFRAA >60  --  >60  --   --  >60  --   PROT 7.3  --  7.7  --   --  7.1  --   ALBUMIN 4.1  --  4.4  --   --  4.0  --   AST 26  --  30  --   --  15  --   ALT 26  --  33  --   --  12  --   ALKPHOS 80  --  110  --   --  93  --   BILITOT 0.7  --  0.9  --   --  1.0  --    < > = values in this interval not displayed.   Iron/TIBC/Ferritin/ %Sat No results found for: IRON, TIBC, FERRITIN, IRONPCTSAT    RADIOGRAPHIC STUDIES: I have personally reviewed the radiological images as listed and agreed with the findings in the report.  CT Chest W Contrast  Result Date: 07/25/2020 CLINICAL DATA:  Non-small cell lung cancer, follow-up EXAM: CT CHEST WITH CONTRAST TECHNIQUE:  Multidetector CT imaging of the chest was performed during intravenous contrast administration. CONTRAST:  35m OMNIPAQUE IOHEXOL 300 MG/ML  SOLN COMPARISON:  PET-CT dated 01/23/2020.  CT chest dated 01/06/2020. FINDINGS: Cardiovascular: Heart is normal in size.  No pericardial effusion. Ectasia of the ascending thoracic aorta, measuring 4.0 cm. Atherosclerotic calcifications of the aortic arch. Progressive layering eccentric mural thrombus along the descending thoracic aorta (series 2/image 116). Coronary atherosclerosis of the LAD and right coronary artery. Mediastinum/Nodes: No suspicious mediastinal lymphadenopathy. Visualized thyroid is unremarkable. Lungs/Pleura: 4.8 x 2.6 cm masslike opacity in the medial right upper lobe with volume loss (series 3/image 52), previously 5.1 x 2.7 cm, grossly unchanged. Masslike opacity with architectural distortion in the posterior left upper lobe measures 3.6 x 3.7 cm (series 3/image 39), previously  3.6 x 3.2 cm when remeasured in a similar fashion. Adjacent satellite nodularity. 15 x 10 mm nodular opacity in the superior segment left lower lobe along the fissure (series 3/image 61), previously 11 x 8 mm when remeasured in a similar fashion. Adjacent satellite nodularity. 2.6 x 1.7 cm spiculated nodule in the central left lower lobe (series 3/image 83), previously 1.8 x 1.3 cm when remeasured in a similar fashion. 14 mm irregular nodule in the lateral left lower lobe (series 3/image 113), previously 10 mm. Additional scattered small bilateral pulmonary nodules, mildly progressive, particularly in the right lower lobe (series 3/images 122 and 123). Underlying severe centrilobular and paraseptal emphysematous changes, upper lung predominant. No focal consolidation. No pleural effusion or pneumothorax. Upper Abdomen: Visualized upper abdomen is grossly unremarkable. Musculoskeletal: Visualized osseous structures are within normal limits. IMPRESSION: 4.8 cm masslike opacity in the medial right upper lobe is grossly unchanged. Additional bilateral pulmonary nodules/masses have progressed, including the dominant 3.7 cm mass in the left upper lobe. Ectasia of the ascending thoracic aorta, measuring 4.0 cm. Attention on follow-up is suggested. Aortic Atherosclerosis (ICD10-I70.0) and Emphysema (ICD10-J43.9). Electronically Signed   By: SJulian HyM.D.   On: 07/25/2020 07:37      ASSESSMENT & PLAN:  1. Malignant neoplasm of overlapping sites of lung, unspecified laterality ([place]   2. Protein-calorie malnutrition, unspecified severity ([place]   3. Hypotension due to hypovolemia   4. NSCLC with EGFR mutation ([place]   5. Encounter for antineoplastic chemotherapy    #Non small cell lung carcinoma, status post SBRT to right upper lobe lung cancer. Interval CT chest Images were independently reviewed 4.8 cm masslike opacity in the medial right upper lobe is grossly unchanged.  Additional bilateral pulmonary  nodules/masses have progressed, including the dominant 3.7 cm mass in the left upper lobe. Ectasia of the ascending thoracic aorta, measuring 4.0 cm. Discussed with patient daughter that CT image finding is consistent with lung cancer progression. Goals of care were discussed with patient. He has previously declined any treatment. Today he is interested in proceeding with a PET scan for further evaluation and MRI for restaging. 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 Labs are reviewed and discussed with patient. Clinically he has improved slightly.  Hypotension due to hypovolemia: Proceed with IV NS x 1 today.  Recommend Afatanib, will start with reduced dose of 351mdaily. Reviewed the side effects and he agrees. He and daughter will further have discussion with pharmacist for chemo education.  PET scan and MRI brain pending.   Severe protein calorie malnutrition.weight loss Refer to follow-up with nutritionist.  Last A1c was done on 12/28/2019 which was 12.3 patient is on Lantus, Metformin, Continue nutrition supplementation  Glucerna. Add Megace 463m daily. He is not taking medication.   #Hyponatremia, possible due to poor oral intake versus SIADH.  Follow-up in  1 week All questions were answered. The patient knows to call the clinic with any problems questions or concerns.  ZEarlie Server MD, PhD Hematology Oncology [facility]at A[facility] LPPager- [phone]/02/2020

## 2020-07-31 NOTE — Telephone Encounter (Signed)
Oral Chemotherapy Pharmacist Encounter  [place] will deliver medication on 08/01/20. He knows to get started once medication is in hand.  Patient Education I spoke with patient in infusion and his daughter [name] in the lobby for overview of new oral chemotherapy medication: Gilotrif (afatinib) for the treatment of NSCLC EGFR mutation positive (G719S/R776H mutation, an uncommon EGFR mutation), planned duration until disease progression or unacceptable drug toxicity.   Counseled patient on administration, dosing, side effects, monitoring, drug-food interactions, safe handling, storage, and disposal. Patient will take 1 tablet (30 mg total) by mouth daily. Take on an empty stomach 1hr before or 2hrs after meals.  Side effects include but not limited to: diarrhea, rash, acne-like rash, N/V, fatigue.  His daughter plans on keeping loperamide on hand and using it at the first sign of diarrhea.    Reviewed with patient importance of keeping a medication schedule and plan for any missed doses.  After discussion with patient no patient barriers to medication adherence identified. [name] helps him with hi medication.  Mr. Crihfield and [name] voiced understanding and appreciation. All questions answered. Medication handout provided.  Provided patient with [facility] phone number. Patient knows to call the office with questions or concerns. Oral Chemotherapy Navigation Clinic will continue to follow.  Darl Pikes, PharmD, BCPS, BCOP, CPP Hematology/Oncology Clinical Pharmacist Practitioner [facility] [place] [facility] [phone]  07/31/2020 2:13 PM

## 2020-08-01 NOTE — Telephone Encounter (Signed)
Oral Oncology Patient Advocate Encounter  I spoke with [name], patients daughter, on 07/31/20 to set up delivery of Gilotrif.  Address verified for shipment.  Gilotrif will be filled through [facility] and mailed 07/31/20 for delivery 08/01/20.    [place] will call 7-10 days before next refill is due to complete adherence call and set up delivery of medication.     [place] [facility] [place] Phone [phone] Fax [phone] 08/01/2020 11:14 AM

## 2020-08-02 ENCOUNTER — Other Ambulatory Visit: Payer: Self-pay

## 2020-08-02 ENCOUNTER — Encounter
Admission: RE | Admit: 2020-08-02 | Discharge: 2020-08-02 | Disposition: A | Discharge status: Home / Self Care | Payer: Medicare Other | Source: Ambulatory Visit | Attending: Oncology | Admitting: Oncology

## 2020-08-02 ENCOUNTER — Telehealth: Payer: Medicare Other | Admitting: Hospice and Palliative Medicine

## 2020-08-02 DIAGNOSIS — I2510 Atherosclerotic heart disease of native coronary artery without angina pectoris: Secondary | ICD-10-CM | POA: Diagnosis not present

## 2020-08-02 DIAGNOSIS — J439 Emphysema, unspecified: Secondary | ICD-10-CM | POA: Diagnosis not present

## 2020-08-02 DIAGNOSIS — N400 Enlarged prostate without lower urinary tract symptoms: Secondary | ICD-10-CM | POA: Insufficient documentation

## 2020-08-02 DIAGNOSIS — I700 Atherosclerosis of aorta: Secondary | ICD-10-CM | POA: Insufficient documentation

## 2020-08-02 DIAGNOSIS — I77810 Thoracic aortic ectasia: Secondary | ICD-10-CM | POA: Diagnosis not present

## 2020-08-02 DIAGNOSIS — C3490 Malignant neoplasm of unspecified part of unspecified bronchus or lung: Secondary | ICD-10-CM | POA: Diagnosis not present

## 2020-08-02 LAB — GLUCOSE, CAPILLARY: Glucose-Capillary: 242 mg/dL — ABNORMAL HIGH (ref 70–99)

## 2020-08-02 MED ORDER — FLUDEOXYGLUCOSE F - 18 (FDG) INJECTION
5.7400 | Freq: Once | INTRAVENOUS | Status: AC | PRN
  Administered 2020-08-02: 5.74 via INTRAVENOUS

## 2020-08-07 ENCOUNTER — Other Ambulatory Visit: Payer: Medicare Other

## 2020-08-07 ENCOUNTER — Ambulatory Visit: Payer: Medicare Other

## 2020-08-07 ENCOUNTER — Ambulatory Visit: Payer: Medicare Other | Admitting: Oncology

## 2020-08-09 ENCOUNTER — Inpatient Hospital Stay (HOSPITAL_BASED_OUTPATIENT_CLINIC_OR_DEPARTMENT_OTHER): Payer: Medicare Other | Admitting: Oncology

## 2020-08-09 ENCOUNTER — Other Ambulatory Visit: Payer: Self-pay

## 2020-08-09 ENCOUNTER — Inpatient Hospital Stay: Payer: Medicare Other

## 2020-08-09 ENCOUNTER — Encounter: Payer: Self-pay | Admitting: Oncology

## 2020-08-09 ENCOUNTER — Inpatient Hospital Stay (HOSPITAL_BASED_OUTPATIENT_CLINIC_OR_DEPARTMENT_OTHER): Payer: Medicare Other | Admitting: Hospice and Palliative Medicine

## 2020-08-09 VITALS — BP 96/67 | HR 108 | Temp 96.7°F | Resp 16 | Wt 100.8 lb

## 2020-08-09 VITALS — BP 94/60 | HR 77 | Resp 16

## 2020-08-09 DIAGNOSIS — K219 Gastro-esophageal reflux disease without esophagitis: Secondary | ICD-10-CM | POA: Diagnosis not present

## 2020-08-09 DIAGNOSIS — Z515 Encounter for palliative care: Secondary | ICD-10-CM

## 2020-08-09 DIAGNOSIS — I9589 Other hypotension: Secondary | ICD-10-CM | POA: Diagnosis not present

## 2020-08-09 DIAGNOSIS — Z7189 Other specified counseling: Secondary | ICD-10-CM

## 2020-08-09 DIAGNOSIS — E871 Hypo-osmolality and hyponatremia: Secondary | ICD-10-CM | POA: Diagnosis not present

## 2020-08-09 DIAGNOSIS — G893 Neoplasm related pain (acute) (chronic): Secondary | ICD-10-CM | POA: Diagnosis not present

## 2020-08-09 DIAGNOSIS — E861 Hypovolemia: Secondary | ICD-10-CM

## 2020-08-09 DIAGNOSIS — Z79899 Other long term (current) drug therapy: Secondary | ICD-10-CM | POA: Diagnosis not present

## 2020-08-09 DIAGNOSIS — Z23 Encounter for immunization: Secondary | ICD-10-CM | POA: Diagnosis not present

## 2020-08-09 DIAGNOSIS — C3480 Malignant neoplasm of overlapping sites of unspecified bronchus and lung: Secondary | ICD-10-CM

## 2020-08-09 DIAGNOSIS — E1136 Type 2 diabetes mellitus with diabetic cataract: Secondary | ICD-10-CM | POA: Diagnosis not present

## 2020-08-09 DIAGNOSIS — R634 Abnormal weight loss: Secondary | ICD-10-CM | POA: Diagnosis not present

## 2020-08-09 DIAGNOSIS — E46 Unspecified protein-calorie malnutrition: Secondary | ICD-10-CM

## 2020-08-09 DIAGNOSIS — Z66 Do not resuscitate: Secondary | ICD-10-CM | POA: Diagnosis not present

## 2020-08-09 DIAGNOSIS — J449 Chronic obstructive pulmonary disease, unspecified: Secondary | ICD-10-CM | POA: Diagnosis not present

## 2020-08-09 DIAGNOSIS — Z5111 Encounter for antineoplastic chemotherapy: Secondary | ICD-10-CM

## 2020-08-09 DIAGNOSIS — C3490 Malignant neoplasm of unspecified part of unspecified bronchus or lung: Secondary | ICD-10-CM

## 2020-08-09 DIAGNOSIS — E43 Unspecified severe protein-calorie malnutrition: Secondary | ICD-10-CM | POA: Diagnosis not present

## 2020-08-09 DIAGNOSIS — Z681 Body mass index (BMI) 19 or less, adult: Secondary | ICD-10-CM | POA: Diagnosis not present

## 2020-08-09 DIAGNOSIS — R197 Diarrhea, unspecified: Secondary | ICD-10-CM | POA: Diagnosis not present

## 2020-08-09 DIAGNOSIS — E785 Hyperlipidemia, unspecified: Secondary | ICD-10-CM | POA: Diagnosis not present

## 2020-08-09 LAB — COMPREHENSIVE METABOLIC PANEL
ALT: 37 U/L (ref 0–44)
AST: 32 U/L (ref 15–41)
Albumin: 4 g/dL (ref 3.5–5.0)
Alkaline Phosphatase: 85 U/L (ref 38–126)
Anion gap: 10 (ref 5–15)
BUN: 17 mg/dL (ref 8–23)
CO2: 24 mmol/L (ref 22–32)
Calcium: 9.1 mg/dL (ref 8.9–10.3)
Chloride: 101 mmol/L (ref 98–111)
Creatinine, Ser: 0.63 mg/dL (ref 0.61–1.24)
GFR, Estimated: >60 mL/min (ref >60)
Glucose, Bld: 318 mg/dL — ABNORMAL HIGH (ref 70–99)
Potassium: 3.9 mmol/L (ref 3.5–5.1)
Sodium: 135 mmol/L (ref 135–145)
Total Bilirubin: 0.7 mg/dL (ref 0.3–1.2)
Total Protein: 7.1 g/dL (ref 6.5–8.1)

## 2020-08-09 LAB — CBC WITH DIFFERENTIAL/PLATELET
Abs Immature Granulocytes: 0.02 10*3/uL (ref 0.00–0.07)
Basophils Absolute: 0 10*3/uL (ref 0.0–0.1)
Basophils Relative: 1 %
Eosinophils Absolute: 0 10*3/uL (ref 0.0–0.5)
Eosinophils Relative: 0 %
HCT: 42.5 % (ref 39.0–52.0)
Hemoglobin: 15.3 g/dL (ref 13.0–17.0)
Immature Granulocytes: 0 %
Lymphocytes Relative: 17 %
Lymphs Abs: 0.9 10*3/uL (ref 0.7–4.0)
MCH: 34.2 pg — ABNORMAL HIGH (ref 26.0–34.0)
MCHC: 36 g/dL (ref 30.0–36.0)
MCV: 95.1 fL (ref 80.0–100.0)
Monocytes Absolute: 0.4 10*3/uL (ref 0.1–1.0)
Monocytes Relative: 8 %
Neutro Abs: 4.1 10*3/uL (ref 1.7–7.7)
Neutrophils Relative %: 74 %
Platelets: 215 10*3/uL (ref 150–400)
RBC: 4.47 MIL/uL (ref 4.22–5.81)
RDW: 12.8 % (ref 11.5–15.5)
WBC: 5.5 10*3/uL (ref 4.0–10.5)
nRBC: 0 % (ref 0.0–0.2)

## 2020-08-09 MED ORDER — SODIUM CHLORIDE 0.9 % IV SOLN
Freq: Once | INTRAVENOUS | Status: AC
  Filled 2020-08-09: qty 250

## 2020-08-09 NOTE — Progress Notes (Signed)
Patient reports he had episodes of diarrhea last week that was improved with OTC antidiarrheal medication.  His throat started a burning sensation with swallowing liquids this morning.

## 2020-08-09 NOTE — Progress Notes (Signed)
IN [facility] for IV hydration. Ate peanut butter nabs and drank cola. No difficulty swallowing noted. No complaints of pain. Nutritionist saw pt in exam room as well. Discharge from clinic. No complaints. Accompanied by daughter. Pt transported by wheelchair.

## 2020-08-09 NOTE — Progress Notes (Signed)
Nutrition Assessment   Reason for Assessment:   Weight loss, poor appetite   ASSESSMENT:  74 year old male with stage IV non small cell lung cancer who initially refused chemotherapy/immunotherapy but received palliative radiation to right upper lobe. Patient with recent progression and started on afatinib. Past medical history of COPD, DM, GERD.  Palliative care following.  Met with patient and daughter in [facility].  Patient reports decreased appetite.  Lives at home alone.  Uses microwave, griddle and toaster oven to prepare meals.  Likes eggs, potatoes, gravy biscuit or waffle/pancake, sometimes oatmeal for breakfast (around 7:30am).  Eats lunch around 2:30pm and that maybe a sandwich with diet Mt Dew (chicken salad, bologna, pimento cheese, peanut butter and jelly).  Usually goes to bed around 7;7:30pm.  Sometimes will drink a boost orignal before going to bed.  Also has one in the AM.  Does not have any upper teeth so all foods must be soft.  Eats pudding cups, sometimes ice cream.    Patient reports that he sometimes forgets to take humalog insulin.  Also had episodes where he did not eat and had low blood glucose. Does not want this to happen when he is home alone.   Medications: humalog, lantus, megace, prilosec   Labs: glucose 318   Anthropometrics:   Weight 100 lb 12.8 oz today  96 lb 9.6 oz on 9/30 02/03/20 117 lb 01/25/20 117 lb  15% weight loss in the last 6 months, significant  Estimated Energy Needs  Kcals: 1350-1575 Protein: 68-78 g Fluid: 1.3 L   NUTRITION DIAGNOSIS: Unintentional weight loss related to cancer progression, decreased appetite as evidenced by 15% weight loss in the last 6 months and poor appetite   INTERVENTION: Discussed strategies to increase calories and protein. Handout given to patient and daughter.   Concerned about elevated blood glucose and encouraged close follow-up with Dr Gabriel Carina (appt soon).  Would not recommend strict diet restrictions due  to weight loss, decreased intake, BMI 15.   Continue supplement shakes for added nutrition   MONITORING, EVALUATION, GOAL: weight trends, intake   Next Visit: October 28, phone call with daughter  Ernesto Rutherford. Zenia Resides, [place], [place] Registered Dietitian [phone] (mobile)

## 2020-08-09 NOTE — Progress Notes (Signed)
[facility]  [phone] [phone]   Name: Colin Novak Date: 08/09/2020 MRN: [id number]  DOB: 18-Aug-1946  Patient Care Team: Guadalupe Maple, MD as PCP - General (Family Medicine) Gabriel Carina, Betsey Holiday, MD as Physician Assistant (Endocrinology) Kem Parkinson, MD (Ophthalmology) Telford Nab, RN as Registered Nurse Tamala Julian, Jonette Eva, NP as Nurse Practitioner Noreene Filbert, MD as Radiation Oncologist (Radiation Oncology)    REASON FOR CONSULTATION: Palliative Care consult requested for this 74 y.o. male with multiple medical problems including stage IV non-small cell lung cancer who initially refused chemotherapy/immunotherapy but received palliative XRT to the right upper lobe as mass is obstructing his bronchus.    PET scan on 08/02/2020 revealed disease progression in the lungs.  Patient was started on afatinib.  Patient was referred to palliative care to help address goals and manage ongoing symptoms.  SOCIAL HISTORY:     reports that he has been smoking cigarettes. He has been smoking about 1.00 pack per day. He quit smokeless tobacco use about 61 years ago.  His smokeless tobacco use included chew. He reports that he does not drink alcohol and does not use drugs.   Patient is a widower.  He lives at home alone with his dog.  He has a daughter who is involved in his care.  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  ADVANCE DIRECTIVES:  Not on file  CODE STATUS: DNR (DNR form signed on 09/01/2019 and again on 08/09/2020)  PAST MEDICAL HISTORY: Past Medical History:  Diagnosis Date  . Anxiety   . COPD (chronic obstructive pulmonary disease) ([place])   . Diabetes mellitus without complication ([place])    type 2  . GERD (gastroesophageal reflux disease)   . Hyperlipidemia     PAST SURGICAL HISTORY:  Past Surgical History:  Procedure Laterality Date  . APPENDECTOMY    . ENDOBRONCHIAL  ULTRASOUND Bilateral 06/13/2019   Procedure: ENDOBRONCHIAL ULTRASOUND, BILATERAL;  Surgeon: [name], MD;  Location: [facility] ORS;  Service: Pulmonary;  Laterality: Bilateral;  . EYE SURGERY Bilateral    cataract    HEMATOLOGY/ONCOLOGY HISTORY:  Oncology History  Malignant neoplasm of lung ([place])  06/20/2019 Initial Diagnosis   Non-small cell lung cancer ([place])   01/16/2020 - 01/16/2020 Chemotherapy   The patient had dexamethasone (DECADRON) 4 MG tablet, 0 of 1 cycle, Start date: --, End date: -- palonosetron (ALOXI) injection 0.25 mg, 0.25 mg, Intravenous,  Once, 0 of 4 cycles PEMEtrexed (ALIMTA) 800 mg in sodium chloride 0.9 % 100 mL chemo infusion, 500 mg/m2, Intravenous,  Once, 0 of 4 cycles CARBOplatin (PARAPLATIN) in sodium chloride 0.9 % 100 mL chemo infusion, , Intravenous,  Once, 0 of 4 cycles fosaprepitant (EMEND) 150 mg in sodium chloride 0.9 % 145 mL IVPB, 150 mg, Intravenous,  Once, 0 of 4 cycles pembrolizumab (KEYTRUDA) 200 mg in sodium chloride 0.9 % 50 mL chemo infusion, 200 mg (original dose ), Intravenous, Once, 0 of 4 cycles Dose modification: 200 mg (Cycle 1, Reason: Other (see comments))  for chemotherapy treatment.    01/23/2020 - 01/23/2020 Chemotherapy   The patient had dexamethasone (DECADRON) 4 MG tablet, 1 of 1 cycle, Start date: 06/20/2019, End date: 01/16/2020 palonosetron (ALOXI) injection 0.25 mg, 0.25 mg, Intravenous,  Once, 0 of 4 cycles PEMEtrexed (ALIMTA) 775 mg in sodium chloride 0.9 % 100 mL chemo infusion, 500 mg/m2, Intravenous,  Once, 0 of 4 cycles CARBOplatin (PARAPLATIN) in sodium chloride 0.9 %  100 mL chemo infusion, , Intravenous,  Once, 0 of 4 cycles fosaprepitant (EMEND) 150 mg, dexamethasone (DECADRON) 12 mg in sodium chloride 0.9 % 145 mL IVPB, , Intravenous,  Once, 0 of 4 cycles  for chemotherapy treatment.    07/26/2020 -  Chemotherapy   The patient had [No matching medication found in this treatment plan]  for chemotherapy treatment.       ALLERGIES:  is allergic to penicillins.  MEDICATIONS:  Current Outpatient Medications  Medication Sig Dispense Refill  . afatinib dimaleate (GILOTRIF) 30 MG tablet Take 1 tablet (30 mg total) by mouth daily. Take on an empty stomach 1hr before or 2hrs after meals. 30 tablet 0  . albuterol (PROAIR HFA) 108 (90 Base) MCG/ACT inhaler Inhale 1-2 puffs into the lungs every 6 (six) hours as needed for wheezing or shortness of breath (use as needed for chest congestion.). 1 g 3  . aspirin EC 81 MG tablet Take 81 mg by mouth daily.    . citalopram (CELEXA) 20 MG tablet Take 1 tablet (20 mg total) by mouth daily. 90 tablet 4  . Dextromethorphan-guaiFENesin (ROBITUSSIN DM PO) Take 5 mLs by mouth at bedtime.     Marland Kitchen ezetimibe-simvastatin (VYTORIN) 10-40 MG tablet Take 1 tablet by mouth daily. 90 tablet 4  . Fluticasone-Umeclidin-Vilant (TRELEGY ELLIPTA) 100-62.5-25 MCG/INH AEPB Inhale 1 applicator into the lungs daily. Rinse mouth after use. 1 each 10  . glucose blood (ONE TOUCH ULTRA TEST) test strip 1 each by Other route 2 (two) times daily as needed for other. Dx: E11.9 (Patient not taking: Reported on 08/09/2020) 100 each 12  . HUMALOG KWIKPEN 100 UNIT/ML KwikPen 16 Units. Sliding scale up to 12 units    . LANTUS SOLOSTAR 100 UNIT/ML Solostar Pen INJECT 10-20 UNITS INTO THE SKIN DAILY 5 pen 12  . megestrol (MEGACE) 400 MG/10ML suspension Take 10 mLs (400 mg total) by mouth daily. 240 mL 0  . metFORMIN (GLUCOPHAGE) 500 MG tablet TAKE ONE TABLET BY MOUTH TWICE DAILY (Patient not taking: Reported on 07/26/2020) 60 tablet 12  . Multiple Vitamin (MULTIVITAMIN) tablet Take 1 tablet by mouth daily.     Marland Kitchen NOVOFINE 32G X 6 MM MISC USE AS DIRECTED. 5 INJECTIONS A DAY 900 each 0  . omeprazole (PRILOSEC) 20 MG capsule Take 1 capsule (20 mg total) by mouth daily. (Patient not taking: Reported on 07/26/2020) 30 capsule 11  . oxymetazoline (AFRIN) 0.05 % nasal spray Place 2 sprays into both nostrils 2 (two) times  daily as needed.     . pioglitazone (ACTOS) 30 MG tablet Take 30 mg by mouth daily.  (Patient not taking: Reported on 07/26/2020)  1  . pseudoephedrine-acetaminophen (TYLENOL SINUS) 30-500 MG TABS tablet Take 2 tablets by mouth every 4 (four) hours as needed. Only using twice daily (Patient not taking: Reported on 07/31/2020)    . triamcinolone (NASACORT) 55 MCG/ACT AERO nasal inhaler Place 1 spray into the nose 2 (two) times daily. (Patient not taking: Reported on 02/03/2020) 1 Inhaler 2   No current facility-administered medications for this visit.    VITAL SIGNS: There were no vitals taken for this visit. There were no vitals filed for this visit.  Estimated body mass index is 16.27 kg/m as calculated from the following:   Height as of 02/03/20: 5\' 6"  (1.676 m).   Weight as of an earlier encounter on 08/09/20: 100 lb 12.8 oz (45.7 kg).  LABS: CBC:    Component Value Date/Time   WBC 5.5  08/09/2020 0854   HGB 15.3 08/09/2020 0854   HGB 13.2 12/06/2018 0857   HCT 42.5 08/09/2020 0854   HCT 38.6 12/06/2018 0857   PLT 215 08/09/2020 0854   PLT 266 12/06/2018 0857   MCV 95.1 08/09/2020 0854   MCV 96 12/06/2018 0857   NEUTROABS 4.1 08/09/2020 0854   NEUTROABS 1.7 12/06/2018 0857   LYMPHSABS 0.9 08/09/2020 0854   LYMPHSABS 1.6 12/06/2018 0857   MONOABS 0.4 08/09/2020 0854   EOSABS 0.0 08/09/2020 0854   EOSABS 0.1 12/06/2018 0857   BASOSABS 0.0 08/09/2020 0854   BASOSABS 0.1 12/06/2018 0857   Comprehensive Metabolic Panel:    Component Value Date/Time   NA 135 08/09/2020 0854   NA 140 12/06/2018 0857   K 3.9 08/09/2020 0854   CL 101 08/09/2020 0854   CO2 24 08/09/2020 0854   BUN 17 08/09/2020 0854   BUN 9 12/06/2018 0857   CREATININE 0.63 08/09/2020 0854   GLUCOSE 318 (H) 08/09/2020 0854   CALCIUM 9.1 08/09/2020 0854   AST 32 08/09/2020 0854   AST 55 (H) 04/01/2017 0907   ALT 37 08/09/2020 0854   ALT 48 (H) 04/01/2017 0907   ALKPHOS 85 08/09/2020 0854   BILITOT 0.7  08/09/2020 0854   BILITOT <0.2 12/06/2018 0857   PROT 7.1 08/09/2020 0854   PROT 6.4 12/06/2018 0857   ALBUMIN 4.0 08/09/2020 0854   ALBUMIN 4.1 12/06/2018 0857    RADIOGRAPHIC STUDIES: CT Chest W Contrast  Result Date: 07/25/2020 CLINICAL DATA:  Non-small cell lung cancer, follow-up EXAM: CT CHEST WITH CONTRAST TECHNIQUE: Multidetector CT imaging of the chest was performed during intravenous contrast administration. CONTRAST:  69mL OMNIPAQUE IOHEXOL 300 MG/ML  SOLN COMPARISON:  PET-CT dated 01/23/2020.  CT chest dated 01/06/2020. FINDINGS: Cardiovascular: Heart is normal in size.  No pericardial effusion. Ectasia of the ascending thoracic aorta, measuring 4.0 cm. Atherosclerotic calcifications of the aortic arch. Progressive layering eccentric mural thrombus along the descending thoracic aorta (series 2/image 116). Coronary atherosclerosis of the LAD and right coronary artery. Mediastinum/Nodes: No suspicious mediastinal lymphadenopathy. Visualized thyroid is unremarkable. Lungs/Pleura: 4.8 x 2.6 cm masslike opacity in the medial right upper lobe with volume loss (series 3/image 52), previously 5.1 x 2.7 cm, grossly unchanged. Masslike opacity with architectural distortion in the posterior left upper lobe measures 3.6 x 3.7 cm (series 3/image 39), previously 3.6 x 3.2 cm when remeasured in a similar fashion. Adjacent satellite nodularity. 15 x 10 mm nodular opacity in the superior segment left lower lobe along the fissure (series 3/image 61), previously 11 x 8 mm when remeasured in a similar fashion. Adjacent satellite nodularity. 2.6 x 1.7 cm spiculated nodule in the central left lower lobe (series 3/image 83), previously 1.8 x 1.3 cm when remeasured in a similar fashion. 14 mm irregular nodule in the lateral left lower lobe (series 3/image 113), previously 10 mm. Additional scattered small bilateral pulmonary nodules, mildly progressive, particularly in the right lower lobe (series 3/images 122 and  123). Underlying severe centrilobular and paraseptal emphysematous changes, upper lung predominant. No focal consolidation. No pleural effusion or pneumothorax. Upper Abdomen: Visualized upper abdomen is grossly unremarkable. Musculoskeletal: Visualized osseous structures are within normal limits. IMPRESSION: 4.8 cm masslike opacity in the medial right upper lobe is grossly unchanged. Additional bilateral pulmonary nodules/masses have progressed, including the dominant 3.7 cm mass in the left upper lobe. Ectasia of the ascending thoracic aorta, measuring 4.0 cm. Attention on follow-up is suggested. Aortic Atherosclerosis (ICD10-I70.0) and Emphysema (ICD10-J43.9). Electronically  Signed   By: Julian Hy M.D.   On: 07/25/2020 07:37   NM PET Image Restag (PS) Skull Base To Thigh  Result Date: 08/02/2020 CLINICAL DATA:  Subsequent treatment strategy for non-small cell lung cancer. Ongoing chemotherapy. EXAM: NUCLEAR MEDICINE PET SKULL BASE TO THIGH TECHNIQUE: 5.7 mCi F-18 FDG was injected intravenously. Full-ring PET imaging was performed from the skull base to thigh after the radiotracer. CT data was obtained and used for attenuation correction and anatomic localization. Fasting blood glucose: 242 mg/dl COMPARISON:  07/24/2020 chest CT.  01/23/2020 PET-CT. FINDINGS: Mediastinal blood pool activity: SUV max 1.9 Liver activity: SUV max NA NECK: No hypermetabolic lymph nodes in the neck. Incidental CT findings: none CHEST: No recurrent hypermetabolism at the site of masslike fibrosis in paramediastinal right upper lobe measuring 5.2 x 2.8 cm (series 3/image 83). Mild hypermetabolism associated with spiculated irregular posterior left upper lobe 3.7 cm lung mass with max SUV 3.0 (series 3/image 74), previously 3.6 cm with max SUV 3.7 on 01/23/2020 PET-CT using similar measurement technique, not appreciably changed in size or metabolism. Numerous (greater than 10) irregular pulmonary nodules scattered throughout  the left greater than right lungs, all increased or new since 01/23/2020 PET-CT study, none of which demonstrate significant FDG uptake, noting that several are subcentimeter and below PET resolution. Representative spiculated 2.2 cm superior segment left lower lobe nodule (series 3/image 98), increased from 1.8 cm on 01/23/2020 PET-CT. Representative 0.9 cm posterior right lower lobe nodule (series 3/image 129), new. No enlarged or hypermetabolic axillary, mediastinal or hilar lymph nodes. Incidental CT findings: Coronary atherosclerosis. Atherosclerotic thoracic aorta with ectatic 4.2 cm ascending thoracic aorta, unchanged. Severe centrilobular emphysema with diffuse bronchial wall thickening. ABDOMEN/PELVIS: No abnormal hypermetabolic activity within the liver, pancreas, adrenal glands, or spleen. No hypermetabolic lymph nodes in the abdomen or pelvis. Incidental CT findings: Stable low-attenuation 3.9 cm exophytic lesion in the lower left kidney, unchanged and without appreciable FDG uptake. Atherosclerotic nonaneurysmal abdominal aorta. Mild prostatomegaly. SKELETON: No focal hypermetabolic activity to suggest skeletal metastasis. Incidental CT findings: none IMPRESSION: 1. Mildly hypermetabolic left upper lobe 3.7 cm lung mass, not appreciably changed in size or metabolism since 01/23/2020 PET-CT. 2. Numerous (greater than 10) additional irregular pulmonary nodules scattered throughout the left greater than right lungs, all increased or new since 01/23/2020 PET-CT study, none demonstrating significant FDG uptake. Findings are most compatible with progression of metastatic disease. 3. No recurrent hypermetabolism at the site of masslike fibrosis in the right upper lobe. No recurrent hypermetabolic thoracic adenopathy. 4. No hypermetabolic metastatic disease in the abdomen, pelvis or skeleton. 5. Chronic findings include: Aortic Atherosclerosis (ICD10-I70.0) and Emphysema (ICD10-J43.9). Coronary atherosclerosis.  Ectatic 4.2 cm ascending thoracic aorta. Mild prostatomegaly. Electronically Signed   By: Ilona Sorrel M.D.   On: 08/02/2020 15:17    PERFORMANCE STATUS (ECOG) : 1 - Symptomatic but completely ambulatory  Review of Systems Unless otherwise noted, a complete review of systems is negative.  Physical Exam General: NAD, frail appearing, thin Pulmonary: Unlabored Extremities: no edema Skin: no rashes Neurological: Weakness but otherwise nonfocal  IMPRESSION: Routine follow-up visit.  Patient is started on afatinib and is receiving IV fluids today.  He is accompanied by his daughter.  Patient reports being overall weak but still remains at home alone.  Daughter is worried that he is in need of more care at home.  Patient's goals are still aligned with ongoing treatment so hospice would not yet be a consideration.  Will instead order home health  PT/OT and palliative care.  We discussed DME needs.  He is utilizing a walker in the home to ambulate.  He is struggling some with taking a shower.  I suggested a shower chair but he declined that.  We discussed CODE STATUS and I reviewed with him a MOST form, which he took home.  Patient says that he still is a DNR/DNI and has his DNR form on the refrigerator.   PLAN: -Continue current scope of treatment -Home health referral -Referral for home palliative care -MOST Form reviewed -DNR/DNI -Follow-up MyChart visit about a month  Case and plan discussed with Dr. Tasia Catchings   Patient expressed understanding and was in agreement with this plan. He also understands that He can call the clinic at any time with any questions, concerns, or complaints.     Time Total: 25 minutes  Visit consisted of counseling and education dealing with the complex and emotionally intense issues of symptom management and palliative care in the setting of serious and potentially life-threatening illness.Greater than 50%  of this time was spent counseling and coordinating  care related to the above assessment and plan.  Signed by: Altha Harm, PhD, NP-C

## 2020-08-09 NOTE — Progress Notes (Signed)
Hematology/Oncology follow up note [facility] Telephone:([phone]) [phone] [phone]   Patient Care Team: Guadalupe Maple, MD as PCP - General (Family Medicine) Gabriel Carina, Betsey Holiday, MD as Physician Assistant (Endocrinology) Kem Parkinson, MD (Ophthalmology) Telford Nab, RN as Registered Nurse Tamala Julian, Jonette Eva, NP as Nurse Practitioner Noreene Filbert, MD as Radiation Oncologist (Radiation Oncology)  REFERRING PROVIDER: Guadalupe Maple, MD  CHIEF COMPLAINTS/REASON FOR VISIT:  Follow up for non-small cell lung cancer  HISTORY OF PRESENTING ILLNESS:  Colin Novak is a  74 y.o.  male with PMH listed below was seen in consultation at the request of  Guadalupe Maple, MD  for evaluation of lung mass Patient reports ongoing shortness of breath, cough for  few months. He had CT chest scan done on 06/01/2019 which showed a concerning feature of 9.1 x 5.1 x 3.6 cm right upper lobe mass extending into mediastinum, encasing and occluding right upper lobe bronchus and some of the right upper lobe bronchial branches.  There is also significant narrowing of FVC. He also had a similar appearance of left upper lobe mass without mediastinal extension suspicious for a synchronized primary lung cancer Multiple additional smaller bilateral lung nodules, left greater than right. Mildly enlarged right hilar and mediastinal lymph nodes suspicious for metastatic lymphadenopathy. 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 Daughter [name] is his power of attorney. Denies fever or chills.  # patient was seen by me on 06/20/2019.  At that time, patient has 2 separate lung tumor.  Possibility of stage IV lung adenocarcinoma TX N1 M1 discussed with patient given that he has tumor in the contralateral lobe.  Systemic chemotherapy with immunotherapy was recommended.  Patient declined at  that time. He may have 2 separate primary lung cancer, if that is the case, he has stage III lung cancer.  would benefit from definitive concurrent chemoradiation followed by immunotherapy for maintenance. Patient has decided at that time that he does not want any systemic chemotherapy.   08/05/2019 He finished radiation to right upper lobe lesion.  09/16/2019 s/p radiation boost  01/06/2020 CT scan showed interval increase of the right para mediastinal and hilar mass and also multiple areas of increased density in the left lower lobe consistent with multifocal adenocarcinoma/metastasis.  #01/23/2020, PET scan showed a decreased left upper lobe and right upper lobe lesion. There is still occlusion of the right upper lobe bronchus with some associated atelectasis.Enlarged left lung nodules have not had increased metabolic activity and remained well below blood pool.New sclerosis in the upper sternal body comparing to previous PET scan. -Patient was not interested in any systemic treatment.  Declined immunotherapy maintenance.  NGS was sent.  he follows up with palliative care service.  INTERVAL HISTORY Colin Novak is a 74 y.o. male who has above history reviewed by me today presents for follow up visit for management of  non-small cell lung cancer. Problems and complaints are listed below: He has been started on Afatinib since 07/31/2020. So far he tolerates well.  Mild diarrhea episodes which was improved with over the counter imodium he has.  Has gained weight, appetite is fair He drinks nutrition supplements.  PET was done and MRI brain are scheduled.     Review of Systems  Constitutional: Positive for fatigue. Negative for appetite change, chills, diaphoresis, fever and unexpected weight change.  HENT:   Negative for hearing loss, lump/mass, nosebleeds,  sore throat and voice change.   Eyes: Negative for eye problems and icterus.  Respiratory: Positive for cough. Negative for chest  tightness, hemoptysis, shortness of breath and wheezing.   Cardiovascular: Negative for chest pain and leg swelling.  Gastrointestinal: Negative for abdominal distention, abdominal pain, blood in stool, diarrhea, nausea and rectal pain.  Endocrine: Negative for hot flashes.  Genitourinary: Negative for bladder incontinence, difficulty urinating, dysuria, frequency, hematuria and nocturia.   Musculoskeletal: Positive for back pain. Negative for arthralgias, flank pain, gait problem and myalgias.  Skin: Negative for itching and rash.  Neurological: Negative for dizziness, gait problem, headaches, light-headedness, numbness and seizures.  Hematological: Negative for adenopathy. Does not bruise/bleed easily.  Psychiatric/Behavioral: Negative for confusion and decreased concentration. The patient is not nervous/anxious.     MEDICAL HISTORY:  Past Medical History:  Diagnosis Date  . Anxiety   . COPD (chronic obstructive pulmonary disease) (HCC)   . Diabetes mellitus without complication (HCC)    type 2  . GERD (gastroesophageal reflux disease)   . Hyperlipidemia     SURGICAL HISTORY: Past Surgical History:  Procedure Laterality Date  . APPENDECTOMY    . ENDOBRONCHIAL ULTRASOUND Bilateral 06/13/2019   Procedure: ENDOBRONCHIAL ULTRASOUND, BILATERAL;  Surgeon: Ramachandran, Pradeep, MD;  Location: [facility] ORS;  Service: Pulmonary;  Laterality: Bilateral;  . EYE SURGERY Bilateral    cataract    SOCIAL HISTORY: Social History   Socioeconomic History  . Marital status: Widowed    Spouse name: Not on file  . Number of children: 1  . Years of education: Not on file  . Highest education level: High school graduate  Occupational History  . Occupation: retired  Tobacco Use  . Smoking status: Current Every Day Smoker    Packs/day: 1.00    Types: Cigarettes  . Smokeless tobacco: Former User    Types: Chew    Quit date: 06/10/1959  . Tobacco comment: 1 pack daily- 06/09/2019  Vaping Use  .  Vaping Use: Never used  Substance and Sexual Activity  . Alcohol use: No  . Drug use: No  . Sexual activity: Not Currently  Other Topics Concern  . Not on file  Social History Narrative  . Not on file   Social Determinants of Health   Financial Resource Strain:   . Difficulty of Paying Living Expenses: Not on file  Food Insecurity:   . Worried About Running Out of Food in the Last Year: Not on file  . Ran Out of Food in the Last Year: Not on file  Transportation Needs:   . Lack of Transportation (Medical): Not on file  . Lack of Transportation (Non-Medical): Not on file  Physical Activity:   . Days of Exercise per Week: Not on file  . Minutes of Exercise per Session: Not on file  Stress:   . Feeling of Stress : Not on file  Social Connections:   . Frequency of Communication with Friends and Family: Not on file  . Frequency of Social Gatherings with Friends and Family: Not on file  . Attends Religious Services: Not on file  . Active Member of Clubs or Organizations: Not on file  . Attends Club or Organization Meetings: Not on file  . Marital Status: Not on file  Intimate Partner Violence:   . Fear of Current or Ex-Partner: Not on file  . Emotionally Abused: Not on file  . Physically Abused: Not on file  . Sexually Abused: Not on file    FAMILY HISTORY:   Family History  Problem Relation Age of Onset  . Cancer Mother        lung (non smoker)  . Diabetes Father   . Hypertension Father   . Lymphoma Brother   . Prostate cancer Brother     ALLERGIES:  is allergic to penicillins.  MEDICATIONS:  Current Outpatient Medications  Medication Sig Dispense Refill  . afatinib dimaleate (GILOTRIF) 30 MG tablet Take 1 tablet (30 mg total) by mouth daily. Take on an empty stomach 1hr before or 2hrs after meals. 30 tablet 0  . albuterol (PROAIR HFA) 108 (90 Base) MCG/ACT inhaler Inhale 1-2 puffs into the lungs every 6 (six) hours as needed for wheezing or shortness of breath (use  as needed for chest congestion.). 1 g 3  . aspirin EC 81 MG tablet Take 81 mg by mouth daily.    . citalopram (CELEXA) 20 MG tablet Take 1 tablet (20 mg total) by mouth daily. 90 tablet 4  . Dextromethorphan-guaiFENesin (ROBITUSSIN DM PO) Take 5 mLs by mouth at bedtime.     Marland Kitchen ezetimibe-simvastatin (VYTORIN) 10-40 MG tablet Take 1 tablet by mouth daily. 90 tablet 4  . Fluticasone-Umeclidin-Vilant (TRELEGY ELLIPTA) 100-62.5-25 MCG/INH AEPB Inhale 1 applicator into the lungs daily. Rinse mouth after use. 1 each 10  . HUMALOG KWIKPEN 100 UNIT/ML KwikPen 16 Units. Sliding scale up to 12 units    . LANTUS SOLOSTAR 100 UNIT/ML Solostar Pen INJECT 10-20 UNITS INTO THE SKIN DAILY 5 pen 12  . megestrol (MEGACE) 400 MG/10ML suspension Take 10 mLs (400 mg total) by mouth daily. 240 mL 0  . Multiple Vitamin (MULTIVITAMIN) tablet Take 1 tablet by mouth daily.     Marland Kitchen NOVOFINE 32G X 6 MM MISC USE AS DIRECTED. 5 INJECTIONS A DAY 900 each 0  . oxymetazoline (AFRIN) 0.05 % nasal spray Place 2 sprays into both nostrils 2 (two) times daily as needed.     Marland Kitchen glucose blood (ONE TOUCH ULTRA TEST) test strip 1 each by Other route 2 (two) times daily as needed for other. Dx: E11.9 (Patient not taking: Reported on 08/09/2020) 100 each 12  . metFORMIN (GLUCOPHAGE) 500 MG tablet TAKE ONE TABLET BY MOUTH TWICE DAILY (Patient not taking: Reported on 07/26/2020) 60 tablet 12  . omeprazole (PRILOSEC) 20 MG capsule Take 1 capsule (20 mg total) by mouth daily. (Patient not taking: Reported on 07/26/2020) 30 capsule 11  . pioglitazone (ACTOS) 30 MG tablet Take 30 mg by mouth daily.  (Patient not taking: Reported on 07/26/2020)  1  . pseudoephedrine-acetaminophen (TYLENOL SINUS) 30-500 MG TABS tablet Take 2 tablets by mouth every 4 (four) hours as needed. Only using twice daily (Patient not taking: Reported on 07/31/2020)    . triamcinolone (NASACORT) 55 MCG/ACT AERO nasal inhaler Place 1 spray into the nose 2 (two) times daily. (Patient not  taking: Reported on 02/03/2020) 1 Inhaler 2   No current facility-administered medications for this visit.     PHYSICAL EXAMINATION: ECOG PERFORMANCE STATUS: 2 - Symptomatic, <50% confined to bed Vitals:   08/09/20 0913  BP: 96/67  Pulse: (!) 108  Resp: 16  Temp: (!) 96.7 F (35.9 C)   Filed Weights   08/09/20 0913  Weight: 100 lb 12.8 oz (45.7 kg)    Physical Exam Constitutional:      General: He is not in acute distress.    Appearance: He is ill-appearing.     Comments: Cachectic  HENT:     Head: Normocephalic and atraumatic.  Eyes:     General: No scleral icterus.    Pupils: Pupils are equal, round, and reactive to light.  Neck:     Comments: No neck swelling Cardiovascular:     Rate and Rhythm: Normal rate and regular rhythm.     Heart sounds: Normal heart sounds.  Pulmonary:     Effort: Pulmonary effort is normal. No respiratory distress.     Breath sounds: No wheezing.     Comments: Decreased breath sounds bilaterally.  Abdominal:     General: Bowel sounds are normal. There is no distension.     Palpations: Abdomen is soft. There is no mass.  Musculoskeletal:        General: No deformity. Normal range of motion.     Cervical back: Normal range of motion and neck supple.     Comments: Clubbing  Skin:    General: Skin is warm and dry.     Findings: No erythema or rash.  Neurological:     General: No focal deficit present.     Mental Status: He is alert and oriented to person, place, and time. Mental status is at baseline.     Cranial Nerves: No cranial nerve deficit.     Coordination: Coordination normal.  Psychiatric:        Mood and Affect: Mood normal.     LABORATORY DATA:  I have reviewed the data as listed Lab Results  Component Value Date   WBC 5.5 08/09/2020   HGB 15.3 08/09/2020   HCT 42.5 08/09/2020   MCV 95.1 08/09/2020   PLT 215 08/09/2020   Recent Labs    09/02/19 0932 01/06/20 1417 01/16/20 0942 07/24/20 1104 07/26/20 1001  07/31/20 1059 08/09/20 0854  NA 135  --  132*  --  130* 139 135  K 4.8  --  4.9  --  4.4 4.3 3.9  CL 101  --  98  --  92* 102 101  CO2 26  --  24  --  26 28 24  GLUCOSE 149*  --  460*  --  395* 132* 318*  BUN 14  --  13  --  15 11 17  CREATININE 0.65   < > 0.80   < > 0.77 0.55* 0.63  CALCIUM 9.1  --  9.3  --  9.0 8.9 9.1  GFRNONAA >60  --  >60  --  >60 >60 >60  GFRAA >60  --  >60  --  >60  --   --   PROT 7.3  --  7.7  --  7.1  --  7.1  ALBUMIN 4.1  --  4.4  --  4.0  --  4.0  AST 26  --  30  --  15  --  32  ALT 26  --  33  --  12  --  37  ALKPHOS 80  --  110  --  93  --  85  BILITOT 0.7  --  0.9  --  1.0  --  0.7   < > = values in this interval not displayed.   Iron/TIBC/Ferritin/ %Sat No results found for: IRON, TIBC, FERRITIN, IRONPCTSAT    RADIOGRAPHIC STUDIES: I have personally reviewed the radiological images as listed and agreed with the findings in the report.  CT Chest W Contrast  Result Date: 07/25/2020 CLINICAL DATA:  Non-small cell lung cancer, follow-up EXAM: CT CHEST WITH CONTRAST TECHNIQUE: Multidetector CT imaging of the chest was performed during intravenous   contrast administration. CONTRAST:  75mL OMNIPAQUE IOHEXOL 300 MG/ML  SOLN COMPARISON:  PET-CT dated 01/23/2020.  CT chest dated 01/06/2020. FINDINGS: Cardiovascular: Heart is normal in size.  No pericardial effusion. Ectasia of the ascending thoracic aorta, measuring 4.0 cm. Atherosclerotic calcifications of the aortic arch. Progressive layering eccentric mural thrombus along the descending thoracic aorta (series 2/image 116). Coronary atherosclerosis of the LAD and right coronary artery. Mediastinum/Nodes: No suspicious mediastinal lymphadenopathy. Visualized thyroid is unremarkable. Lungs/Pleura: 4.8 x 2.6 cm masslike opacity in the medial right upper lobe with volume loss (series 3/image 52), previously 5.1 x 2.7 cm, grossly unchanged. Masslike opacity with architectural distortion in the posterior left upper lobe  measures 3.6 x 3.7 cm (series 3/image 39), previously 3.6 x 3.2 cm when remeasured in a similar fashion. Adjacent satellite nodularity. 15 x 10 mm nodular opacity in the superior segment left lower lobe along the fissure (series 3/image 61), previously 11 x 8 mm when remeasured in a similar fashion. Adjacent satellite nodularity. 2.6 x 1.7 cm spiculated nodule in the central left lower lobe (series 3/image 83), previously 1.8 x 1.3 cm when remeasured in a similar fashion. 14 mm irregular nodule in the lateral left lower lobe (series 3/image 113), previously 10 mm. Additional scattered small bilateral pulmonary nodules, mildly progressive, particularly in the right lower lobe (series 3/images 122 and 123). Underlying severe centrilobular and paraseptal emphysematous changes, upper lung predominant. No focal consolidation. No pleural effusion or pneumothorax. Upper Abdomen: Visualized upper abdomen is grossly unremarkable. Musculoskeletal: Visualized osseous structures are within normal limits. IMPRESSION: 4.8 cm masslike opacity in the medial right upper lobe is grossly unchanged. Additional bilateral pulmonary nodules/masses have progressed, including the dominant 3.7 cm mass in the left upper lobe. Ectasia of the ascending thoracic aorta, measuring 4.0 cm. Attention on follow-up is suggested. Aortic Atherosclerosis (ICD10-I70.0) and Emphysema (ICD10-J43.9). Electronically Signed   By: Sriyesh  Krishnan M.D.   On: 07/25/2020 07:37   NM PET Image Restag (PS) Skull Base To Thigh  Result Date: 08/02/2020 CLINICAL DATA:  Subsequent treatment strategy for non-small cell lung cancer. Ongoing chemotherapy. EXAM: NUCLEAR MEDICINE PET SKULL BASE TO THIGH TECHNIQUE: 5.7 mCi F-18 FDG was injected intravenously. Full-ring PET imaging was performed from the skull base to thigh after the radiotracer. CT data was obtained and used for attenuation correction and anatomic localization. Fasting blood glucose: 242 mg/dl  COMPARISON:  07/24/2020 chest CT.  01/23/2020 PET-CT. FINDINGS: Mediastinal blood pool activity: SUV max 1.9 Liver activity: SUV max NA NECK: No hypermetabolic lymph nodes in the neck. Incidental CT findings: none CHEST: No recurrent hypermetabolism at the site of masslike fibrosis in paramediastinal right upper lobe measuring 5.2 x 2.8 cm (series 3/image 83). Mild hypermetabolism associated with spiculated irregular posterior left upper lobe 3.7 cm lung mass with max SUV 3.0 (series 3/image 74), previously 3.6 cm with max SUV 3.7 on 01/23/2020 PET-CT using similar measurement technique, not appreciably changed in size or metabolism. Numerous (greater than 10) irregular pulmonary nodules scattered throughout the left greater than right lungs, all increased or new since 01/23/2020 PET-CT study, none of which demonstrate significant FDG uptake, noting that several are subcentimeter and below PET resolution. Representative spiculated 2.2 cm superior segment left lower lobe nodule (series 3/image 98), increased from 1.8 cm on 01/23/2020 PET-CT. Representative 0.9 cm posterior right lower lobe nodule (series 3/image 129), new. No enlarged or hypermetabolic axillary, mediastinal or hilar lymph nodes. Incidental CT findings: Coronary atherosclerosis. Atherosclerotic thoracic aorta with ectatic 4.2 cm ascending thoracic   aorta, unchanged. Severe centrilobular emphysema with diffuse bronchial wall thickening. ABDOMEN/PELVIS: No abnormal hypermetabolic activity within the liver, pancreas, adrenal glands, or spleen. No hypermetabolic lymph nodes in the abdomen or pelvis. Incidental CT findings: Stable low-attenuation 3.9 cm exophytic lesion in the lower left kidney, unchanged and without appreciable FDG uptake. Atherosclerotic nonaneurysmal abdominal aorta. Mild prostatomegaly. SKELETON: No focal hypermetabolic activity to suggest skeletal metastasis. Incidental CT findings: none IMPRESSION: 1. Mildly hypermetabolic left upper  lobe 3.7 cm lung mass, not appreciably changed in size or metabolism since 01/23/2020 PET-CT. 2. Numerous (greater than 10) additional irregular pulmonary nodules scattered throughout the left greater than right lungs, all increased or new since 01/23/2020 PET-CT study, none demonstrating significant FDG uptake. Findings are most compatible with progression of metastatic disease. 3. No recurrent hypermetabolism at the site of masslike fibrosis in the right upper lobe. No recurrent hypermetabolic thoracic adenopathy. 4. No hypermetabolic metastatic disease in the abdomen, pelvis or skeleton. 5. Chronic findings include: Aortic Atherosclerosis (ICD10-I70.0) and Emphysema (ICD10-J43.9). Coronary atherosclerosis. Ectatic 4.2 cm ascending thoracic aorta. Mild prostatomegaly. Electronically Signed   By: Ilona Sorrel M.D.   On: 08/02/2020 15:17      ASSESSMENT & PLAN:  1. Malignant neoplasm of overlapping sites of lung, unspecified laterality ([place])   2. Protein-calorie malnutrition, unspecified severity ([place])   3. Hypotension due to hypovolemia   4. NSCLC with EGFR mutation ([place])   5. Encounter for antineoplastic chemotherapy   6. 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  NGS EGFR G719S+, [id number], PD-L1 30% 08/02/2020 PET scan images were reviewed  No recurrent hypermetabolism at the site of masslike fibrosis in paramediastinal right upper lobe  Mildly hypermetabolic left upper lobe 8.1KG mass, not changed since last PET 6 months ago,  Numerous (>10) irregular pulmonary nodules scattered through out bilateral lungs,increased or new from last study. Non has significant FDG uptake, compatible with progression of metastatic disease. No recurrent hypermetabolism at the site of masslike fibrosis in the right upper lobe. No recurrent hypermetabolic thoracic Adenopathy. No hypermetabolic metastatic disease in the  abdomen, pelvis or Skeleton  Labs reviewed and discussed with patient. Patient tolerates afatinib 30 mg daily.  Continue current regimen. Discussed with patient about antidiarrhea medication instructions.  Diarrhea symptom is controlled. MRI brain is pending.  #Uncontrolled diabetes, last A1c was done on 12/28/2019 which was 12.3 patient is on Lantus, Humalog.  He is not taking Metformin, Actos.  Discussed with patient to make an appointment with primary care provider and Dr. Gabriel Carina for further adjustment of diabetic regimen. Severe protein calorie malnutrition.weight loss Refer to follow-up with nutritionist.   Continue nutrition supplementation Glucerna. Add Megace 446m daily. He is not taking medication.  He has gained weight.  #Hyponatremia, has improved. #Hypotension, poor oral intake, patient will receive 1 L of IV fluid today.  Encourage oral hydration.  Follow-up in 2 weeks All questions were answered. The patient knows to call the clinic with any problems questions or concerns.  ZEarlie Server MD, PhD Hematology Oncology CThe Unity Hospital Of [facility]St Marys Campusat AClay Surgery CenterPager- [phone]/14/2021

## 2020-08-10 ENCOUNTER — Telehealth: Payer: Self-pay

## 2020-08-10 NOTE — Telephone Encounter (Signed)
Referral for home health PT/OT faxed to [facility] @ [phone]. Fax confirmation received.

## 2020-08-10 NOTE — Telephone Encounter (Signed)
Received community message from [employer] with [facility] stating that they have received referral and will get him seen asap.

## 2020-08-12 DIAGNOSIS — C3411 Malignant neoplasm of upper lobe, right bronchus or lung: Secondary | ICD-10-CM | POA: Diagnosis not present

## 2020-08-12 DIAGNOSIS — F17210 Nicotine dependence, cigarettes, uncomplicated: Secondary | ICD-10-CM | POA: Diagnosis not present

## 2020-08-12 DIAGNOSIS — F419 Anxiety disorder, unspecified: Secondary | ICD-10-CM | POA: Diagnosis not present

## 2020-08-12 DIAGNOSIS — E119 Type 2 diabetes mellitus without complications: Secondary | ICD-10-CM | POA: Diagnosis not present

## 2020-08-12 DIAGNOSIS — Z9842 Cataract extraction status, left eye: Secondary | ICD-10-CM | POA: Diagnosis not present

## 2020-08-12 DIAGNOSIS — Z7982 Long term (current) use of aspirin: Secondary | ICD-10-CM | POA: Diagnosis not present

## 2020-08-12 DIAGNOSIS — E785 Hyperlipidemia, unspecified: Secondary | ICD-10-CM | POA: Diagnosis not present

## 2020-08-12 DIAGNOSIS — Z7951 Long term (current) use of inhaled steroids: Secondary | ICD-10-CM | POA: Diagnosis not present

## 2020-08-12 DIAGNOSIS — J449 Chronic obstructive pulmonary disease, unspecified: Secondary | ICD-10-CM | POA: Diagnosis not present

## 2020-08-12 DIAGNOSIS — Z79818 Long term (current) use of other agents affecting estrogen receptors and estrogen levels: Secondary | ICD-10-CM | POA: Diagnosis not present

## 2020-08-12 DIAGNOSIS — K219 Gastro-esophageal reflux disease without esophagitis: Secondary | ICD-10-CM | POA: Diagnosis not present

## 2020-08-12 DIAGNOSIS — Z9181 History of falling: Secondary | ICD-10-CM | POA: Diagnosis not present

## 2020-08-12 DIAGNOSIS — Z79899 Other long term (current) drug therapy: Secondary | ICD-10-CM | POA: Diagnosis not present

## 2020-08-12 DIAGNOSIS — Z923 Personal history of irradiation: Secondary | ICD-10-CM | POA: Diagnosis not present

## 2020-08-12 DIAGNOSIS — Z794 Long term (current) use of insulin: Secondary | ICD-10-CM | POA: Diagnosis not present

## 2020-08-12 DIAGNOSIS — Z9841 Cataract extraction status, right eye: Secondary | ICD-10-CM | POA: Diagnosis not present

## 2020-08-13 ENCOUNTER — Other Ambulatory Visit: Payer: Self-pay

## 2020-08-13 ENCOUNTER — Ambulatory Visit
Admission: RE | Admit: 2020-08-13 | Discharge: 2020-08-13 | Disposition: A | Discharge status: Home / Self Care | Payer: Medicare Other | Source: Ambulatory Visit | Attending: Oncology | Admitting: Oncology

## 2020-08-13 ENCOUNTER — Telehealth: Payer: Self-pay | Admitting: Nurse Practitioner

## 2020-08-13 DIAGNOSIS — C3490 Malignant neoplasm of unspecified part of unspecified bronchus or lung: Secondary | ICD-10-CM | POA: Insufficient documentation

## 2020-08-13 DIAGNOSIS — I639 Cerebral infarction, unspecified: Secondary | ICD-10-CM | POA: Diagnosis not present

## 2020-08-13 MED ORDER — GADOBUTROL 1 MMOL/ML IV SOLN
5.0000 mL | Freq: Once | INTRAVENOUS | Status: AC | PRN
  Administered 2020-08-13: 5 mL via INTRAVENOUS

## 2020-08-13 NOTE — Telephone Encounter (Signed)
Noted, will continue to monitor

## 2020-08-13 NOTE — Telephone Encounter (Signed)
Colin Novak, from [facility], called stating that he visited pt to have PT  Eval and pt is declining Pt. Please advise.     [phone]

## 2020-08-21 ENCOUNTER — Telehealth: Payer: Self-pay | Admitting: Hospice and Palliative Medicine

## 2020-08-21 NOTE — Telephone Encounter (Signed)
I received a message from patient's daughter.  I called and spoke with her by phone.  She had some questions about how often home health nursing would visit with patient.  I encouraged her to speak with patient's home health nurse regarding current visit schedule.  We again discussed the option of hospice in the event that patient decides to stop treatment.  She says that patient has delayed refilling his afatinib in anticipation of talking to Dr. Tasia Catchings about his treatment goals.  She asked if I could see patient in the clinic on 11/1.

## 2020-08-23 ENCOUNTER — Inpatient Hospital Stay: Payer: Medicare Other

## 2020-08-23 ENCOUNTER — Other Ambulatory Visit: Payer: Medicare Other

## 2020-08-23 ENCOUNTER — Ambulatory Visit: Payer: Medicare Other | Admitting: Oncology

## 2020-08-23 ENCOUNTER — Ambulatory Visit: Payer: Medicare Other

## 2020-08-23 NOTE — Progress Notes (Signed)
Nutrition Follow-up:  Patient with stage IV non small cell lung cancer.  Patient talking afatinib.    Spoke with daughter [name] via phone for nutrition follow-up on patient.  [name] reports that patient is eating but has a sore mouth and throat.  Patient says that he ate some soup that was too hot that burned mouth.  Has been eating soft foods gravy biscuit, breakfast bowl, fried egg sandwich, boost shakes. Daughter reports that patient stopped taking megace because he thought it caused him to have diarrhea.    Daughter reports that they will meet with MD on Monday to determine further plan of care.      Medications: reviewed  Labs: reviewed  Anthropometrics:   Weight 100 lb on 10/14, no new weight since then   NUTRITION DIAGNOSIS:  Unintentional weight loss continues   INTERVENTION:  Daughter with questions regarding hospice. Offered to have NP call her but she will wait to Monday, Nov 1 to see NP and MD.   Encouraged high calorie, high protein soft foods. Will email handout on soft foods and rinse for patient to do in mouth.   Contact number provided    NEXT VISIT: no follow-up Daughter to contact RD as needed  Aamya Orellana B. Zenia Resides, [place], [place] Registered Dietitian [phone] (mobile)

## 2020-08-24 DIAGNOSIS — C3490 Malignant neoplasm of unspecified part of unspecified bronchus or lung: Secondary | ICD-10-CM | POA: Diagnosis not present

## 2020-08-24 DIAGNOSIS — E1165 Type 2 diabetes mellitus with hyperglycemia: Secondary | ICD-10-CM | POA: Diagnosis not present

## 2020-08-24 DIAGNOSIS — I9589 Other hypotension: Secondary | ICD-10-CM | POA: Diagnosis not present

## 2020-08-24 DIAGNOSIS — E43 Unspecified severe protein-calorie malnutrition: Secondary | ICD-10-CM | POA: Diagnosis not present

## 2020-08-27 ENCOUNTER — Inpatient Hospital Stay (HOSPITAL_BASED_OUTPATIENT_CLINIC_OR_DEPARTMENT_OTHER): Payer: Medicare Other | Admitting: Oncology

## 2020-08-27 ENCOUNTER — Other Ambulatory Visit: Payer: Self-pay

## 2020-08-27 ENCOUNTER — Encounter: Payer: Self-pay | Admitting: Oncology

## 2020-08-27 ENCOUNTER — Telehealth: Payer: Self-pay | Admitting: Primary Care

## 2020-08-27 ENCOUNTER — Inpatient Hospital Stay (HOSPITAL_BASED_OUTPATIENT_CLINIC_OR_DEPARTMENT_OTHER): Payer: Medicare Other | Admitting: Hospice and Palliative Medicine

## 2020-08-27 ENCOUNTER — Inpatient Hospital Stay: Payer: Medicare Other | Attending: Oncology

## 2020-08-27 ENCOUNTER — Other Ambulatory Visit: Payer: Self-pay | Admitting: Oncology

## 2020-08-27 ENCOUNTER — Inpatient Hospital Stay: Payer: Medicare Other

## 2020-08-27 ENCOUNTER — Other Ambulatory Visit (HOSPITAL_COMMUNITY): Payer: Self-pay | Admitting: Oncology

## 2020-08-27 VITALS — BP 89/58 | HR 97 | Temp 96.3°F | Resp 18 | Wt 104.6 lb

## 2020-08-27 DIAGNOSIS — R634 Abnormal weight loss: Secondary | ICD-10-CM

## 2020-08-27 DIAGNOSIS — Z7984 Long term (current) use of oral hypoglycemic drugs: Secondary | ICD-10-CM | POA: Diagnosis not present

## 2020-08-27 DIAGNOSIS — C7800 Secondary malignant neoplasm of unspecified lung: Secondary | ICD-10-CM | POA: Insufficient documentation

## 2020-08-27 DIAGNOSIS — R059 Cough, unspecified: Secondary | ICD-10-CM | POA: Diagnosis not present

## 2020-08-27 DIAGNOSIS — Z515 Encounter for palliative care: Secondary | ICD-10-CM | POA: Insufficient documentation

## 2020-08-27 DIAGNOSIS — E46 Unspecified protein-calorie malnutrition: Secondary | ICD-10-CM | POA: Insufficient documentation

## 2020-08-27 DIAGNOSIS — J449 Chronic obstructive pulmonary disease, unspecified: Secondary | ICD-10-CM | POA: Insufficient documentation

## 2020-08-27 DIAGNOSIS — C3480 Malignant neoplasm of overlapping sites of unspecified bronchus and lung: Secondary | ICD-10-CM

## 2020-08-27 DIAGNOSIS — Z794 Long term (current) use of insulin: Secondary | ICD-10-CM | POA: Diagnosis not present

## 2020-08-27 DIAGNOSIS — E785 Hyperlipidemia, unspecified: Secondary | ICD-10-CM | POA: Diagnosis not present

## 2020-08-27 DIAGNOSIS — R0602 Shortness of breath: Secondary | ICD-10-CM | POA: Insufficient documentation

## 2020-08-27 DIAGNOSIS — I959 Hypotension, unspecified: Secondary | ICD-10-CM | POA: Diagnosis not present

## 2020-08-27 DIAGNOSIS — F17210 Nicotine dependence, cigarettes, uncomplicated: Secondary | ICD-10-CM | POA: Diagnosis not present

## 2020-08-27 DIAGNOSIS — Z9221 Personal history of antineoplastic chemotherapy: Secondary | ICD-10-CM | POA: Insufficient documentation

## 2020-08-27 DIAGNOSIS — Z79899 Other long term (current) drug therapy: Secondary | ICD-10-CM | POA: Diagnosis not present

## 2020-08-27 DIAGNOSIS — R197 Diarrhea, unspecified: Secondary | ICD-10-CM

## 2020-08-27 DIAGNOSIS — I9589 Other hypotension: Secondary | ICD-10-CM | POA: Diagnosis not present

## 2020-08-27 DIAGNOSIS — Z66 Do not resuscitate: Secondary | ICD-10-CM | POA: Insufficient documentation

## 2020-08-27 DIAGNOSIS — E119 Type 2 diabetes mellitus without complications: Secondary | ICD-10-CM | POA: Diagnosis not present

## 2020-08-27 DIAGNOSIS — F419 Anxiety disorder, unspecified: Secondary | ICD-10-CM | POA: Diagnosis not present

## 2020-08-27 DIAGNOSIS — E876 Hypokalemia: Secondary | ICD-10-CM | POA: Diagnosis not present

## 2020-08-27 DIAGNOSIS — E861 Hypovolemia: Secondary | ICD-10-CM

## 2020-08-27 DIAGNOSIS — Z7982 Long term (current) use of aspirin: Secondary | ICD-10-CM | POA: Diagnosis not present

## 2020-08-27 DIAGNOSIS — K219 Gastro-esophageal reflux disease without esophagitis: Secondary | ICD-10-CM | POA: Insufficient documentation

## 2020-08-27 DIAGNOSIS — Z5111 Encounter for antineoplastic chemotherapy: Secondary | ICD-10-CM

## 2020-08-27 DIAGNOSIS — Z923 Personal history of irradiation: Secondary | ICD-10-CM | POA: Diagnosis not present

## 2020-08-27 DIAGNOSIS — K1230 Oral mucositis (ulcerative), unspecified: Secondary | ICD-10-CM

## 2020-08-27 LAB — CBC WITH DIFFERENTIAL/PLATELET
Abs Immature Granulocytes: 0.02 10*3/uL (ref 0.00–0.07)
Basophils Absolute: 0 10*3/uL (ref 0.0–0.1)
Basophils Relative: 1 %
Eosinophils Absolute: 0 10*3/uL (ref 0.0–0.5)
Eosinophils Relative: 0 %
HCT: 39.3 % (ref 39.0–52.0)
Hemoglobin: 13.9 g/dL (ref 13.0–17.0)
Immature Granulocytes: 1 %
Lymphocytes Relative: 17 %
Lymphs Abs: 0.7 10*3/uL (ref 0.7–4.0)
MCH: 33.9 pg (ref 26.0–34.0)
MCHC: 35.4 g/dL (ref 30.0–36.0)
MCV: 95.9 fL (ref 80.0–100.0)
Monocytes Absolute: 0.7 10*3/uL (ref 0.1–1.0)
Monocytes Relative: 16 %
Neutro Abs: 2.8 10*3/uL (ref 1.7–7.7)
Neutrophils Relative %: 65 %
Platelets: 216 10*3/uL (ref 150–400)
RBC: 4.1 MIL/uL — ABNORMAL LOW (ref 4.22–5.81)
RDW: 12.6 % (ref 11.5–15.5)
WBC: 4.3 10*3/uL (ref 4.0–10.5)
nRBC: 0 % (ref 0.0–0.2)

## 2020-08-27 LAB — COMPREHENSIVE METABOLIC PANEL
ALT: 13 U/L (ref 0–44)
AST: 19 U/L (ref 15–41)
Albumin: 3 g/dL — ABNORMAL LOW (ref 3.5–5.0)
Alkaline Phosphatase: 73 U/L (ref 38–126)
Anion gap: 8 (ref 5–15)
BUN: 12 mg/dL (ref 8–23)
CO2: 27 mmol/L (ref 22–32)
Calcium: 8.7 mg/dL — ABNORMAL LOW (ref 8.9–10.3)
Chloride: 101 mmol/L (ref 98–111)
Creatinine, Ser: 0.75 mg/dL (ref 0.61–1.24)
GFR, Estimated: >60 mL/min (ref >60)
Glucose, Bld: 317 mg/dL — ABNORMAL HIGH (ref 70–99)
Potassium: 4.1 mmol/L (ref 3.5–5.1)
Sodium: 136 mmol/L (ref 135–145)
Total Bilirubin: 0.3 mg/dL (ref 0.3–1.2)
Total Protein: 5.7 g/dL — ABNORMAL LOW (ref 6.5–8.1)

## 2020-08-27 MED ORDER — SODIUM CHLORIDE 0.9 % IV SOLN
Freq: Once | INTRAVENOUS | Status: AC
  Filled 2020-08-27: qty 250

## 2020-08-27 MED ORDER — AFATINIB DIMALEATE 30 MG PO TABS: 30.0000 mg | ORAL_TABLET | Freq: Every day | ORAL | 0 refills | Status: DC

## 2020-08-27 MED ORDER — LOPERAMIDE HCL 2 MG PO CAPS: 2.0000 mg | ORAL_CAPSULE | ORAL | 0 refills | Status: DC

## 2020-08-27 NOTE — Addendum Note (Signed)
Addended by: Earlie Server on: 08/27/2020 08:48 PM   Modules accepted: Orders

## 2020-08-27 NOTE — Progress Notes (Signed)
Pt reports redness, dry and sore mouth. Pt reports that he started having diarrhea over the weekend, took imodium AD, which worked. Started on [place] this weekend. bp today is 89/58

## 2020-08-27 NOTE — Progress Notes (Signed)
[place]  [phone] [phone]   Name: Colin Novak Date: 08/27/2020 MRN: [id number]  DOB: [birth year]  Patient Care Team: Guadalupe Maple, MD as PCP - General (Family Medicine) Gabriel Carina, Betsey Holiday, MD as Physician Assistant (Endocrinology) Kem Parkinson, MD (Ophthalmology) Telford Nab, RN as Registered Nurse Tamala Julian, Jonette Eva, NP as Nurse Practitioner Noreene Filbert, MD as Radiation Oncologist (Radiation Oncology)    REASON FOR CONSULTATION: Palliative Care consult requested for this 74 y.o. male with multiple medical problems including stage IV non-small cell lung cancer who initially refused chemotherapy/immunotherapy but received palliative XRT to the right upper lobe as mass is obstructing his bronchus.    PET scan on 08/02/2020 revealed disease progression in the lungs.  Patient was started on afatinib.  Patient was referred to palliative care to help address goals and manage ongoing symptoms.  SOCIAL HISTORY:     reports that he has been smoking cigarettes. He has been smoking about 1.00 pack per day. He quit smokeless tobacco use about 61 years ago.  His smokeless tobacco use included chew. He reports that he does not drink alcohol and does not use drugs.   Patient is a widower.  He lives at home alone with his dog.  He has a daughter who is involved in his care.  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  ADVANCE DIRECTIVES:  Not on file  CODE STATUS: DNR (DNR form signed on 09/01/2019 and again on 08/09/2020)  PAST MEDICAL HISTORY: Past Medical History:  Diagnosis Date  . Anxiety   . COPD (chronic obstructive pulmonary disease) ([place])   . Diabetes mellitus without complication ([place])    type 2  . GERD (gastroesophageal reflux disease)   . Hyperlipidemia     PAST SURGICAL HISTORY:  Past Surgical History:  Procedure Laterality Date  . APPENDECTOMY    . ENDOBRONCHIAL  ULTRASOUND Bilateral 06/13/2019   Procedure: ENDOBRONCHIAL ULTRASOUND, BILATERAL;  Surgeon: [name], MD;  Location: [facility] ORS;  Service: Pulmonary;  Laterality: Bilateral;  . EYE SURGERY Bilateral    cataract    HEMATOLOGY/ONCOLOGY HISTORY:  Oncology History  Malignant neoplasm of lung ([place])  06/20/2019 Initial Diagnosis   Non-small cell lung cancer ([place])   01/16/2020 - 01/16/2020 Chemotherapy   The patient had dexamethasone (DECADRON) 4 MG tablet, 0 of 1 cycle, Start date: --, End date: -- palonosetron (ALOXI) injection 0.25 mg, 0.25 mg, Intravenous,  Once, 0 of 4 cycles PEMEtrexed (ALIMTA) 800 mg in sodium chloride 0.9 % 100 mL chemo infusion, 500 mg/m2, Intravenous,  Once, 0 of 4 cycles CARBOplatin (PARAPLATIN) in sodium chloride 0.9 % 100 mL chemo infusion, , Intravenous,  Once, 0 of 4 cycles fosaprepitant (EMEND) 150 mg in sodium chloride 0.9 % 145 mL IVPB, 150 mg, Intravenous,  Once, 0 of 4 cycles pembrolizumab (KEYTRUDA) 200 mg in sodium chloride 0.9 % 50 mL chemo infusion, 200 mg (original dose ), Intravenous, Once, 0 of 4 cycles Dose modification: 200 mg (Cycle 1, Reason: Other (see comments))  for chemotherapy treatment.    01/23/2020 - 01/23/2020 Chemotherapy   The patient had dexamethasone (DECADRON) 4 MG tablet, 1 of 1 cycle, Start date: 06/20/2019, End date: 01/16/2020 palonosetron (ALOXI) injection 0.25 mg, 0.25 mg, Intravenous,  Once, 0 of 4 cycles PEMEtrexed (ALIMTA) 775 mg in sodium chloride 0.9 % 100 mL chemo infusion, 500 mg/m2, Intravenous,  Once, 0 of 4 cycles CARBOplatin (PARAPLATIN) in sodium chloride 0.9 %  100 mL chemo infusion, , Intravenous,  Once, 0 of 4 cycles fosaprepitant (EMEND) 150 mg, dexamethasone (DECADRON) 12 mg in sodium chloride 0.9 % 145 mL IVPB, , Intravenous,  Once, 0 of 4 cycles  for chemotherapy treatment.    07/26/2020 -  Chemotherapy   The patient had [No matching medication found in this treatment plan]  for chemotherapy treatment.       ALLERGIES:  is allergic to penicillins.  MEDICATIONS:  Current Outpatient Medications  Medication Sig Dispense Refill  . afatinib dimaleate (GILOTRIF) 30 MG tablet Take 1 tablet (30 mg total) by mouth daily. Take on an empty stomach 1hr before or 2hrs after meals. 30 tablet 0  . albuterol (PROAIR HFA) 108 (90 Base) MCG/ACT inhaler Inhale 1-2 puffs into the lungs every 6 (six) hours as needed for wheezing or shortness of breath (use as needed for chest congestion.). 1 g 3  . aspirin EC 81 MG tablet Take 81 mg by mouth daily.    . citalopram (CELEXA) 20 MG tablet Take 1 tablet (20 mg total) by mouth daily. 90 tablet 4  . Dextromethorphan-guaiFENesin (ROBITUSSIN DM PO) Take 5 mLs by mouth at bedtime.     Marland Kitchen ezetimibe-simvastatin (VYTORIN) 10-40 MG tablet Take 1 tablet by mouth daily. 90 tablet 4  . fludrocortisone (FLORINEF) 0.1 MG tablet Take 100 mcg by mouth daily.    . Fluticasone-Umeclidin-Vilant (TRELEGY ELLIPTA) 100-62.5-25 MCG/INH AEPB Inhale 1 applicator into the lungs daily. Rinse mouth after use. 1 each 10  . glipiZIDE (GLUCOTROL XL) 5 MG 24 hr tablet Take by mouth.    Marland Kitchen glucose blood (ONE TOUCH ULTRA TEST) test strip 1 each by Other route 2 (two) times daily as needed for other. Dx: E11.9 100 each 12  . HUMALOG KWIKPEN 100 UNIT/ML KwikPen 10 Units. Sliding scale up to 12 units    . LANTUS SOLOSTAR 100 UNIT/ML Solostar Pen INJECT 10-20 UNITS INTO THE SKIN DAILY 5 pen 12  . megestrol (MEGACE) 400 MG/10ML suspension Take 10 mLs (400 mg total) by mouth daily. 240 mL 0  . metFORMIN (GLUCOPHAGE) 500 MG tablet TAKE ONE TABLET BY MOUTH TWICE DAILY 60 tablet 12  . Multiple Vitamin (MULTIVITAMIN) tablet Take 1 tablet by mouth daily.     Marland Kitchen NOVOFINE 32G X 6 MM MISC USE AS DIRECTED. 5 INJECTIONS A DAY 900 each 0  . omeprazole (PRILOSEC) 20 MG capsule Take 1 capsule (20 mg total) by mouth daily. (Patient not taking: Reported on 07/26/2020) 30 capsule 11  . oxymetazoline (AFRIN) 0.05 % nasal spray  Place 2 sprays into both nostrils 2 (two) times daily as needed.  (Patient not taking: Reported on 08/27/2020)    . pioglitazone (ACTOS) 30 MG tablet Take 30 mg by mouth daily.   1  . pseudoephedrine-acetaminophen (TYLENOL SINUS) 30-500 MG TABS tablet Take 2 tablets by mouth every 4 (four) hours as needed. Only using twice daily (Patient not taking: Reported on 07/31/2020)    . triamcinolone (NASACORT) 55 MCG/ACT AERO nasal inhaler Place 1 spray into the nose 2 (two) times daily. (Patient not taking: Reported on 02/03/2020) 1 Inhaler 2   No current facility-administered medications for this visit.    VITAL SIGNS: There were no vitals taken for this visit. There were no vitals filed for this visit.  Estimated body mass index is 16.88 kg/m as calculated from the following:   Height as of 02/03/20: 5\' 6"  (1.676 m).   Weight as of an earlier encounter on 08/27/20: 104  lb 9.6 oz (47.4 kg).  LABS: CBC:    Component Value Date/Time   WBC 4.3 08/27/2020 1324   HGB 13.9 08/27/2020 1324   HGB 13.2 12/06/2018 0857   HCT 39.3 08/27/2020 1324   HCT 38.6 12/06/2018 0857   PLT 216 08/27/2020 1324   PLT 266 12/06/2018 0857   MCV 95.9 08/27/2020 1324   MCV 96 12/06/2018 0857   NEUTROABS 2.8 08/27/2020 1324   NEUTROABS 1.7 12/06/2018 0857   LYMPHSABS 0.7 08/27/2020 1324   LYMPHSABS 1.6 12/06/2018 0857   MONOABS 0.7 08/27/2020 1324   EOSABS 0.0 08/27/2020 1324   EOSABS 0.1 12/06/2018 0857   BASOSABS 0.0 08/27/2020 1324   BASOSABS 0.1 12/06/2018 0857   Comprehensive Metabolic Panel:    Component Value Date/Time   NA 136 08/27/2020 1324   NA 140 12/06/2018 0857   K 4.1 08/27/2020 1324   CL 101 08/27/2020 1324   CO2 27 08/27/2020 1324   BUN 12 08/27/2020 1324   BUN 9 12/06/2018 0857   CREATININE 0.75 08/27/2020 1324   GLUCOSE 317 (H) 08/27/2020 1324   CALCIUM 8.7 (L) 08/27/2020 1324   AST 19 08/27/2020 1324   AST 55 (H) 04/01/2017 0907   ALT 13 08/27/2020 1324   ALT 48 (H) 04/01/2017 0907     ALKPHOS 73 08/27/2020 1324   BILITOT 0.3 08/27/2020 1324   BILITOT <0.2 12/06/2018 0857   PROT 5.7 (L) 08/27/2020 1324   PROT 6.4 12/06/2018 0857   ALBUMIN 3.0 (L) 08/27/2020 1324   ALBUMIN 4.1 12/06/2018 0857    RADIOGRAPHIC STUDIES: MR Brain W Wo Contrast  Result Date: 08/13/2020 CLINICAL DATA:  Lung cancer follow-up EXAM: MRI HEAD WITHOUT AND WITH CONTRAST TECHNIQUE: Multiplanar, multiecho pulse sequences of the brain and surrounding structures were obtained without and with intravenous contrast. CONTRAST:  3mL GADAVIST GADOBUTROL 1 MMOL/ML IV SOLN COMPARISON:  March 2021 FINDINGS: Brain: There is a new punctate focus of enhancement along the posterior left frontal lobe involving cortex of the left superior frontal gyrus (series 13, image 134). There is no acute infarction or intracranial hemorrhage. There is no hydrocephalus. 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 Some of these areas demonstrate associated susceptibility likely reflecting chronic blood products. Other areas of patchy T2 hyperintensity in the supratentorial white matter likely reflects stable chronic microvascular ischemic changes. Vascular: Major vessel flow voids at the skull base are preserved. Skull and upper cervical spine: Normal marrow signal is preserved. Sinuses/Orbits: Paranasal sinuses are aerated. Orbits are unremarkable. Other: Sella is unremarkable.  Mastoid air cells are clear. IMPRESSION: New punctate focus of enhancement in the posterior left frontal lobe. This could reflect a small metastatic lesion and attention on follow-up is recommended. Stable chronic findings detailed above. Electronically Signed   By: Macy Mis M.D.   On: 08/13/2020 16:44   NM PET Image Restag (PS) Skull  Base To Thigh  Result Date: 08/02/2020 CLINICAL DATA:  Subsequent treatment strategy for non-small cell lung cancer. Ongoing chemotherapy. EXAM: NUCLEAR MEDICINE PET SKULL BASE TO THIGH TECHNIQUE: 5.7 mCi F-18 FDG was injected intravenously. Full-ring PET imaging was performed from the skull base to thigh after the radiotracer. CT data was obtained and used for attenuation correction and anatomic localization. Fasting blood glucose: 242 mg/dl COMPARISON:  07/24/2020 chest CT.  01/23/2020 PET-CT. FINDINGS: Mediastinal blood  pool activity: SUV max 1.9 Liver activity: SUV max NA NECK: No hypermetabolic lymph nodes in the neck. Incidental CT findings: none CHEST: No recurrent hypermetabolism at the site of masslike fibrosis in paramediastinal right upper lobe measuring 5.2 x 2.8 cm (series 3/image 83). Mild hypermetabolism associated with spiculated irregular posterior left upper lobe 3.7 cm lung mass with max SUV 3.0 (series 3/image 74), previously 3.6 cm with max SUV 3.7 on 01/23/2020 PET-CT using similar measurement technique, not appreciably changed in size or metabolism. Numerous (greater than 10) irregular pulmonary nodules scattered throughout the left greater than right lungs, all increased or new since 01/23/2020 PET-CT study, none of which demonstrate significant FDG uptake, noting that several are subcentimeter and below PET resolution. Representative spiculated 2.2 cm superior segment left lower lobe nodule (series 3/image 98), increased from 1.8 cm on 01/23/2020 PET-CT. Representative 0.9 cm posterior right lower lobe nodule (series 3/image 129), new. No enlarged or hypermetabolic axillary, mediastinal or hilar lymph nodes. Incidental CT findings: Coronary atherosclerosis. Atherosclerotic thoracic aorta with ectatic 4.2 cm ascending thoracic aorta, unchanged. Severe centrilobular emphysema with diffuse bronchial wall thickening. ABDOMEN/PELVIS: No abnormal hypermetabolic activity within the liver,  pancreas, adrenal glands, or spleen. No hypermetabolic lymph nodes in the abdomen or pelvis. Incidental CT findings: Stable low-attenuation 3.9 cm exophytic lesion in the lower left kidney, unchanged and without appreciable FDG uptake. Atherosclerotic nonaneurysmal abdominal aorta. Mild prostatomegaly. SKELETON: No focal hypermetabolic activity to suggest skeletal metastasis. Incidental CT findings: none IMPRESSION: 1. Mildly hypermetabolic left upper lobe 3.7 cm lung mass, not appreciably changed in size or metabolism since 01/23/2020 PET-CT. 2. Numerous (greater than 10) additional irregular pulmonary nodules scattered throughout the left greater than right lungs, all increased or new since 01/23/2020 PET-CT study, none demonstrating significant FDG uptake. Findings are most compatible with progression of metastatic disease. 3. No recurrent hypermetabolism at the site of masslike fibrosis in the right upper lobe. No recurrent hypermetabolic thoracic adenopathy. 4. No hypermetabolic metastatic disease in the abdomen, pelvis or skeleton. 5. Chronic findings include: Aortic Atherosclerosis (ICD10-I70.0) and Emphysema (ICD10-J43.9). Coronary atherosclerosis. Ectatic 4.2 cm ascending thoracic aorta. Mild prostatomegaly. Electronically Signed   By: Ilona Sorrel M.D.   On: 08/02/2020 15:17    PERFORMANCE STATUS (ECOG) : 1 - Symptomatic but completely ambulatory  Review of Systems Unless otherwise noted, a complete review of systems is negative.  Physical Exam General: NAD, frail appearing, thin Pulmonary: Unlabored Extremities: no edema Skin: no rashes Neurological: Weakness but otherwise nonfocal  IMPRESSION: Routine follow-up visit.  Patient seen in the infusion area.  MRI of the brain on 08/13/2020 revealed small lesion in the posterior left frontal lobe concerning for possible metastatic disease.  Patient saw Dr. Tasia Catchings today and reports that he has made the decision to continue receiving systemic  treatment.  He reports that he is feeling optimistic today.  Patient continues to require supportive care at home.  Home health has been ordered.  We will also order home-based palliative care.  PLAN: -Continue current scope of treatment -Continue home health -Referral for home palliative care -RTC 1-2 weeks   Patient expressed understanding and was in agreement with this plan. He also understands that He can call the clinic at any time with any questions, concerns, or complaints.     Time Total: 15 minutes  Visit consisted of counseling and education dealing with the complex and emotionally intense issues of symptom management and palliative care in the setting of serious and potentially life-threatening illness.Greater than 50%  of this time was spent counseling and coordinating care related to the above assessment and plan.  Signed by: Altha Harm, PhD, NP-C

## 2020-08-27 NOTE — Telephone Encounter (Signed)
Called patient and spoke with daughter, Chandra Batch, regarding Palliative referral/services and she and patient were in agreement with scheduling visit.  I have scheduled an In-person Consult for 08/28/20 @ 10 AM.

## 2020-08-27 NOTE — Progress Notes (Signed)
Hematology/Oncology follow up note [facility] Telephone:(336) [phone] Fax:([phone]) [phone]   Patient Care Team: Guadalupe Maple, MD as PCP - General (Family Medicine) Gabriel Carina, Betsey Holiday, MD as Physician Assistant (Endocrinology) Kem Parkinson, MD (Ophthalmology) Telford Nab, RN as Registered Nurse Tamala Julian, Jonette Eva, NP as Nurse Practitioner Colin Filbert, MD as Radiation Oncologist (Radiation Oncology)  REFERRING PROVIDER: Guadalupe Maple, MD  CHIEF COMPLAINTS/REASON FOR VISIT:  Follow up for non-small cell lung cancer  HISTORY OF PRESENTING ILLNESS:  Colin Novak is a  74 y.o.  male with PMH listed below was seen in consultation at the request of  Guadalupe Maple, MD  for evaluation of lung mass Patient reports ongoing shortness of breath, cough for  few months. He had CT chest scan done on 06/01/2019 which showed a concerning feature of 9.1 x 5.1 x 3.6 cm right upper lobe mass extending into mediastinum, encasing and occluding right upper lobe bronchus and some of the right upper lobe bronchial branches.  There is also significant narrowing of FVC. He also had a similar appearance of left upper lobe mass without mediastinal extension suspicious for a synchronized primary lung cancer Multiple additional smaller bilateral lung nodules, left greater than right. Mildly enlarged right hilar and mediastinal lymph nodes suspicious for metastatic lymphadenopathy. 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 Daughter [name] is his power of attorney. Denies fever or chills.  # patient was seen by me on 06/20/2019.  At that time, patient has 2 separate lung tumor.  Possibility of stage IV lung adenocarcinoma TX N1 M1 discussed with patient given that he has tumor in the contralateral lobe.  Systemic chemotherapy with immunotherapy was recommended.  Patient declined at  that time. He may have 2 separate primary lung cancer, if that is the case, he has stage III lung cancer.  would benefit from definitive concurrent chemoradiation followed by immunotherapy for maintenance. Patient has decided at that time that he does not want any systemic chemotherapy.   08/05/2019 He finished radiation to right upper lobe lesion.  09/16/2019 s/p radiation boost  01/06/2020 CT scan showed interval increase of the right para mediastinal and hilar mass and also multiple areas of increased density in the left lower lobe consistent with multifocal adenocarcinoma/metastasis.  #01/23/2020, PET scan showed a decreased left upper lobe and right upper lobe lesion. There is still occlusion of the right upper lobe bronchus with some associated atelectasis.Enlarged left lung nodules have not had increased metabolic activity and remained well below blood pool.New sclerosis in the upper sternal body comparing to previous PET scan. -Patient was not interested in any systemic treatment.  Declined immunotherapy maintenance.  NGS was sent.  he follows up with palliative care service.  # 08/02/2020 PET scan images were reviewed  No recurrent hypermetabolism at the site of masslike fibrosis in paramediastinal right upper lobe  Mildly hypermetabolic left upper lobe 7.4FS mass, not changed since last PET 6 months ago,  Numerous (>10) irregular pulmonary nodules scattered through out bilateral lungs,increased or new from last study. Non has significant FDG uptake, compatible with progression of metastatic disease. No recurrent hypermetabolism at the site of masslike fibrosis in the right upper lobe. No recurrent hypermetabolic thoracic Adenopathy. No hypermetabolic metastatic disease in the abdomen, pelvis or Skeleton  INTERVAL HISTORY ASER Novak is a 74 y.o. male who has above history reviewed by me today presents for follow up  visit for management of  non-small cell lung cancer. Problems and  complaints are listed below: He has been started on Afatinib since 07/31/2020.  He reports diarrhea that started 2-3 days ago. He tool Immodium 1-2 tablets with symptom relief. No loose bowel movement today.  Also dry mouth and pain.  Chronic fatigue, no change.  Has gained weight.    Review of Systems  Constitutional: Positive for fatigue. Negative for appetite change, chills, diaphoresis, fever and unexpected weight change.  HENT:   Negative for hearing loss, lump/mass, nosebleeds, sore throat and voice change.        Mouth pain  Eyes: Negative for eye problems and icterus.  Respiratory: Negative for chest tightness, cough, hemoptysis, shortness of breath and wheezing.   Cardiovascular: Negative for chest pain and leg swelling.  Gastrointestinal: Positive for diarrhea. Negative for abdominal distention, abdominal pain, blood in stool, nausea and rectal pain.  Endocrine: Negative for hot flashes.  Genitourinary: Negative for bladder incontinence, difficulty urinating, dysuria, frequency, hematuria and nocturia.   Musculoskeletal: Positive for back pain. Negative for arthralgias, flank pain, gait problem and myalgias.  Skin: Negative for itching and rash.  Neurological: Negative for dizziness, gait problem, headaches, light-headedness, numbness and seizures.  Hematological: Negative for adenopathy. Does not bruise/bleed easily.  Psychiatric/Behavioral: Negative for confusion and decreased concentration. The patient is not nervous/anxious.     MEDICAL HISTORY:  Past Medical History:  Diagnosis Date  . Anxiety   . COPD (chronic obstructive pulmonary disease) ([place])   . Diabetes mellitus without complication ([place])    type 2  . GERD (gastroesophageal reflux disease)   . Hyperlipidemia     SURGICAL HISTORY: Past Surgical History:  Procedure Laterality Date  . APPENDECTOMY    . ENDOBRONCHIAL ULTRASOUND Bilateral 06/13/2019   Procedure: ENDOBRONCHIAL ULTRASOUND, BILATERAL;  Surgeon:  [name], MD;  Location: [facility] ORS;  Service: Pulmonary;  Laterality: Bilateral;  . EYE SURGERY Bilateral    cataract    SOCIAL HISTORY: Social History   Socioeconomic History  . Marital status: Widowed    Spouse name: Not on file  . Number of children: 1  . Years of education: Not on file  . Highest education level: High school graduate  Occupational History  . Occupation: retired  Tobacco Use  . Smoking status: Current Every Day Smoker    Packs/day: 1.00    Types: Cigarettes  . Smokeless tobacco: Former Systems developer    Types: Chew    Quit date: 06/10/1959  . Tobacco comment: 1 pack daily- 06/09/2019  Vaping Use  . Vaping Use: Never used  Substance and Sexual Activity  . Alcohol use: No  . Drug use: No  . Sexual activity: Not Currently  Other Topics Concern  . Not on file  Social History Narrative  . Not on file   Social Determinants of Health   Financial Resource Strain:   . Difficulty of Paying Living Expenses: Not on file  Food Insecurity:   . Worried About Charity fundraiser in the Last Year: Not on file  . Ran Out of Food in the Last Year: Not on file  Transportation Needs:   . Lack of Transportation (Medical): Not on file  . Lack of Transportation (Non-Medical): Not on file  Physical Activity:   . Days of Exercise per Week: Not on file  . Minutes of Exercise per Session: Not on file  Stress:   . Feeling of Stress : Not on file  Social Connections:   .  Frequency of Communication with Friends and Family: Not on file  . Frequency of Social Gatherings with Friends and Family: Not on file  . Attends Religious Services: Not on file  . Active Member of Clubs or Organizations: Not on file  . Attends Archivist Meetings: Not on file  . Marital Status: Not on file  Intimate Partner Violence:   . Fear of Current or Ex-Partner: Not on file  . Emotionally Abused: Not on file  . Physically Abused: Not on file  . Sexually Abused: Not on file     FAMILY HISTORY: Family History  Problem Relation Age of Onset  . Cancer Mother        lung (non smoker)  . Diabetes Father   . Hypertension Father   . Lymphoma Brother   . Prostate cancer Brother     ALLERGIES:  is allergic to penicillins.  MEDICATIONS:  Current Outpatient Medications  Medication Sig Dispense Refill  . albuterol (PROAIR HFA) 108 (90 Base) MCG/ACT inhaler Inhale 1-2 puffs into the lungs every 6 (six) hours as needed for wheezing or shortness of breath (use as needed for chest congestion.). 1 g 3  . aspirin EC 81 MG tablet Take 81 mg by mouth daily.    . citalopram (CELEXA) 20 MG tablet Take 1 tablet (20 mg total) by mouth daily. 90 tablet 4  . Dextromethorphan-guaiFENesin (ROBITUSSIN DM PO) Take 5 mLs by mouth at bedtime.     Marland Kitchen ezetimibe-simvastatin (VYTORIN) 10-40 MG tablet Take 1 tablet by mouth daily. 90 tablet 4  . fludrocortisone (FLORINEF) 0.1 MG tablet Take 100 mcg by mouth daily.    . Fluticasone-Umeclidin-Vilant (TRELEGY ELLIPTA) 100-62.5-25 MCG/INH AEPB Inhale 1 applicator into the lungs daily. Rinse mouth after use. 1 each 10  . glipiZIDE (GLUCOTROL XL) 5 MG 24 hr tablet Take by mouth.    Marland Kitchen glucose blood (ONE TOUCH ULTRA TEST) test strip 1 each by Other route 2 (two) times daily as needed for other. Dx: E11.9 100 each 12  . HUMALOG KWIKPEN 100 UNIT/ML KwikPen 10 Units. Sliding scale up to 12 units    . LANTUS SOLOSTAR 100 UNIT/ML Solostar Pen INJECT 10-20 UNITS INTO THE SKIN DAILY 5 pen 12  . megestrol (MEGACE) 400 MG/10ML suspension Take 10 mLs (400 mg total) by mouth daily. 240 mL 0  . metFORMIN (GLUCOPHAGE) 500 MG tablet TAKE ONE TABLET BY MOUTH TWICE DAILY 60 tablet 12  . Multiple Vitamin (MULTIVITAMIN) tablet Take 1 tablet by mouth daily.     Marland Kitchen NOVOFINE 32G X 6 MM MISC USE AS DIRECTED. 5 INJECTIONS A DAY 900 each 0  . pioglitazone (ACTOS) 30 MG tablet Take 30 mg by mouth daily.   1  . afatinib dimaleate (GILOTRIF) 30 MG tablet Take 1 tablet (30 mg  total) by mouth daily. Take on an empty stomach 1hr before or 2hrs after meals. 30 tablet 0  . omeprazole (PRILOSEC) 20 MG capsule Take 1 capsule (20 mg total) by mouth daily. (Patient not taking: Reported on 07/26/2020) 30 capsule 11  . oxymetazoline (AFRIN) 0.05 % nasal spray Place 2 sprays into both nostrils 2 (two) times daily as needed.  (Patient not taking: Reported on 08/27/2020)    . pseudoephedrine-acetaminophen (TYLENOL SINUS) 30-500 MG TABS tablet Take 2 tablets by mouth every 4 (four) hours as needed. Only using twice daily (Patient not taking: Reported on 07/31/2020)    . triamcinolone (NASACORT) 55 MCG/ACT AERO nasal inhaler Place 1 spray into the nose  2 (two) times daily. (Patient not taking: Reported on 02/03/2020) 1 Inhaler 2   No current facility-administered medications for this visit.     PHYSICAL EXAMINATION: ECOG PERFORMANCE STATUS: 2 - Symptomatic, <50% confined to bed Vitals:   08/27/20 1351  BP: (!) 89/58  Pulse: 97  Resp: 18  Temp: (!) 96.3 F (35.7 C)  SpO2: 97%   Filed Weights   08/27/20 1351  Weight: 104 lb 9.6 oz (47.4 kg)    Physical Exam Constitutional:      General: He is not in acute distress.    Appearance: He is ill-appearing.     Comments: Cachectic  HENT:     Head: Normocephalic and atraumatic.  Eyes:     General: No scleral icterus.    Pupils: Pupils are equal, round, and reactive to light.  Neck:     Comments: No neck swelling Cardiovascular:     Rate and Rhythm: Normal rate and regular rhythm.     Heart sounds: Normal heart sounds.  Pulmonary:     Effort: Pulmonary effort is normal. No respiratory distress.     Breath sounds: No wheezing.     Comments: Decreased breath sounds bilaterally.  Abdominal:     General: Bowel sounds are normal. There is no distension.     Palpations: Abdomen is soft. There is no mass.  Musculoskeletal:        General: No deformity. Normal range of motion.     Cervical back: Normal range of motion and neck  supple.     Comments: Clubbing  Skin:    General: Skin is warm and dry.     Findings: No erythema or rash.  Neurological:     General: No focal deficit present.     Mental Status: He is alert and oriented to person, place, and time. Mental status is at baseline.     Cranial Nerves: No cranial nerve deficit.     Coordination: Coordination normal.  Psychiatric:        Mood and Affect: Mood normal.     LABORATORY DATA:  I have reviewed the data as listed Lab Results  Component Value Date   WBC 4.3 08/27/2020   HGB 13.9 08/27/2020   HCT 39.3 08/27/2020   MCV 95.9 08/27/2020   PLT 216 08/27/2020   Recent Labs    09/02/19 0932 01/06/20 1417 01/16/20 0942 07/24/20 1104 07/26/20 1001 07/26/20 1001 07/31/20 1059 08/09/20 0854 08/27/20 1324  NA 135  --  132*  --  130*   < > 139 135 136  K 4.8  --  4.9  --  4.4   < > 4.3 3.9 4.1  CL 101  --  98  --  92*   < > 102 101 101  CO2 26  --  24  --  26   < > 28 24 27   GLUCOSE 149*  --  460*  --  395*   < > 132* 318* 317*  BUN 14  --  13  --  15   < > 11 17 12   CREATININE 0.65   < > 0.80   < > 0.77   < > 0.55* 0.63 0.75  CALCIUM 9.1  --  9.3  --  9.0   < > 8.9 9.1 8.7*  GFRNONAA >60  --  >60  --  >60   < > >60 >60 >60  GFRAA >60  --  >60  --  >60  --   --   --   --  PROT 7.3  --  7.7  --  7.1  --   --  7.1 5.7*  ALBUMIN 4.1  --  4.4  --  4.0  --   --  4.0 3.0*  AST 26  --  30  --  15  --   --  32 19  ALT 26  --  33  --  12  --   --  37 13  ALKPHOS 80  --  110  --  93  --   --  85 73  BILITOT 0.7  --  0.9  --  1.0  --   --  0.7 0.3   < > = values in this interval not displayed.   Iron/TIBC/Ferritin/ %Sat No results found for: IRON, TIBC, FERRITIN, IRONPCTSAT    RADIOGRAPHIC STUDIES: I have personally reviewed the radiological images as listed and agreed with the findings in the report.  MR Brain W Wo Contrast  Result Date: 08/13/2020 CLINICAL DATA:  Lung cancer follow-up EXAM: MRI HEAD WITHOUT AND WITH CONTRAST TECHNIQUE:  Multiplanar, multiecho pulse sequences of the brain and surrounding structures were obtained without and with intravenous contrast. CONTRAST:  48m GADAVIST GADOBUTROL 1 MMOL/ML IV SOLN COMPARISON:  March 2021 FINDINGS: Brain: There is a new punctate focus of enhancement along the posterior left frontal lobe involving cortex of the left superior frontal gyrus (series 13, image 134). There is no acute infarction or intracranial hemorrhage. There is no hydrocephalus. 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 Some of these areas demonstrate associated susceptibility likely reflecting chronic blood products. Other areas of patchy T2 hyperintensity in the supratentorial white matter likely reflects stable chronic microvascular ischemic changes. Vascular: Major vessel flow voids at the skull base are preserved. Skull and upper cervical spine: Normal marrow signal is preserved. Sinuses/Orbits: Paranasal sinuses are aerated. Orbits are unremarkable. Other: Sella is unremarkable.  Mastoid air cells are clear. IMPRESSION: New punctate focus of enhancement in the posterior left frontal lobe. This could reflect a small metastatic lesion and attention on follow-up is recommended. Stable chronic findings detailed above. Electronically Signed   By: PMacy MisM.D.   On: 08/13/2020 16:44   NM PET Image Restag (PS) Skull Base To Thigh  Result Date: 08/02/2020 CLINICAL DATA:  Subsequent treatment strategy for non-small cell lung cancer. Ongoing chemotherapy. EXAM: NUCLEAR MEDICINE PET SKULL BASE TO THIGH TECHNIQUE: 5.7 mCi F-18 FDG was injected intravenously. Full-ring PET imaging was performed from the skull base to thigh after the radiotracer. CT data was obtained and used for attenuation correction and  anatomic localization. Fasting blood glucose: 242 mg/dl COMPARISON:  07/24/2020 chest CT.  01/23/2020 PET-CT. FINDINGS: Mediastinal blood pool activity: SUV max 1.9 Liver activity: SUV max NA NECK: No hypermetabolic lymph nodes in the neck. Incidental CT findings: none CHEST: No recurrent hypermetabolism at the site of masslike fibrosis in paramediastinal right upper lobe measuring 5.2 x 2.8 cm (series 3/image 83). Mild hypermetabolism associated with spiculated irregular posterior left upper lobe 3.7 cm lung mass with max SUV 3.0 (series 3/image 74), previously 3.6 cm with max SUV 3.7 on 01/23/2020 PET-CT using similar measurement technique, not appreciably changed in size or metabolism. Numerous (greater than 10) irregular pulmonary nodules scattered throughout the left greater than right lungs, all increased or new since 01/23/2020 PET-CT study, none of  which demonstrate significant FDG uptake, noting that several are subcentimeter and below PET resolution. Representative spiculated 2.2 cm superior segment left lower lobe nodule (series 3/image 98), increased from 1.8 cm on 01/23/2020 PET-CT. Representative 0.9 cm posterior right lower lobe nodule (series 3/image 129), new. No enlarged or hypermetabolic axillary, mediastinal or hilar lymph nodes. Incidental CT findings: Coronary atherosclerosis. Atherosclerotic thoracic aorta with ectatic 4.2 cm ascending thoracic aorta, unchanged. Severe centrilobular emphysema with diffuse bronchial wall thickening. ABDOMEN/PELVIS: No abnormal hypermetabolic activity within the liver, pancreas, adrenal glands, or spleen. No hypermetabolic lymph nodes in the abdomen or pelvis. Incidental CT findings: Stable low-attenuation 3.9 cm exophytic lesion in the lower left kidney, unchanged and without appreciable FDG uptake. Atherosclerotic nonaneurysmal abdominal aorta. Mild prostatomegaly. SKELETON: No focal hypermetabolic activity to suggest skeletal metastasis. Incidental CT  findings: none IMPRESSION: 1. Mildly hypermetabolic left upper lobe 3.7 cm lung mass, not appreciably changed in size or metabolism since 01/23/2020 PET-CT. 2. Numerous (greater than 10) additional irregular pulmonary nodules scattered throughout the left greater than right lungs, all increased or new since 01/23/2020 PET-CT study, none demonstrating significant FDG uptake. Findings are most compatible with progression of metastatic disease. 3. No recurrent hypermetabolism at the site of masslike fibrosis in the right upper lobe. No recurrent hypermetabolic thoracic adenopathy. 4. No hypermetabolic metastatic disease in the abdomen, pelvis or skeleton. 5. Chronic findings include: Aortic Atherosclerosis (ICD10-I70.0) and Emphysema (ICD10-J43.9). Coronary atherosclerosis. Ectatic 4.2 cm ascending thoracic aorta. Mild prostatomegaly. Electronically Signed   By: Ilona Sorrel M.D.   On: 08/02/2020 15:17      ASSESSMENT & PLAN:  1. Malignant neoplasm of overlapping sites of lung, unspecified laterality ([place])   2. Protein-calorie malnutrition, unspecified severity ([place])   3. Encounter for antineoplastic chemotherapy   4. Hypotension due to hypovolemia   5. Mucositis   6. Diarrhea, unspecified type   7. 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  NGS EGFR G719S+, [id number], PD-L1 30% Labs reviewed and discussed with patient. Continue afatinib 30 mg daily  #Mucositis, likely secondary to chemotherapy. Recommend patient to use Magic mouthwash swish and spit  #Diarrhea, chemotherapy-induced. Symptoms seems to have improved since the use of Imodium. I discussed in details about Imodium instruction. Prescription sent to pharmacy. Proceed with 1 L of IV fluid today.  Severe protein calorie malnutrition.weight loss Refer to follow-up with nutritionist.   Continue nutrition supplementation.  Glucerna. He has gained  weight.  #Hypotension, poor oral intake, patient will receive 1 L of IV fluid today.  Encourage oral hydration. I will check cortisol level.  Follow-up in 1 week. All questions were answered. The patient knows to call the clinic with any problems questions or concerns.  Earlie Server, MD, PhD Hematology Oncology [facility] [place]- [phone] 08/27/2020

## 2020-08-28 ENCOUNTER — Telehealth: Payer: Self-pay | Admitting: *Deleted

## 2020-08-28 ENCOUNTER — Telehealth: Payer: Self-pay

## 2020-08-28 ENCOUNTER — Other Ambulatory Visit: Payer: Medicare Other | Admitting: Primary Care

## 2020-08-28 DIAGNOSIS — Z515 Encounter for palliative care: Secondary | ICD-10-CM | POA: Diagnosis not present

## 2020-08-28 DIAGNOSIS — Z794 Long term (current) use of insulin: Secondary | ICD-10-CM

## 2020-08-28 DIAGNOSIS — E119 Type 2 diabetes mellitus without complications: Secondary | ICD-10-CM | POA: Diagnosis not present

## 2020-08-28 DIAGNOSIS — C3490 Malignant neoplasm of unspecified part of unspecified bronchus or lung: Secondary | ICD-10-CM | POA: Diagnosis not present

## 2020-08-28 DIAGNOSIS — J42 Unspecified chronic bronchitis: Secondary | ICD-10-CM

## 2020-08-28 MED FILL — GILOTRIF 30 MG TABLET: 30 | 30 days supply | Qty: 30 | Fill #0

## 2020-08-28 NOTE — Telephone Encounter (Signed)
Sure, no problem.   Ellison Hughs - can you schedule pt for lab/md/poss IVF on 11/15? He will need to ride the [place] and prefers afternoon appts.

## 2020-08-28 NOTE — Telephone Encounter (Signed)
Pt's daughter understood from visit yesterday that Dr. Tasia Catchings would like to see pt weekly for poss IVF. Pt is currently scheduled for 11/8 but daughter is asking if he can be scheduled for 11/15 in addition to his 11/8 appt.   Pt prefers afternoon appts.   Please advise.

## 2020-08-28 NOTE — Telephone Encounter (Signed)
PA request received from pharmacy for the Magic mouthwash rx.  The PA has been submitted via [email or web address] with key #[id number]

## 2020-08-28 NOTE — Telephone Encounter (Signed)
Dr. Tasia Catchings, patient's daughter wants to get the 2 week possible IVF scheduled.  Do you want to schedule him for lab/MD/poss IVF in 2 weeks?

## 2020-08-28 NOTE — Telephone Encounter (Signed)
Colin [place]ak, we received PA request for the Magic Mouthwash and I have submitted.

## 2020-08-28 NOTE — Progress Notes (Signed)
[facility] Palliative Care Consult Note Telephone: [phone]  Fax: [phone]  PATIENT NAME: Colin Novak [address] [place] [address] [phone] (home)  DOB: April 09, 1946 MRN: [id number]  PRIMARY CARE PROVIDER:    Guadalupe Maple, MD,  No address on file None   Colin Novak, Colin Boys, NP Colin Novak,  Colin Novak [address]   REFERRING PROVIDER:   [place], NP Colin Novak,  [place] [address]   RESPONSIBLE PARTY:   Extended Emergency Contact Information Primary Emergency Contact: Colin Novak Mobile Phone: [phone] Relation: Daughter Secondary Emergency Contact: Colin Novak Mobile Phone: [phone] Relation: Sister Interpreter needed? No  I met face to face with patient and family in  Home.   ASSESSMENT AND RECOMMENDATIONS:   1. Advance Care Planning/Goals of Care: Goals include to maximize quality of life and symptom management. Our advance care planning conversation included a discussion about:     The value and importance of advance care planning   Exploration of personal, cultural or spiritual beliefs that might influence medical decisions   Exploration of goals of care in the event of a sudden injury or illness - States he knows his life is limited  Identification and preparation of a healthcare agent - Daughter.  Discussed goals of care and treatment for lung cancer.  Discussion RE to  de-escalate disease focused treatments due to poor prognosis. He is currently trying an oral chemo but has side effects that are uncomfortable. Will f/u with oncology to manage.   2. Symptom Management:   Cancer management: Taking afatinib 30 mg but with oral blistering. Dr Tasia Catchings has sent in magic mouthwash and has chloroseptic otc.THey will refill. He is not sure of the benefits so I asked him to discuss with oncology team on next visit.  Nutrition: Taking some supplements. BG is being monitored and has  been labile. On 2 steroid appetite stimulants. May benefit from mirtazapine for mood and intake.  T/c to endocrine to clarify course of meds.  Glucose: Has been labile from 90's to 350s. On megace and fludrocortisone. Doing mealtime dosing as well.   3. Follow up Palliative Care Visit: Palliative care will continue to follow for goals of care clarification and symptom management. Return 2 weeks or prn.  4. Family /Caregiver/Community Supports: D[place] is support[place] Getting home supportive services but could benefit from weekly RN visits for medication management from home health.  5. Cognitive / Functional decline:   I spent 75 minutes providing this consultation,  from 1030 to 1145. More than 50% of the time in this consultation was spent coordinating communication.   CHIEF COMPLAINT: anorexia, wt loss  HISTORY OF PRESENT ILLNESS:  Colin Novak is a 74 y.o. year old male  with terminal cancer, cachexia . We are asked to consult around sx management and goals of care.     Review or lab tests, radiology,  or medicine - no recent EKG noted in [facility]. Review of case with family member -Daughter joined on call and gave recent clinical history.  Palliative Care was asked to follow this patient by consultation request of Colin Novak, Josh NP to help address advance care planning and goals of care. This is the initial visit.  CODE STATUS: DNR in home  PPS: 50%  HOSPICE ELIGIBILITY/DIAGNOSIS: yes with concordant goals of care  ROS  General: NAD, cachectic EYES: denies vision changes, wears glasses ENMT: denies dysphagia Cardiovascular: denies  chest pain Pulmonary: endorses production cough, white sputum, currently smoking, some  increased SOB Abdomen: endorses fair appetite, endorses diarrhea,  endorses continence of bowel GU: denies dysuria, endorses continence of urine MSK:  endorses some ROM limitations, no Novak reported Skin: denies rashes or  wounds Neurological: endorses weakness, endorses arthritic pain, endorses insomnia due to anxiety.  Psych: Endorses anxiety moods at times   Physical Exam: Current and past weights: 104 lbs Constitutional: nAD General :frail appearing, thin, cachectic EYES: anicteric sclera,EOMI, lids intact, no discharge  ENMT: intact hearing,oral mucous membranes moist, poor dentition CV:  no LE edema Pulmonary: slight increased work of breathing, + cough, no audible wheezes, room air Abdomen: intake 50%,no ascites GU: deferred MSK: severe sacropenia, decreased ROM in all extremities, no contractures of LE,  ambulatory Skin: warm and dry, no rashes or wounds on visible skin Neuro: + Weakness, no cognitive impairment, grossly non -focal Psych: non -anxious affect, A and O x 3   CURRENT PROBLEM LIST:  Patient Active Problem List   Diagnosis Date Noted  . Encounter for antineoplastic chemotherapy 08/09/2020  . 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 Hypotension due to hypovolemia 07/26/2020  . Hyponatremia 07/26/2020  . Type 2 diabetes mellitus without complication, with long-term current use of insulin ([place]) 02/03/2020  . Malignant neoplasm of overlapping sites of lung ([place]) 01/16/2020  . Goals of care, counseling/discussion 06/20/2019  . Protein-calorie malnutrition ([place]) 06/20/2019  . Malignant neoplasm of lung ([place]) 06/20/2019  . Moderate protein-calorie malnutrition ([place]) 05/24/2018  . Tobacco dependence 05/24/2018  . Underweight 05/24/2018  . Weight loss, unintentional 05/24/2018  . Edema extremities 11/19/2017  . Depression, major, single episode, moderate ([place]) 11/19/2017  . Advanced care planning/counseling discussion 11/19/2017  . Senile purpura ([place]) 04/01/2017  . COPD (chronic obstructive pulmonary disease) ([place]) 07/24/2015  . Malnutrition ([place]) 07/24/2015  . Skin lesion 07/24/2015  . BPH (benign prostatic hyperplasia) 07/24/2015  . Diabetes mellitus associated with  hormonal etiology ([place]) 04/11/2015  . Hyperlipidemia 04/11/2015   PAST MEDICAL HISTORY:  Active Ambulatory Problems    Diagnosis Date Noted  . Diabetes mellitus associated with hormonal etiology ([place]) 04/11/2015  . Hyperlipidemia 04/11/2015  . COPD (chronic obstructive pulmonary disease) ([place]) 07/24/2015  . Malnutrition ([place]) 07/24/2015  . Skin lesion 07/24/2015  . BPH (benign prostatic hyperplasia) 07/24/2015  . Senile purpura ([place]) 04/01/2017  . Edema extremities 11/19/2017  . Depression, major, single episode, moderate ([place]) 11/19/2017  . Advanced care planning/counseling discussion 11/19/2017  . Moderate protein-calorie malnutrition ([place]) 05/24/2018  . Tobacco dependence 05/24/2018  . Underweight 05/24/2018  . Weight loss, unintentional 05/24/2018  . Goals of care, counseling/discussion 06/20/2019  . Protein-calorie malnutrition ([place]) 06/20/2019  . Malignant neoplasm of lung ([place]) 06/20/2019  . Malignant neoplasm of overlapping sites of lung ([place]) 01/16/2020  . Type 2 diabetes mellitus without complication, with long-term current use of insulin ([place]) 02/03/2020  . Hypotension due to hypovolemia 07/26/2020  . Hyponatremia 07/26/2020  . Encounter for antineoplastic chemotherapy 08/09/2020  . NSCLC with EGFR mutation ([place]) 08/09/2020   Resolved Ambulatory Problems    Diagnosis Date Noted  . Uncontrolled type 2 diabetes mellitus with hyperglycemia, with long-term current use of insulin ([place]) 01/29/2017   Past Medical History:  Diagnosis Date  . Anxiety   . Diabetes mellitus without complication ([place])   . GERD (gastroesophageal reflux disease)   ,  Past Medical History:  Diagnosis Date  . Anxiety   . COPD (chronic obstructive pulmonary disease) ([place])   .  Diabetes mellitus without complication ([place])    type 2  . GERD (gastroesophageal reflux disease)   . Hyperlipidemia    SOCIAL HX:  Social History   Tobacco Use  . Smoking status: Current Every Day Smoker     Packs/day: 1.00    Types: Cigarettes  . Smokeless tobacco: Former Systems developer    Types: Chew    Quit date: 06/10/1959  . Tobacco comment: 1 pack daily- 06/09/2019  Substance Use Topics  . Alcohol use: No   FAMILY HX:  Family History  Problem Relation Age of Onset  . Cancer Mother        lung (non smoker)  . Diabetes Father   . Hypertension Father   . Lymphoma Brother   . Prostate cancer Brother     ALLERGIES:  Allergies  Allergen Reactions  . Penicillins Rash     PERTINENT MEDICATIONS:  Outpatient Encounter Medications as of 08/28/2020  Medication Sig  . afatinib dimaleate (GILOTRIF) 30 MG tablet Take 1 tablet (30 mg total) by mouth daily. Take on an empty stomach 1hr before or 2hrs after meals.  Marland Kitchen albuterol (PROAIR HFA) 108 (90 Base) MCG/ACT inhaler Inhale 1-2 puffs into the lungs every 6 (six) hours as needed for wheezing or shortness of breath (use as needed for chest congestion.).  Marland Kitchen aspirin EC 81 MG tablet Take 81 mg by mouth daily.  . citalopram (CELEXA) 20 MG tablet Take 1 tablet (20 mg total) by mouth daily.  Marland Kitchen Dextromethorphan-guaiFENesin (ROBITUSSIN DM PO) Take 5 mLs by mouth at bedtime.   Marland Kitchen ezetimibe-simvastatin (VYTORIN) 10-40 MG tablet Take 1 tablet by mouth daily.  . fludrocortisone (FLORINEF) 0.1 MG tablet Take 100 mcg by mouth daily.  . Fluticasone-Umeclidin-Vilant (TRELEGY ELLIPTA) 100-62.5-25 MCG/INH AEPB Inhale 1 applicator into the lungs daily. Rinse mouth after use.  Marland Kitchen glipiZIDE (GLUCOTROL XL) 5 MG 24 hr tablet Take by mouth.  Marland Kitchen glucose blood (ONE TOUCH ULTRA TEST) test strip 1 each by Other route 2 (two) times daily as needed for other. Dx: E11.9  . HUMALOG KWIKPEN 100 UNIT/ML KwikPen 10 Units. Sliding scale up to 12 units  . LANTUS SOLOSTAR 100 UNIT/ML Solostar Pen INJECT 10-20 UNITS INTO THE SKIN DAILY  . loperamide (IMODIUM) 2 MG capsule Take 1 capsule (2 mg total) by mouth See admin instructions. Initial: 4 mg, followed by 2 mg after each loose stool; maximum:  16 mg/day  . megestrol (MEGACE) 400 MG/10ML suspension Take 10 mLs (400 mg total) by mouth daily.  . metFORMIN (GLUCOPHAGE) 500 MG tablet TAKE ONE TABLET BY MOUTH TWICE DAILY  . Multiple Vitamin (MULTIVITAMIN) tablet Take 1 tablet by mouth daily.   Marland Kitchen NOVOFINE 32G X 6 MM MISC USE AS DIRECTED. 5 INJECTIONS A DAY  . omeprazole (PRILOSEC) 20 MG capsule Take 1 capsule (20 mg total) by mouth daily. (Patient not taking: Reported on 07/26/2020)  . oxymetazoline (AFRIN) 0.05 % nasal spray Place 2 sprays into both nostrils 2 (two) times daily as needed.  (Patient not taking: Reported on 08/27/2020)  . pioglitazone (ACTOS) 30 MG tablet Take 30 mg by mouth daily.   . pseudoephedrine-acetaminophen (TYLENOL SINUS) 30-500 MG TABS tablet Take 2 tablets by mouth every 4 (four) hours as needed. Only using twice daily (Patient not taking: Reported on 07/31/2020)  . triamcinolone (NASACORT) 55 MCG/ACT AERO nasal inhaler Place 1 spray into the nose 2 (two) times daily. (Patient not taking: Reported on 02/03/2020)  . [DISCONTINUED] prochlorperazine (COMPAZINE) 10 MG tablet Take 1 tablet (  10 mg total) by mouth every 6 (six) hours as needed (Nausea or vomiting). (Patient not taking: Reported on 01/13/2020)   No facility-administered encounter medications on file as of 08/28/2020.     Jason Coop, NP , DNP, MPH, AGPCNP-BC, ACHPN  COVID-19 PATIENT SCREENING TOOL  Person answering questions: ____________self______ _____   1.  Is the patient or any family member in the home showing any signs or symptoms regarding respiratory infection?               Person with Symptom- __________NA_________________  a. Fever                                                                          Yes___ No___          ___________________  b. Shortness of breath                                                    Yes___ No___          ___________________ c. Cough/congestion                                       Yes___  No___          ___________________ d. Body aches/pains                                                         Yes___ No___        ____________________ e. Gastrointestinal symptoms (diarrhea, nausea)           Yes___ No___        ____________________  2. Within the past 14 days, has anyone living in the home had any contact with someone with or under investigation for COVID-19?    Yes___ No_X_   Person __________________

## 2020-08-28 NOTE — Telephone Encounter (Signed)
Sure. Colin Novak, I asked him to update me if mucositis symptoms are getting worse despite magic mouth wash. This can be side effects from afatanib and if symptoms get worse, I can hold treatment and dose reduce. Can you follow up with him later this week? Thanks.

## 2020-08-29 NOTE — Telephone Encounter (Signed)
mix 1/4 teaspoon of baking soda and 1/8 teaspoon of salt in 1 cup of warm water. Swish and spit 4 time a day.  Also please advise him to stop the afatanib for now.

## 2020-08-29 NOTE — Telephone Encounter (Signed)
Dr. Tasia Catchings the insurance authorization for Magic Mouthwash prescription has been denied.  Is there a substitute you would like to send?

## 2020-08-29 NOTE — Telephone Encounter (Signed)
Done.. Pt has been sched as requested And was made aware of his 11/15 appt. Date and time with Lucianne Lei pick-up)

## 2020-08-30 NOTE — Telephone Encounter (Signed)
Left message on Colin Novak voicemail to call the office (patient's daughter and POA)

## 2020-08-30 NOTE — Telephone Encounter (Signed)
Colin Novak is aware and instructions for mouthwash sent to patient's MyChart for her to review.  I also notified patient of MD recommendations.

## 2020-09-03 ENCOUNTER — Inpatient Hospital Stay: Payer: Medicare Other

## 2020-09-03 ENCOUNTER — Other Ambulatory Visit: Payer: Self-pay | Admitting: Pharmacist

## 2020-09-03 ENCOUNTER — Encounter: Payer: Self-pay | Admitting: Oncology

## 2020-09-03 ENCOUNTER — Inpatient Hospital Stay (HOSPITAL_BASED_OUTPATIENT_CLINIC_OR_DEPARTMENT_OTHER): Payer: Medicare Other | Admitting: Hospice and Palliative Medicine

## 2020-09-03 ENCOUNTER — Other Ambulatory Visit: Payer: Self-pay

## 2020-09-03 ENCOUNTER — Inpatient Hospital Stay (HOSPITAL_BASED_OUTPATIENT_CLINIC_OR_DEPARTMENT_OTHER): Payer: Medicare Other | Admitting: Oncology

## 2020-09-03 ENCOUNTER — Other Ambulatory Visit (HOSPITAL_COMMUNITY): Payer: Self-pay | Admitting: Oncology

## 2020-09-03 VITALS — BP 96/64 | HR 88 | Temp 96.7°F | Resp 18 | Wt 113.3 lb

## 2020-09-03 DIAGNOSIS — F17210 Nicotine dependence, cigarettes, uncomplicated: Secondary | ICD-10-CM | POA: Diagnosis not present

## 2020-09-03 DIAGNOSIS — E861 Hypovolemia: Secondary | ICD-10-CM

## 2020-09-03 DIAGNOSIS — Z515 Encounter for palliative care: Secondary | ICD-10-CM

## 2020-09-03 DIAGNOSIS — E46 Unspecified protein-calorie malnutrition: Secondary | ICD-10-CM | POA: Diagnosis not present

## 2020-09-03 DIAGNOSIS — R197 Diarrhea, unspecified: Secondary | ICD-10-CM

## 2020-09-03 DIAGNOSIS — Z923 Personal history of irradiation: Secondary | ICD-10-CM | POA: Diagnosis not present

## 2020-09-03 DIAGNOSIS — C3480 Malignant neoplasm of overlapping sites of unspecified bronchus and lung: Secondary | ICD-10-CM

## 2020-09-03 DIAGNOSIS — F419 Anxiety disorder, unspecified: Secondary | ICD-10-CM | POA: Diagnosis not present

## 2020-09-03 DIAGNOSIS — E119 Type 2 diabetes mellitus without complications: Secondary | ICD-10-CM | POA: Diagnosis not present

## 2020-09-03 DIAGNOSIS — I959 Hypotension, unspecified: Secondary | ICD-10-CM | POA: Diagnosis not present

## 2020-09-03 DIAGNOSIS — Z5111 Encounter for antineoplastic chemotherapy: Secondary | ICD-10-CM

## 2020-09-03 DIAGNOSIS — E876 Hypokalemia: Secondary | ICD-10-CM | POA: Diagnosis not present

## 2020-09-03 DIAGNOSIS — R0602 Shortness of breath: Secondary | ICD-10-CM | POA: Diagnosis not present

## 2020-09-03 DIAGNOSIS — I9589 Other hypotension: Secondary | ICD-10-CM

## 2020-09-03 DIAGNOSIS — R059 Cough, unspecified: Secondary | ICD-10-CM | POA: Diagnosis not present

## 2020-09-03 DIAGNOSIS — R634 Abnormal weight loss: Secondary | ICD-10-CM

## 2020-09-03 DIAGNOSIS — Z9221 Personal history of antineoplastic chemotherapy: Secondary | ICD-10-CM | POA: Diagnosis not present

## 2020-09-03 DIAGNOSIS — J449 Chronic obstructive pulmonary disease, unspecified: Secondary | ICD-10-CM | POA: Diagnosis not present

## 2020-09-03 DIAGNOSIS — C7800 Secondary malignant neoplasm of unspecified lung: Secondary | ICD-10-CM | POA: Diagnosis not present

## 2020-09-03 DIAGNOSIS — Z79899 Other long term (current) drug therapy: Secondary | ICD-10-CM | POA: Diagnosis not present

## 2020-09-03 DIAGNOSIS — K1230 Oral mucositis (ulcerative), unspecified: Secondary | ICD-10-CM

## 2020-09-03 DIAGNOSIS — Z66 Do not resuscitate: Secondary | ICD-10-CM | POA: Diagnosis not present

## 2020-09-03 LAB — COMPREHENSIVE METABOLIC PANEL
ALT: 9 U/L (ref 0–44)
AST: 17 U/L (ref 15–41)
Albumin: 2.9 g/dL — ABNORMAL LOW (ref 3.5–5.0)
Alkaline Phosphatase: 69 U/L (ref 38–126)
Anion gap: 5 (ref 5–15)
BUN: 13 mg/dL (ref 8–23)
CO2: 29 mmol/L (ref 22–32)
Calcium: 8.4 mg/dL — ABNORMAL LOW (ref 8.9–10.3)
Chloride: 103 mmol/L (ref 98–111)
Creatinine, Ser: 0.44 mg/dL — ABNORMAL LOW (ref 0.61–1.24)
GFR, Estimated: >60 mL/min (ref >60)
Glucose, Bld: 252 mg/dL — ABNORMAL HIGH (ref 70–99)
Potassium: 3.6 mmol/L (ref 3.5–5.1)
Sodium: 137 mmol/L (ref 135–145)
Total Bilirubin: 0.4 mg/dL (ref 0.3–1.2)
Total Protein: 5.8 g/dL — ABNORMAL LOW (ref 6.5–8.1)

## 2020-09-03 LAB — CBC WITH DIFFERENTIAL/PLATELET
Abs Immature Granulocytes: 0.02 10*3/uL (ref 0.00–0.07)
Basophils Absolute: 0 10*3/uL (ref 0.0–0.1)
Basophils Relative: 1 %
Eosinophils Absolute: 0 10*3/uL (ref 0.0–0.5)
Eosinophils Relative: 0 %
HCT: 35.1 % — ABNORMAL LOW (ref 39.0–52.0)
Hemoglobin: 12.2 g/dL — ABNORMAL LOW (ref 13.0–17.0)
Immature Granulocytes: 0 %
Lymphocytes Relative: 12 %
Lymphs Abs: 0.7 10*3/uL (ref 0.7–4.0)
MCH: 34.1 pg — ABNORMAL HIGH (ref 26.0–34.0)
MCHC: 34.8 g/dL (ref 30.0–36.0)
MCV: 98 fL (ref 80.0–100.0)
Monocytes Absolute: 0.6 10*3/uL (ref 0.1–1.0)
Monocytes Relative: 10 %
Neutro Abs: 4.3 10*3/uL (ref 1.7–7.7)
Neutrophils Relative %: 77 %
Platelets: 193 10*3/uL (ref 150–400)
RBC: 3.58 MIL/uL — ABNORMAL LOW (ref 4.22–5.81)
RDW: 13 % (ref 11.5–15.5)
WBC: 5.6 10*3/uL (ref 4.0–10.5)
nRBC: 0 % (ref 0.0–0.2)

## 2020-09-03 LAB — CORTISOL: Cortisol, Plasma: 6.5 ug/dL

## 2020-09-03 MED ORDER — AFATINIB DIMALEATE 20 MG PO TABS: 20.0000 mg | ORAL_TABLET | Freq: Every day | ORAL | 2 refills | Status: DC

## 2020-09-03 MED ORDER — SODIUM CHLORIDE 0.9 % IV SOLN
Freq: Once | INTRAVENOUS | Status: AC
  Filled 2020-09-03: qty 250

## 2020-09-03 MED ORDER — LIDOCAINE VISCOUS HCL 2 % MT SOLN: 15.0000 mL | OROMUCOSAL | 0 refills | Status: DC | PRN

## 2020-09-03 NOTE — Progress Notes (Signed)
Hematology/Oncology follow up note [facility] Telephone:([phone] Fax:([phone]) [phone]   Patient Care Team: Colin Maple, MD as PCP - General (Family Medicine) Novak, Colin Holiday, MD as Physician Assistant (Endocrinology) Kem Parkinson, MD (Ophthalmology) Telford Nab, RN as Registered Nurse Colin Novak, Colin Eva, NP as Nurse Practitioner Colin Server, MD as Consulting Physician (Oncology) Borders, Colin Boys, NP as Referring Physician (Hospice and Palliative Medicine)  REFERRING PROVIDER: Guadalupe Maple, MD  CHIEF COMPLAINTS/REASON FOR VISIT:  Follow up for non-small cell lung cancer  HISTORY OF PRESENTING ILLNESS:  Colin Novak is a  74 y.o.  male with PMH listed below was seen in consultation at the request of  Colin Maple, MD  for evaluation of lung mass Patient reports ongoing shortness of breath, cough for  few months. He had CT chest scan done on 06/01/2019 which showed a concerning feature of 9.1 x 5.1 x 3.6 cm right upper lobe mass extending into mediastinum, encasing and occluding right upper lobe bronchus and some of the right upper lobe bronchial branches.  There is also significant narrowing of FVC. He also had a similar appearance of left upper lobe mass without mediastinal extension suspicious for a synchronized primary lung cancer Multiple additional smaller bilateral lung nodules, left greater than right. Mildly enlarged right hilar and mediastinal lymph nodes suspicious for metastatic lymphadenopathy. 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 Daughter Colin Novak is his power of attorney. Denies fever or chills.  # patient was seen by me on 06/20/2019.  At that time, patient has 2 separate lung tumor.  Possibility of stage IV lung adenocarcinoma TX N1 M1 discussed with patient given that he has tumor in the contralateral lobe.  Systemic  chemotherapy with immunotherapy was recommended.  Patient declined at that time. He may have 2 separate primary lung cancer, if that is the case, he has stage III lung cancer.  would benefit from definitive concurrent chemoradiation followed by immunotherapy for maintenance. Patient has decided at that time that he does not want any systemic chemotherapy.   08/05/2019 He finished radiation to right upper lobe lesion.  09/16/2019 s/p radiation boost  01/06/2020 CT scan showed interval increase of the right para mediastinal and hilar mass and also multiple areas of increased density in the left lower lobe consistent with multifocal adenocarcinoma/metastasis.  #01/23/2020, PET scan showed a decreased left upper lobe and right upper lobe lesion. There is still occlusion of the right upper lobe bronchus with some associated atelectasis.Enlarged left lung nodules have not had increased metabolic activity and remained well below blood pool.New sclerosis in the upper sternal body comparing to previous PET scan. -Patient was not interested in any systemic treatment.  Declined immunotherapy maintenance.  NGS was sent.  he follows up with palliative care service.  # 08/02/2020 PET scan images were reviewed  No recurrent hypermetabolism at the site of masslike fibrosis in paramediastinal right upper lobe  Mildly hypermetabolic left upper lobe 4.7QQ mass, not changed since last PET 6 months ago,  Numerous (>10) irregular pulmonary nodules scattered through out bilateral lungs,increased or new from last study. Non has significant FDG uptake, compatible with progression of metastatic disease. No recurrent hypermetabolism at the site of masslike fibrosis in the right upper lobe. No recurrent hypermetabolic thoracic Adenopathy. No hypermetabolic metastatic disease in the abdomen, pelvis or Skeleton  INTERVAL HISTORY Colin Novak is a 74 y.o. male who has  above history reviewed by me today presents for follow up visit  for management of  non-small cell lung cancer. Problems and complaints are listed below: He has been started on Afatinib since 07/31/2020.  Patient has developed mucositis which affected his eating and drinking.  I advised patient to stop afatinib on 08/31/2020 Magic mouthwash was denied by patient's insurance.  Patient was advised to use sodium bicarb mixed with water swish and spit. Patient reports that since the discontinuation of afatinib, his mouth pain has improved. Appetite is fair and he has gained weight.   Review of Systems  Constitutional: Positive for fatigue. Negative for appetite change, chills, diaphoresis, fever and unexpected weight change.  HENT:   Negative for hearing loss, lump/mass, nosebleeds, sore throat and voice change.        Mouth pain has improved.  Eyes: Negative for eye problems and icterus.  Respiratory: Negative for chest tightness, cough, hemoptysis, shortness of breath and wheezing.   Cardiovascular: Negative for chest pain and leg swelling.  Gastrointestinal: Negative for abdominal distention, abdominal pain, blood in stool, diarrhea, nausea and rectal pain.  Endocrine: Negative for hot flashes.  Genitourinary: Negative for bladder incontinence, difficulty urinating, dysuria, frequency, hematuria and nocturia.   Musculoskeletal: Positive for back pain. Negative for arthralgias, flank pain, gait problem and myalgias.  Skin: Negative for itching and rash.  Neurological: Negative for dizziness, gait problem, headaches, light-headedness, numbness and seizures.  Hematological: Negative for adenopathy. Does not bruise/bleed easily.  Psychiatric/Behavioral: Negative for confusion and decreased concentration. The patient is not nervous/anxious.     MEDICAL HISTORY:  Past Medical History:  Diagnosis Date  . Anxiety   . COPD (chronic obstructive pulmonary disease) ([place])   . Diabetes mellitus without complication ([place])    type 2  . GERD (gastroesophageal reflux  disease)   . Hyperlipidemia     SURGICAL HISTORY: Past Surgical History:  Procedure Laterality Date  . APPENDECTOMY    . ENDOBRONCHIAL ULTRASOUND Bilateral 06/13/2019   Procedure: ENDOBRONCHIAL ULTRASOUND, BILATERAL;  Surgeon: [name], MD;  Location: [facility] ORS;  Service: Pulmonary;  Laterality: Bilateral;  . EYE SURGERY Bilateral    cataract    SOCIAL HISTORY: Social History   Socioeconomic History  . Marital status: Widowed    Spouse name: Not on file  . Number of children: 1  . Years of education: Not on file  . Highest education level: High school graduate  Occupational History  . Occupation: retired  Tobacco Use  . Smoking status: Current Every Day Smoker    Packs/day: 1.00    Types: Cigarettes  . Smokeless tobacco: Former Systems developer    Types: Chew    Quit date: 06/10/1959  . Tobacco comment: 1 pack daily- 06/09/2019  Vaping Use  . Vaping Use: Never used  Substance and Sexual Activity  . Alcohol use: No  . Drug use: No  . Sexual activity: Not Currently  Other Topics Concern  . Not on file  Social History Narrative  . Not on file   Social Determinants of Health   Financial Resource Strain:   . Difficulty of Paying Living Expenses: Not on file  Food Insecurity:   . Worried About Charity fundraiser in the Last Year: Not on file  . Ran Out of Food in the Last Year: Not on file  Transportation Needs:   . Lack of Transportation (Medical): Not on file  . Lack of Transportation (Non-Medical): Not on file  Physical Activity:   . Days of  Exercise per Week: Not on file  . Minutes of Exercise per Session: Not on file  Stress:   . Feeling of Stress : Not on file  Social Connections:   . Frequency of Communication with Friends and Family: Not on file  . Frequency of Social Gatherings with Friends and Family: Not on file  . Attends Religious Services: Not on file  . Active Member of Clubs or Organizations: Not on file  . Attends Archivist Meetings:  Not on file  . Marital Status: Not on file  Intimate Partner Violence:   . Fear of Current or Ex-Partner: Not on file  . Emotionally Abused: Not on file  . Physically Abused: Not on file  . Sexually Abused: Not on file    FAMILY HISTORY: Family History  Problem Relation Age of Onset  . Cancer Mother        lung (non smoker)  . Diabetes Father   . Hypertension Father   . Lymphoma Brother   . Prostate cancer Brother     ALLERGIES:  is allergic to penicillins.  MEDICATIONS:  Current Outpatient Medications  Medication Sig Dispense Refill  . albuterol (PROAIR HFA) 108 (90 Base) MCG/ACT inhaler Inhale 1-2 puffs into the lungs every 6 (six) hours as needed for wheezing or shortness of breath (use as needed for chest congestion.). 1 g 3  . aspirin EC 81 MG tablet Take 81 mg by mouth daily.    . citalopram (CELEXA) 20 MG tablet Take 1 tablet (20 mg total) by mouth daily. 90 tablet 4  . Dextromethorphan-guaiFENesin (ROBITUSSIN DM PO) Take 5 mLs by mouth at bedtime.     Marland Kitchen ezetimibe-simvastatin (VYTORIN) 10-40 MG tablet Take 1 tablet by mouth daily. 90 tablet 4  . fludrocortisone (FLORINEF) 0.1 MG tablet Take 100 mcg by mouth daily.    Marland Kitchen glipiZIDE (GLUCOTROL XL) 5 MG 24 hr tablet Take by mouth.    Marland Kitchen glucose blood (ONE TOUCH ULTRA TEST) test strip 1 each by Other route 2 (two) times daily as needed for other. Dx: E11.9 100 each 12  . HUMALOG KWIKPEN 100 UNIT/ML KwikPen 10 Units. Sliding scale up to 12 units    . LANTUS SOLOSTAR 100 UNIT/ML Solostar Pen INJECT 10-20 UNITS INTO THE SKIN DAILY 5 pen 12  . loperamide (IMODIUM) 2 MG capsule Take 1 capsule (2 mg total) by mouth See admin instructions. Initial: 4 mg, followed by 2 mg after each loose stool; maximum: 16 mg/day 90 capsule 0  . megestrol (MEGACE) 400 MG/10ML suspension Take 10 mLs (400 mg total) by mouth daily. 240 mL 0  . metFORMIN (GLUCOPHAGE) 500 MG tablet TAKE ONE TABLET BY MOUTH TWICE DAILY 60 tablet 12  . Multiple Vitamin  (MULTIVITAMIN) tablet Take 1 tablet by mouth daily.     Marland Kitchen NOVOFINE 32G X 6 MM MISC USE AS DIRECTED. 5 INJECTIONS A DAY 900 each 0  . oxymetazoline (AFRIN) 0.05 % nasal spray Place 2 sprays into both nostrils 2 (two) times daily as needed.     . pioglitazone (ACTOS) 30 MG tablet Take 30 mg by mouth daily.   1  . afatinib dimaleate (GILOTRIF) 20 MG tablet Take 1 tablet (20 mg total) by mouth daily. Take on an empty stomach 1hr before or 2hrs after meals. 30 tablet 2  . Fluticasone-Umeclidin-Vilant (TRELEGY ELLIPTA) 100-62.5-25 MCG/INH AEPB Inhale 1 applicator into the lungs daily. Rinse mouth after use. (Patient not taking: Reported on 08/28/2020) 1 each 10  . omeprazole (  PRILOSEC) 20 MG capsule Take 1 capsule (20 mg total) by mouth daily. (Patient not taking: Reported on 07/26/2020) 30 capsule 11  . pseudoephedrine-acetaminophen (TYLENOL SINUS) 30-500 MG TABS tablet Take 2 tablets by mouth every 4 (four) hours as needed. Only using twice daily (Patient not taking: Reported on 07/31/2020)    . triamcinolone (NASACORT) 55 MCG/ACT AERO nasal inhaler Place 1 spray into the nose 2 (two) times daily. (Patient not taking: Reported on 02/03/2020) 1 Inhaler 2   No current facility-administered medications for this visit.     PHYSICAL EXAMINATION: ECOG PERFORMANCE STATUS: 2 - Symptomatic, <50% confined to bed Vitals:   09/03/20 1331  BP: 96/64  Pulse: 88  Resp: 18  Temp: (!) 96.7 F (35.9 C)  SpO2: 96%   Filed Weights   09/03/20 1331  Weight: 113 lb 4.8 oz (51.4 kg)    Physical Exam Constitutional:      General: He is not in acute distress.    Appearance: He is ill-appearing.     Comments: Cachectic  HENT:     Head: Normocephalic and atraumatic.  Eyes:     General: No scleral icterus.    Pupils: Pupils are equal, round, and reactive to light.  Neck:     Comments: No neck swelling Cardiovascular:     Rate and Rhythm: Normal rate and regular rhythm.     Heart sounds: Normal heart sounds.    Pulmonary:     Effort: Pulmonary effort is normal. No respiratory distress.     Breath sounds: No wheezing.     Comments: Decreased breath sounds bilaterally.  Abdominal:     General: Bowel sounds are normal. There is no distension.     Palpations: Abdomen is soft. There is no mass.  Musculoskeletal:        General: No deformity. Normal range of motion.     Cervical back: Normal range of motion and neck supple.     Comments: Clubbing  Skin:    General: Skin is warm and dry.     Findings: No erythema or rash.  Neurological:     General: No focal deficit present.     Mental Status: He is alert and oriented to person, place, and time. Mental status is at baseline.     Cranial Nerves: No cranial nerve deficit.     Coordination: Coordination normal.  Psychiatric:        Mood and Affect: Mood normal.     LABORATORY DATA:  I have reviewed the data as listed Lab Results  Component Value Date   WBC 5.6 09/03/2020   HGB 12.2 (L) 09/03/2020   HCT 35.1 (L) 09/03/2020   MCV 98.0 09/03/2020   PLT 193 09/03/2020   Recent Labs    01/16/20 0942 07/24/20 1104 07/26/20 1001 07/31/20 1059 08/09/20 0854 08/27/20 1324 09/03/20 1302  NA 132*  --  130*   < > 135 136 137  K 4.9  --  4.4   < > 3.9 4.1 3.6  CL 98  --  92*   < > 101 101 103  CO2 24  --  26   < > $R'24 27 29  'uP$ GLUCOSE 460*  --  395*   < > 318* 317* 252*  BUN 13  --  15   < > $R'17 12 13  'YI$ CREATININE 0.80   < > 0.77   < > 0.63 0.75 0.44*  CALCIUM 9.3  --  9.0   < > 9.1 8.7*  8.4*  GFRNONAA >60  --  >60   < > >60 >60 >60  GFRAA >60  --  >60  --   --   --   --   PROT 7.7  --  7.1  --  7.1 5.7* 5.8*  ALBUMIN 4.4  --  4.0  --  4.0 3.0* 2.9*  AST 30  --  15  --  32 19 17  ALT 33  --  12  --  37 13 9  ALKPHOS 110  --  93  --  85 73 69  BILITOT 0.9  --  1.0  --  0.7 0.3 0.4   < > = values in this interval not displayed.   Iron/TIBC/Ferritin/ %Sat No results found for: IRON, TIBC, FERRITIN, IRONPCTSAT    RADIOGRAPHIC STUDIES: I  have personally reviewed the radiological images as listed and agreed with the findings in the report.  MR Brain W Wo Contrast  Result Date: 08/13/2020 CLINICAL DATA:  Lung cancer follow-up EXAM: MRI HEAD WITHOUT AND WITH CONTRAST TECHNIQUE: Multiplanar, multiecho pulse sequences of the brain and surrounding structures were obtained without and with intravenous contrast. CONTRAST:  38mL GADAVIST GADOBUTROL 1 MMOL/ML IV SOLN COMPARISON:  March 2021 FINDINGS: Brain: There is a new punctate focus of enhancement along the posterior left frontal lobe involving cortex of the left superior frontal gyrus (series 13, image 134). There is no acute infarction or intracranial hemorrhage. There is no hydrocephalus. 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 Some of these areas demonstrate associated susceptibility likely reflecting chronic blood products. Other areas of patchy T2 hyperintensity in the supratentorial white matter likely reflects stable chronic microvascular ischemic changes. Vascular: Major vessel flow voids at the skull base are preserved. Skull and upper cervical spine: Normal marrow signal is preserved. Sinuses/Orbits: Paranasal sinuses are aerated. Orbits are unremarkable. Other: Sella is unremarkable.  Mastoid air cells are clear. IMPRESSION: New punctate focus of enhancement in the posterior left frontal lobe. This could reflect a small metastatic lesion and attention on follow-up is recommended. Stable chronic findings detailed above. Electronically Signed   By: Macy Mis M.D.   On: 08/13/2020 16:44      ASSESSMENT & PLAN:  1. Protein-calorie malnutrition, unspecified severity ([place])   2. Malignant neoplasm of overlapping sites of lung, unspecified laterality ([place])   3.  Encounter for antineoplastic chemotherapy   4. Hypotension due to hypovolemia   5. 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  NGS EGFR G719S+, [id number], PD-L1 30% Labs reviewed and discussed with patient. Decrease afatinib to 20 mg daily.   #Mucositis, improved after discontinuation of afatinib temporarily. Advised patient to utilize viscous lidocaine if he experiences mouth pain again after resumed on afatinib 20 mg daily  #Diarrhea, stable. .  Patient will proceed with 1 L of IV fluid today. #Weight loss, patient has gained weight.  Continue nutrition supplementation  #Hypotension, check cortisol level.  IV normal saline 1 L x 1..  Follow-up in 1 week. All questions were answered. The patient knows to call the clinic with any problems questions or concerns.  Colin Server, MD, PhD Hematology Oncology [facility] Pager- [phone] 09/03/2020

## 2020-09-03 NOTE — Progress Notes (Signed)
Oral Oncology Pharmacist Encounter  Prescription for Gilotrif redirected to [facility].  Leron Croak, PharmD, BCPS Hematology/Oncology Clinical Pharmacist [facility] [phone] 09/03/2020 2:39 PM

## 2020-09-03 NOTE — Progress Notes (Signed)
[place]  [phone] [phone]   Name: Colin Novak Date: 09/03/2020 MRN: [id number]  DOB: 1946-08-28  Patient Care Team: Guadalupe Maple, MD as PCP - General (Family Medicine) Solum, Betsey Holiday, MD as Physician Assistant (Endocrinology) Kem Parkinson, MD (Ophthalmology) Telford Nab, RN as Registered Nurse Tamala Julian, Jonette Eva, NP as Nurse Practitioner Earlie Server, MD as Consulting Physician (Oncology) Humbert Morozov, Kirt Boys, NP as Referring Physician (Hospice and Palliative Medicine)    REASON FOR CONSULTATION: Palliative Care consult requested for this 74 y.o. male with multiple medical problems including stage IV non-small cell lung cancer who initially refused chemotherapy/immunotherapy but received palliative XRT to the right upper lobe as mass is obstructing his bronchus.    PET scan on 08/02/2020 revealed disease progression in the lungs.  Patient was started on afatinib.  Patient was referred to palliative care to help address goals and manage ongoing symptoms.  SOCIAL HISTORY:     reports that he has been smoking cigarettes. He has been smoking about 1.00 pack per day. He quit smokeless tobacco use about 61 years ago.  His smokeless tobacco use included chew. He reports that he does not drink alcohol and does not use drugs.   Patient is a widower.  He lives at home alone with his dog.  He has a daughter who is involved in his care.  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  ADVANCE DIRECTIVES:  Not on file  CODE STATUS: DNR (DNR form signed on 09/01/2019 and again on 08/09/2020)  PAST MEDICAL HISTORY: Past Medical History:  Diagnosis Date   Anxiety    COPD (chronic obstructive pulmonary disease) ([place])    Diabetes mellitus without complication ([place])    type 2   GERD (gastroesophageal reflux disease)    Hyperlipidemia     PAST SURGICAL HISTORY:  Past Surgical History:    Procedure Laterality Date   APPENDECTOMY     ENDOBRONCHIAL ULTRASOUND Bilateral 06/13/2019   Procedure: ENDOBRONCHIAL ULTRASOUND, BILATERAL;  Surgeon: [name], MD;  Location: [facility] ORS;  Service: Pulmonary;  Laterality: Bilateral;   EYE SURGERY Bilateral    cataract    HEMATOLOGY/ONCOLOGY HISTORY:  Oncology History  Malignant neoplasm of lung ([place])  06/20/2019 Initial Diagnosis   Non-small cell lung cancer ([place])   01/16/2020 - 01/16/2020 Chemotherapy   The patient had dexamethasone (DECADRON) 4 MG tablet, 0 of 1 cycle, Start date: --, End date: -- palonosetron (ALOXI) injection 0.25 mg, 0.25 mg, Intravenous,  Once, 0 of 4 cycles PEMEtrexed (ALIMTA) 800 mg in sodium chloride 0.9 % 100 mL chemo infusion, 500 mg/m2, Intravenous,  Once, 0 of 4 cycles CARBOplatin (PARAPLATIN) in sodium chloride 0.9 % 100 mL chemo infusion, , Intravenous,  Once, 0 of 4 cycles fosaprepitant (EMEND) 150 mg in sodium chloride 0.9 % 145 mL IVPB, 150 mg, Intravenous,  Once, 0 of 4 cycles pembrolizumab (KEYTRUDA) 200 mg in sodium chloride 0.9 % 50 mL chemo infusion, 200 mg (original dose ), Intravenous, Once, 0 of 4 cycles Dose modification: 200 mg (Cycle 1, Reason: Other (see comments))  for chemotherapy treatment.    01/23/2020 - 01/23/2020 Chemotherapy   The patient had dexamethasone (DECADRON) 4 MG tablet, 1 of 1 cycle, Start date: 06/20/2019, End date: 01/16/2020 palonosetron (ALOXI) injection 0.25 mg, 0.25 mg, Intravenous,  Once, 0 of 4 cycles PEMEtrexed (ALIMTA) 775 mg in sodium chloride 0.9 % 100 mL chemo infusion, 500 mg/m2, Intravenous,  Once, 0 of 4 cycles CARBOplatin (PARAPLATIN) in sodium chloride 0.9 % 100 mL chemo infusion, , Intravenous,  Once, 0 of 4 cycles fosaprepitant (EMEND) 150 mg, dexamethasone (DECADRON) 12 mg in sodium chloride 0.9 % 145 mL IVPB, , Intravenous,  Once, 0 of 4 cycles  for chemotherapy treatment.    07/26/2020 -  Chemotherapy   The patient had [No matching  medication found in this treatment plan]  for chemotherapy treatment.      ALLERGIES:  is allergic to penicillins.  MEDICATIONS:  Current Outpatient Medications  Medication Sig Dispense Refill   afatinib dimaleate (GILOTRIF) 20 MG tablet Take 1 tablet (20 mg total) by mouth daily. Take on an empty stomach 1hr before or 2hrs after meals. 30 tablet 2   albuterol (PROAIR HFA) 108 (90 Base) MCG/ACT inhaler Inhale 1-2 puffs into the lungs every 6 (six) hours as needed for wheezing or shortness of breath (use as needed for chest congestion.). 1 g 3   aspirin EC 81 MG tablet Take 81 mg by mouth daily.     citalopram (CELEXA) 20 MG tablet Take 1 tablet (20 mg total) by mouth daily. 90 tablet 4   Dextromethorphan-guaiFENesin (ROBITUSSIN DM PO) Take 5 mLs by mouth at bedtime.      ezetimibe-simvastatin (VYTORIN) 10-40 MG tablet Take 1 tablet by mouth daily. 90 tablet 4   fludrocortisone (FLORINEF) 0.1 MG tablet Take 100 mcg by mouth daily.     Fluticasone-Umeclidin-Vilant (TRELEGY ELLIPTA) 100-62.5-25 MCG/INH AEPB Inhale 1 applicator into the lungs daily. Rinse mouth after use. (Patient not taking: Reported on 08/28/2020) 1 each 10   glipiZIDE (GLUCOTROL XL) 5 MG 24 hr tablet Take by mouth.     glucose blood (ONE TOUCH ULTRA TEST) test strip 1 each by Other route 2 (two) times daily as needed for other. Dx: E11.9 100 each 12   HUMALOG KWIKPEN 100 UNIT/ML KwikPen 10 Units. Sliding scale up to 12 units     LANTUS SOLOSTAR 100 UNIT/ML Solostar Pen INJECT 10-20 UNITS INTO THE SKIN DAILY 5 pen 12   loperamide (IMODIUM) 2 MG capsule Take 1 capsule (2 mg total) by mouth See admin instructions. Initial: 4 mg, followed by 2 mg after each loose stool; maximum: 16 mg/day 90 capsule 0   megestrol (MEGACE) 400 MG/10ML suspension Take 10 mLs (400 mg total) by mouth daily. 240 mL 0   metFORMIN (GLUCOPHAGE) 500 MG tablet TAKE ONE TABLET BY MOUTH TWICE DAILY 60 tablet 12   Multiple Vitamin  (MULTIVITAMIN) tablet Take 1 tablet by mouth daily.      NOVOFINE 32G X 6 MM MISC USE AS DIRECTED. 5 INJECTIONS A DAY 900 each 0   omeprazole (PRILOSEC) 20 MG capsule Take 1 capsule (20 mg total) by mouth daily. (Patient not taking: Reported on 07/26/2020) 30 capsule 11   oxymetazoline (AFRIN) 0.05 % nasal spray Place 2 sprays into both nostrils 2 (two) times daily as needed.      pioglitazone (ACTOS) 30 MG tablet Take 30 mg by mouth daily.   1   pseudoephedrine-acetaminophen (TYLENOL SINUS) 30-500 MG TABS tablet Take 2 tablets by mouth every 4 (four) hours as needed. Only using twice daily (Patient not taking: Reported on 07/31/2020)     triamcinolone (NASACORT) 55 MCG/ACT AERO nasal inhaler Place 1 spray into the nose 2 (two) times daily. (Patient not taking: Reported on 02/03/2020) 1 Inhaler 2   No current facility-administered medications for this visit.    VITAL SIGNS: There were no  vitals taken for this visit. There were no vitals filed for this visit.  Estimated body mass index is 18.29 kg/m as calculated from the following:   Height as of 02/03/20: 5\' 6"  (1.676 m).   Weight as of an earlier encounter on 09/03/20: 113 lb 4.8 oz (51.4 kg).  LABS: CBC:    Component Value Date/Time   WBC 5.6 09/03/2020 1302   HGB 12.2 (L) 09/03/2020 1302   HGB 13.2 12/06/2018 0857   HCT 35.1 (L) 09/03/2020 1302   HCT 38.6 12/06/2018 0857   PLT 193 09/03/2020 1302   PLT 266 12/06/2018 0857   MCV 98.0 09/03/2020 1302   MCV 96 12/06/2018 0857   NEUTROABS 4.3 09/03/2020 1302   NEUTROABS 1.7 12/06/2018 0857   LYMPHSABS 0.7 09/03/2020 1302   LYMPHSABS 1.6 12/06/2018 0857   MONOABS 0.6 09/03/2020 1302   EOSABS 0.0 09/03/2020 1302   EOSABS 0.1 12/06/2018 0857   BASOSABS 0.0 09/03/2020 1302   BASOSABS 0.1 12/06/2018 0857   Comprehensive Metabolic Panel:    Component Value Date/Time   NA 137 09/03/2020 1302   NA 140 12/06/2018 0857   K 3.6 09/03/2020 1302   CL 103 09/03/2020 1302   CO2 29  09/03/2020 1302   BUN 13 09/03/2020 1302   BUN 9 12/06/2018 0857   CREATININE 0.44 (L) 09/03/2020 1302   GLUCOSE 252 (H) 09/03/2020 1302   CALCIUM 8.4 (L) 09/03/2020 1302   AST 17 09/03/2020 1302   AST 55 (H) 04/01/2017 0907   ALT 9 09/03/2020 1302   ALT 48 (H) 04/01/2017 0907   ALKPHOS 69 09/03/2020 1302   BILITOT 0.4 09/03/2020 1302   BILITOT <0.2 12/06/2018 0857   PROT 5.8 (L) 09/03/2020 1302   PROT 6.4 12/06/2018 0857   ALBUMIN 2.9 (L) 09/03/2020 1302   ALBUMIN 4.1 12/06/2018 0857    RADIOGRAPHIC STUDIES: MR Brain W Wo Contrast  Result Date: 08/13/2020 CLINICAL DATA:  Lung cancer follow-up EXAM: MRI HEAD WITHOUT AND WITH CONTRAST TECHNIQUE: Multiplanar, multiecho pulse sequences of the brain and surrounding structures were obtained without and with intravenous contrast. CONTRAST:  31mL GADAVIST GADOBUTROL 1 MMOL/ML IV SOLN COMPARISON:  March 2021 FINDINGS: Brain: There is a new punctate focus of enhancement along the posterior left frontal lobe involving cortex of the left superior frontal gyrus (series 13, image 134). There is no acute infarction or intracranial hemorrhage. There is no hydrocephalus. 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 Some of these areas demonstrate associated susceptibility likely reflecting chronic blood products. Other areas of patchy T2 hyperintensity in the supratentorial white matter likely reflects stable chronic microvascular ischemic changes. Vascular: Major vessel flow voids at the skull base are preserved. Skull and upper cervical spine: Normal marrow signal is preserved. Sinuses/Orbits: Paranasal sinuses are aerated. Orbits are unremarkable. Other: Sella is unremarkable.  Mastoid air cells are clear. IMPRESSION: New punctate focus  of enhancement in the posterior left frontal lobe. This could reflect a small metastatic lesion and attention on follow-up is recommended. Stable chronic findings detailed above. Electronically Signed   By: Macy Mis M.D.   On: 08/13/2020 16:44    PERFORMANCE STATUS (ECOG) : 1 - Symptomatic but completely ambulatory  Review of Systems Unless otherwise noted, a complete review of systems is negative.  Physical Exam General: NAD, frail appearing, thin Pulmonary: Unlabored  Extremities: no edema Skin: no rashes Neurological: Weakness but otherwise nonfocal  IMPRESSION: Routine follow-up visit.  Patient seen following his visit with Dr. Tasia Catchings.  Patient has decided to continue treatment with afatinib.  He reports being comfortable with that plan.  Symptoms today are markedly improved.  He reports eating and drinking more.  He is actually gained weight over the past several weeks.  He denies any distressing symptoms at present.  He feels overall better.  PLAN: -Continue current scope of treatment -RTC 1-2 weeks  Case and plan discussed with Dr. Tasia Catchings  Patient expressed understanding and was in agreement with this plan. He also understands that He can call the clinic at any time with any questions, concerns, or complaints.     Time Total: 15 minutes  Visit consisted of counseling and education dealing with the complex and emotionally intense issues of symptom management and palliative care in the setting of serious and potentially life-threatening illness.Greater than 50%  of this time was spent counseling and coordinating care related to the above assessment and plan.  Signed by: Altha Harm, PhD, NP-C

## 2020-09-04 MED FILL — LIDOCAINE 2% VISCOUS SOLN: 2 | 2 days supply | Qty: 100 | Fill #0

## 2020-09-04 MED FILL — GILOTRIF 20 MG TABLET: 20 | 30 days supply | Qty: 30 | Fill #0

## 2020-09-06 ENCOUNTER — Telehealth: Payer: Medicare Other | Admitting: Hospice and Palliative Medicine

## 2020-09-10 ENCOUNTER — Inpatient Hospital Stay: Payer: Medicare Other

## 2020-09-10 ENCOUNTER — Encounter: Payer: Self-pay | Admitting: Oncology

## 2020-09-10 ENCOUNTER — Inpatient Hospital Stay (HOSPITAL_BASED_OUTPATIENT_CLINIC_OR_DEPARTMENT_OTHER): Payer: Medicare Other | Admitting: Hospice and Palliative Medicine

## 2020-09-10 ENCOUNTER — Inpatient Hospital Stay (HOSPITAL_BASED_OUTPATIENT_CLINIC_OR_DEPARTMENT_OTHER): Payer: Medicare Other | Admitting: Oncology

## 2020-09-10 ENCOUNTER — Other Ambulatory Visit: Payer: Self-pay

## 2020-09-10 VITALS — BP 97/55 | HR 97 | Temp 96.0°F | Resp 16 | Wt 114.2 lb

## 2020-09-10 DIAGNOSIS — C3480 Malignant neoplasm of overlapping sites of unspecified bronchus and lung: Secondary | ICD-10-CM

## 2020-09-10 DIAGNOSIS — E876 Hypokalemia: Secondary | ICD-10-CM | POA: Diagnosis not present

## 2020-09-10 DIAGNOSIS — Z5111 Encounter for antineoplastic chemotherapy: Secondary | ICD-10-CM | POA: Diagnosis not present

## 2020-09-10 DIAGNOSIS — Z79899 Other long term (current) drug therapy: Secondary | ICD-10-CM | POA: Diagnosis not present

## 2020-09-10 DIAGNOSIS — R059 Cough, unspecified: Secondary | ICD-10-CM | POA: Diagnosis not present

## 2020-09-10 DIAGNOSIS — R0602 Shortness of breath: Secondary | ICD-10-CM | POA: Diagnosis not present

## 2020-09-10 DIAGNOSIS — F419 Anxiety disorder, unspecified: Secondary | ICD-10-CM | POA: Diagnosis not present

## 2020-09-10 DIAGNOSIS — E46 Unspecified protein-calorie malnutrition: Secondary | ICD-10-CM | POA: Diagnosis not present

## 2020-09-10 DIAGNOSIS — J449 Chronic obstructive pulmonary disease, unspecified: Secondary | ICD-10-CM | POA: Diagnosis not present

## 2020-09-10 DIAGNOSIS — Z923 Personal history of irradiation: Secondary | ICD-10-CM | POA: Diagnosis not present

## 2020-09-10 DIAGNOSIS — Z515 Encounter for palliative care: Secondary | ICD-10-CM

## 2020-09-10 DIAGNOSIS — Z66 Do not resuscitate: Secondary | ICD-10-CM | POA: Diagnosis not present

## 2020-09-10 DIAGNOSIS — E119 Type 2 diabetes mellitus without complications: Secondary | ICD-10-CM | POA: Diagnosis not present

## 2020-09-10 DIAGNOSIS — I959 Hypotension, unspecified: Secondary | ICD-10-CM | POA: Diagnosis not present

## 2020-09-10 DIAGNOSIS — Z9221 Personal history of antineoplastic chemotherapy: Secondary | ICD-10-CM | POA: Diagnosis not present

## 2020-09-10 DIAGNOSIS — C7800 Secondary malignant neoplasm of unspecified lung: Secondary | ICD-10-CM | POA: Diagnosis not present

## 2020-09-10 DIAGNOSIS — F17210 Nicotine dependence, cigarettes, uncomplicated: Secondary | ICD-10-CM | POA: Diagnosis not present

## 2020-09-10 LAB — CBC WITH DIFFERENTIAL/PLATELET
Abs Immature Granulocytes: 0.02 10*3/uL (ref 0.00–0.07)
Basophils Absolute: 0 10*3/uL (ref 0.0–0.1)
Basophils Relative: 1 %
Eosinophils Absolute: 0 10*3/uL (ref 0.0–0.5)
Eosinophils Relative: 0 %
HCT: 36.2 % — ABNORMAL LOW (ref 39.0–52.0)
Hemoglobin: 12.9 g/dL — ABNORMAL LOW (ref 13.0–17.0)
Immature Granulocytes: 0 %
Lymphocytes Relative: 11 %
Lymphs Abs: 0.6 10*3/uL — ABNORMAL LOW (ref 0.7–4.0)
MCH: 34.7 pg — ABNORMAL HIGH (ref 26.0–34.0)
MCHC: 35.6 g/dL (ref 30.0–36.0)
MCV: 97.3 fL (ref 80.0–100.0)
Monocytes Absolute: 0.5 10*3/uL (ref 0.1–1.0)
Monocytes Relative: 9 %
Neutro Abs: 4 10*3/uL (ref 1.7–7.7)
Neutrophils Relative %: 79 %
Platelets: 240 10*3/uL (ref 150–400)
RBC: 3.72 MIL/uL — ABNORMAL LOW (ref 4.22–5.81)
RDW: 13.3 % (ref 11.5–15.5)
WBC: 5 10*3/uL (ref 4.0–10.5)
nRBC: 0 % (ref 0.0–0.2)

## 2020-09-10 LAB — COMPREHENSIVE METABOLIC PANEL
ALT: 12 U/L (ref 0–44)
AST: 21 U/L (ref 15–41)
Albumin: 3.1 g/dL — ABNORMAL LOW (ref 3.5–5.0)
Alkaline Phosphatase: 73 U/L (ref 38–126)
Anion gap: 8 (ref 5–15)
BUN: 14 mg/dL (ref 8–23)
CO2: 27 mmol/L (ref 22–32)
Calcium: 8.3 mg/dL — ABNORMAL LOW (ref 8.9–10.3)
Chloride: 97 mmol/L — ABNORMAL LOW (ref 98–111)
Creatinine, Ser: 0.62 mg/dL (ref 0.61–1.24)
GFR, Estimated: >60 mL/min (ref >60)
Glucose, Bld: 341 mg/dL — ABNORMAL HIGH (ref 70–99)
Potassium: 3.2 mmol/L — ABNORMAL LOW (ref 3.5–5.1)
Sodium: 132 mmol/L — ABNORMAL LOW (ref 135–145)
Total Bilirubin: 0.5 mg/dL (ref 0.3–1.2)
Total Protein: 6.3 g/dL — ABNORMAL LOW (ref 6.5–8.1)

## 2020-09-10 MED ORDER — POTASSIUM CHLORIDE IN NACL 20-0.9 MEQ/L-% IV SOLN
Freq: Once | INTRAVENOUS | Status: AC
  Filled 2020-09-10: qty 1000

## 2020-09-10 NOTE — Progress Notes (Signed)
[place]  [phone] [phone]   Name: Colin Novak Date: 09/10/2020 MRN: [id number]  DOB: 09-Jan-1946  Patient Care Team: Guadalupe Maple, MD as PCP - General (Family Medicine) Solum, Betsey Holiday, MD as Physician Assistant (Endocrinology) Kem Parkinson, MD (Ophthalmology) Telford Nab, RN as Registered Nurse Tamala Julian, Jonette Eva, NP as Nurse Practitioner Earlie Server, MD as Consulting Physician (Oncology) Angelisa Winthrop, Kirt Boys, NP as Referring Physician (Hospice and Palliative Medicine)    REASON FOR CONSULTATION: Palliative Care consult requested for this 74 y.o. male with multiple medical problems including stage IV non-small cell lung cancer who initially refused chemotherapy/immunotherapy but received palliative XRT to the right upper lobe as mass is obstructing his bronchus.    PET scan on 08/02/2020 revealed disease progression in the lungs.  Patient was started on afatinib.  Patient was referred to palliative care to help address goals and manage ongoing symptoms.  SOCIAL HISTORY:     reports that he has been smoking cigarettes. He has been smoking about 1.00 pack per day. He quit smokeless tobacco use about 61 years ago.  His smokeless tobacco use included chew. He reports that he does not drink alcohol and does not use drugs.   Patient is a widower.  He lives at home alone with his dog.  He has a daughter who is involved in his care.  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  ADVANCE DIRECTIVES:  Not on file  CODE STATUS: DNR (DNR form signed on 09/01/2019 and again on 08/09/2020)  PAST MEDICAL HISTORY: Past Medical History:  Diagnosis Date  . Anxiety   . COPD (chronic obstructive pulmonary disease) ([place])   . Diabetes mellitus without complication ([place])    type 2  . GERD (gastroesophageal reflux disease)   . Hyperlipidemia     PAST SURGICAL HISTORY:  Past Surgical History:    Procedure Laterality Date  . APPENDECTOMY    . ENDOBRONCHIAL ULTRASOUND Bilateral 06/13/2019   Procedure: ENDOBRONCHIAL ULTRASOUND, BILATERAL;  Surgeon: [name], MD;  Location: [facility] ORS;  Service: Pulmonary;  Laterality: Bilateral;  . EYE SURGERY Bilateral    cataract    HEMATOLOGY/ONCOLOGY HISTORY:  Oncology History  Malignant neoplasm of lung ([place])  06/20/2019 Initial Diagnosis   Non-small cell lung cancer ([place])   01/16/2020 - 01/16/2020 Chemotherapy   The patient had dexamethasone (DECADRON) 4 MG tablet, 0 of 1 cycle, Start date: --, End date: -- palonosetron (ALOXI) injection 0.25 mg, 0.25 mg, Intravenous,  Once, 0 of 4 cycles PEMEtrexed (ALIMTA) 800 mg in sodium chloride 0.9 % 100 mL chemo infusion, 500 mg/m2, Intravenous,  Once, 0 of 4 cycles CARBOplatin (PARAPLATIN) in sodium chloride 0.9 % 100 mL chemo infusion, , Intravenous,  Once, 0 of 4 cycles fosaprepitant (EMEND) 150 mg in sodium chloride 0.9 % 145 mL IVPB, 150 mg, Intravenous,  Once, 0 of 4 cycles pembrolizumab (KEYTRUDA) 200 mg in sodium chloride 0.9 % 50 mL chemo infusion, 200 mg (original dose ), Intravenous, Once, 0 of 4 cycles Dose modification: 200 mg (Cycle 1, Reason: Other (see comments))  for chemotherapy treatment.    01/23/2020 - 01/23/2020 Chemotherapy   The patient had dexamethasone (DECADRON) 4 MG tablet, 1 of 1 cycle, Start date: 06/20/2019, End date: 01/16/2020 palonosetron (ALOXI) injection 0.25 mg, 0.25 mg, Intravenous,  Once, 0 of 4 cycles PEMEtrexed (ALIMTA) 775 mg in sodium chloride 0.9 % 100 mL chemo infusion, 500 mg/m2, Intravenous,  Once, 0 of 4 cycles CARBOplatin (PARAPLATIN) in sodium chloride 0.9 % 100 mL chemo infusion, , Intravenous,  Once, 0 of 4 cycles fosaprepitant (EMEND) 150 mg, dexamethasone (DECADRON) 12 mg in sodium chloride 0.9 % 145 mL IVPB, , Intravenous,  Once, 0 of 4 cycles  for chemotherapy treatment.    07/26/2020 -  Chemotherapy   The patient had [No matching  medication found in this treatment plan]  for chemotherapy treatment.      ALLERGIES:  is allergic to penicillins.  MEDICATIONS:  Current Outpatient Medications  Medication Sig Dispense Refill  . afatinib dimaleate (GILOTRIF) 20 MG tablet Take 1 tablet (20 mg total) by mouth daily. Take on an empty stomach 1hr before or 2hrs after meals. 30 tablet 2  . albuterol (PROAIR HFA) 108 (90 Base) MCG/ACT inhaler Inhale 1-2 puffs into the lungs every 6 (six) hours as needed for wheezing or shortness of breath (use as needed for chest congestion.). 1 g 3  . aspirin EC 81 MG tablet Take 81 mg by mouth daily.    . citalopram (CELEXA) 20 MG tablet Take 1 tablet (20 mg total) by mouth daily. 90 tablet 4  . Dextromethorphan-guaiFENesin (ROBITUSSIN DM PO) Take 5 mLs by mouth at bedtime.     Marland Kitchen ezetimibe-simvastatin (VYTORIN) 10-40 MG tablet Take 1 tablet by mouth daily. (Patient not taking: Reported on 09/10/2020) 90 tablet 4  . fludrocortisone (FLORINEF) 0.1 MG tablet Take 100 mcg by mouth daily.    . Fluticasone-Umeclidin-Vilant (TRELEGY ELLIPTA) 100-62.5-25 MCG/INH AEPB Inhale 1 applicator into the lungs daily. Rinse mouth after use. (Patient not taking: Reported on 08/28/2020) 1 each 10  . glipiZIDE (GLUCOTROL XL) 5 MG 24 hr tablet Take by mouth.    Marland Kitchen glucose blood (ONE TOUCH ULTRA TEST) test strip 1 each by Other route 2 (two) times daily as needed for other. Dx: E11.9 100 each 12  . HUMALOG KWIKPEN 100 UNIT/ML KwikPen 10 Units. Sliding scale up to 12 units    . LANTUS SOLOSTAR 100 UNIT/ML Solostar Pen INJECT 10-20 UNITS INTO THE SKIN DAILY 5 pen 12  . lidocaine (XYLOCAINE) 2 % solution Use as directed 15 mLs in the mouth or throat as needed for mouth pain. (Patient not taking: Reported on 09/10/2020) 100 mL 0  . loperamide (IMODIUM) 2 MG capsule Take 1 capsule (2 mg total) by mouth See admin instructions. Initial: 4 mg, followed by 2 mg after each loose stool; maximum: 16 mg/day (Patient not taking:  Reported on 09/10/2020) 90 capsule 0  . megestrol (MEGACE) 400 MG/10ML suspension Take 10 mLs (400 mg total) by mouth daily. 240 mL 0  . metFORMIN (GLUCOPHAGE) 500 MG tablet TAKE ONE TABLET BY MOUTH TWICE DAILY 60 tablet 12  . Multiple Vitamin (MULTIVITAMIN) tablet Take 1 tablet by mouth daily.     Marland Kitchen NOVOFINE 32G X 6 MM MISC USE AS DIRECTED. 5 INJECTIONS A DAY 900 each 0  . omeprazole (PRILOSEC) 20 MG capsule Take 1 capsule (20 mg total) by mouth daily. (Patient not taking: Reported on 07/26/2020) 30 capsule 11  . oxymetazoline (AFRIN) 0.05 % nasal spray Place 2 sprays into both nostrils 2 (two) times daily as needed.     . pioglitazone (ACTOS) 30 MG tablet Take 30 mg by mouth daily.   1  . pseudoephedrine-acetaminophen (TYLENOL SINUS) 30-500 MG TABS tablet Take 2 tablets by mouth every 4 (four) hours as needed. Only using twice daily (Patient not taking: Reported on 07/31/2020)    .  triamcinolone (NASACORT) 55 MCG/ACT AERO nasal inhaler Place 1 spray into the nose 2 (two) times daily. (Patient not taking: Reported on 02/03/2020) 1 Inhaler 2   No current facility-administered medications for this visit.   Facility-Administered Medications Ordered in Other Visits  Medication Dose Route Frequency Provider Last Rate Last Admin  . 0.9 % NaCl with KCl 20 mEq/ L  infusion   Intravenous Once Earlie Server, MD 999 mL/hr at 09/10/20 1430 New Bag at 09/10/20 1430    VITAL SIGNS: There were no vitals taken for this visit. There were no vitals filed for this visit.  Estimated body mass index is 18.43 kg/m as calculated from the following:   Height as of 02/03/20: 5\' 6"  (1.676 m).   Weight as of an earlier encounter on 09/10/20: 114 lb 3.2 oz (51.8 kg).  LABS: CBC:    Component Value Date/Time   WBC 5.0 09/10/2020 1256   HGB 12.9 (L) 09/10/2020 1256   HGB 13.2 12/06/2018 0857   HCT 36.2 (L) 09/10/2020 1256   HCT 38.6 12/06/2018 0857   PLT 240 09/10/2020 1256   PLT 266 12/06/2018 0857   MCV 97.3  09/10/2020 1256   MCV 96 12/06/2018 0857   NEUTROABS 4.0 09/10/2020 1256   NEUTROABS 1.7 12/06/2018 0857   LYMPHSABS 0.6 (L) 09/10/2020 1256   LYMPHSABS 1.6 12/06/2018 0857   MONOABS 0.5 09/10/2020 1256   EOSABS 0.0 09/10/2020 1256   EOSABS 0.1 12/06/2018 0857   BASOSABS 0.0 09/10/2020 1256   BASOSABS 0.1 12/06/2018 0857   Comprehensive Metabolic Panel:    Component Value Date/Time   NA 132 (L) 09/10/2020 1256   NA 140 12/06/2018 0857   K 3.2 (L) 09/10/2020 1256   CL 97 (L) 09/10/2020 1256   CO2 27 09/10/2020 1256   BUN 14 09/10/2020 1256   BUN 9 12/06/2018 0857   CREATININE 0.62 09/10/2020 1256   GLUCOSE 341 (H) 09/10/2020 1256   CALCIUM 8.3 (L) 09/10/2020 1256   AST 21 09/10/2020 1256   AST 55 (H) 04/01/2017 0907   ALT 12 09/10/2020 1256   ALT 48 (H) 04/01/2017 0907   ALKPHOS 73 09/10/2020 1256   BILITOT 0.5 09/10/2020 1256   BILITOT <0.2 12/06/2018 0857   PROT 6.3 (L) 09/10/2020 1256   PROT 6.4 12/06/2018 0857   ALBUMIN 3.1 (L) 09/10/2020 1256   ALBUMIN 4.1 12/06/2018 0857    RADIOGRAPHIC STUDIES: MR Brain W Wo Contrast  Result Date: 08/13/2020 CLINICAL DATA:  Lung cancer follow-up EXAM: MRI HEAD WITHOUT AND WITH CONTRAST TECHNIQUE: Multiplanar, multiecho pulse sequences of the brain and surrounding structures were obtained without and with intravenous contrast. CONTRAST:  68mL GADAVIST GADOBUTROL 1 MMOL/ML IV SOLN COMPARISON:  March 2021 FINDINGS: Brain: There is a new punctate focus of enhancement along the posterior left frontal lobe involving cortex of the left superior frontal gyrus (series 13, image 134). There is no acute infarction or intracranial hemorrhage. There is no hydrocephalus. 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 Some of these  areas demonstrate associated susceptibility likely reflecting chronic blood products. Other areas of patchy T2 hyperintensity in the supratentorial white matter likely reflects stable chronic microvascular ischemic changes. Vascular: Major vessel flow voids at the skull base are preserved. Skull and upper cervical spine: Normal marrow signal is preserved. Sinuses/Orbits: Paranasal sinuses are aerated. Orbits  are unremarkable. Other: Sella is unremarkable.  Mastoid air cells are clear. IMPRESSION: New punctate focus of enhancement in the posterior left frontal lobe. This could reflect a small metastatic lesion and attention on follow-up is recommended. Stable chronic findings detailed above. Electronically Signed   By: Macy Mis M.D.   On: 08/13/2020 16:44    PERFORMANCE STATUS (ECOG) : 1 - Symptomatic but completely ambulatory  Review of Systems Unless otherwise noted, a complete review of systems is negative.  Physical Exam General: NAD, frail appearing, thin Pulmonary: Unlabored Extremities: no edema Skin: no rashes Neurological: Weakness but otherwise nonfocal  IMPRESSION: Routine follow-up visit.  Patient seen in infusion  Patient states that his symptoms continue to improve.  He says that his appetite is better.  His weight does seem to be increasing.  He says that he has less fatigue.  Overall, patient feels better and denies any significant changes or concerns today.   PLAN: -Continue current scope of treatment -Virtual visit 1 month   Patient expressed understanding and was in agreement with this plan. He also understands that He can call the clinic at any time with any questions, concerns, or complaints.     Time Total: 15 minutes  Visit consisted of counseling and education dealing with the complex and emotionally intense issues of symptom management and palliative care in the setting of serious and potentially life-threatening illness.Greater than 50%  of this time was  spent counseling and coordinating care related to the above assessment and plan.  Signed by: Altha Harm, PhD, NP-C

## 2020-09-10 NOTE — Progress Notes (Signed)
Patient reports new neck pain, improving appetite and occasional constipation.

## 2020-09-10 NOTE — Progress Notes (Signed)
Hematology/Oncology follow up note [facility] Telephone:([phone]) [phone] [phone]) [phone]   Patient Care Team: Guadalupe Maple, MD as PCP - General (Family Medicine) Solum, Betsey Holiday, MD as Physician Assistant (Endocrinology) Kem Parkinson, MD (Ophthalmology) Telford Nab, RN as Registered Nurse Tamala Julian, Jonette Eva, NP as Nurse Practitioner Earlie Server, MD as Consulting Physician (Oncology) Borders, Kirt Boys, NP as Referring Physician (Hospice and Palliative Medicine)  REFERRING PROVIDER: Guadalupe Maple, MD  CHIEF COMPLAINTS/REASON FOR VISIT:  Follow up for non-small cell lung cancer  HISTORY OF PRESENTING ILLNESS:  Colin Novak is a  74 y.o.  male with PMH listed below was seen in consultation at the request of  Guadalupe Maple, MD  for evaluation of lung mass Patient reports ongoing shortness of breath, cough for  few months. He had CT chest scan done on 06/01/2019 which showed a concerning feature of 9.1 x 5.1 x 3.6 cm right upper lobe mass extending into mediastinum, encasing and occluding right upper lobe bronchus and some of the right upper lobe bronchial branches.  There is also significant narrowing of FVC. He also had a similar appearance of left upper lobe mass without mediastinal extension suspicious for a synchronized primary lung cancer Multiple additional smaller bilateral lung nodules, left greater than right. Mildly enlarged right hilar and mediastinal lymph nodes suspicious for metastatic lymphadenopathy. 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 Daughter [name] is his power of attorney. Denies fever or chills.  # patient was seen by me on 06/20/2019.  At that time, patient has 2 separate lung tumor.  Possibility of stage IV lung adenocarcinoma TX N1 M1 discussed with patient given that he has tumor in the contralateral lobe.  Systemic  chemotherapy with immunotherapy was recommended.  Patient declined at that time. He may have 2 separate primary lung cancer, if that is the case, he has stage III lung cancer.  would benefit from definitive concurrent chemoradiation followed by immunotherapy for maintenance. Patient has decided at that time that he does not want any systemic chemotherapy.   08/05/2019 He finished radiation to right upper lobe lesion.  09/16/2019 s/p radiation boost  01/06/2020 CT scan showed interval increase of the right para mediastinal and hilar mass and also multiple areas of increased density in the left lower lobe consistent with multifocal adenocarcinoma/metastasis.  #01/23/2020, PET scan showed a decreased left upper lobe and right upper lobe lesion. There is still occlusion of the right upper lobe bronchus with some associated atelectasis.Enlarged left lung nodules have not had increased metabolic activity and remained well below blood pool.New sclerosis in the upper sternal body comparing to previous PET scan. -Patient was not interested in any systemic treatment.  Declined immunotherapy maintenance.  NGS was sent.  he follows up with palliative care service.  # 08/02/2020 PET scan images were reviewed  No recurrent hypermetabolism at the site of masslike fibrosis in paramediastinal right upper lobe  Mildly hypermetabolic left upper lobe 4.7QQ mass, not changed since last PET 6 months ago,  Numerous (>10) irregular pulmonary nodules scattered through out bilateral lungs,increased or new from last study. Non has significant FDG uptake, compatible with progression of metastatic disease. No recurrent hypermetabolism at the site of masslike fibrosis in the right upper lobe. No recurrent hypermetabolic thoracic Adenopathy. No hypermetabolic metastatic disease in the abdomen, pelvis or Skeleton  INTERVAL HISTORY Colin Novak is a 74 y.o. male who has  above history reviewed by me today presents for follow up visit  for management of  non-small cell lung cancer. Problems and complaints are listed below: 07/31/2020-08/29/2020  Afatinib 106m daily- Stopped due to mucositis which affected his eating.  09/09/2020 restarted on Afatinib 21mdaily.  Today he reports feeling well. Mucositis has improved and appetite is better.  He has gained weight   Review of Systems  Constitutional: Positive for fatigue. Negative for appetite change, chills, diaphoresis, fever and unexpected weight change.  HENT:   Negative for hearing loss, lump/mass, nosebleeds, sore throat and voice change.        Mouth pain has improved.  Eyes: Negative for eye problems and icterus.  Respiratory: Negative for chest tightness, cough, hemoptysis, shortness of breath and wheezing.   Cardiovascular: Negative for chest pain and leg swelling.  Gastrointestinal: Negative for abdominal distention, abdominal pain, blood in stool, diarrhea, nausea and rectal pain.  Endocrine: Negative for hot flashes.  Genitourinary: Negative for bladder incontinence, difficulty urinating, dysuria, frequency, hematuria and nocturia.   Musculoskeletal: Positive for back pain. Negative for arthralgias, flank pain, gait problem and myalgias.  Skin: Negative for itching and rash.  Neurological: Negative for dizziness, gait problem, headaches, light-headedness, numbness and seizures.  Hematological: Negative for adenopathy. Does not bruise/bleed easily.  Psychiatric/Behavioral: Negative for confusion and decreased concentration. The patient is not nervous/anxious.     MEDICAL HISTORY:  Past Medical History:  Diagnosis Date  . Anxiety   . COPD (chronic obstructive pulmonary disease) ([facility]  . Diabetes mellitus without complication ([facility]   type 2  . GERD (gastroesophageal reflux disease)   . Hyperlipidemia     SURGICAL HISTORY: Past Surgical History:  Procedure Laterality Date  . APPENDECTOMY    . ENDOBRONCHIAL ULTRASOUND Bilateral 06/13/2019   Procedure:  ENDOBRONCHIAL ULTRASOUND, BILATERAL;  Surgeon: RaLaverle HobbyMD;  Location: [facility] ORS;  Service: Pulmonary;  Laterality: Bilateral;  . EYE SURGERY Bilateral    cataract    SOCIAL HISTORY: Social History   Socioeconomic History  . Marital status: Widowed    Spouse name: Not on file  . Number of children: 1  . Years of education: Not on file  . Highest education level: High school graduate  Occupational History  . Occupation: retired  Tobacco Use  . Smoking status: Current Every Day Smoker    Packs/day: 1.00    Types: Cigarettes  . Smokeless tobacco: Former [employer] developer  Types: Chew    Quit date: 06/10/1959  . Tobacco comment: 1 pack daily- 06/09/2019  Vaping Use  . Vaping Use: Never used  Substance and Sexual Activity  . Alcohol use: No  . Drug use: No  . Sexual activity: Not Currently  Other Topics Concern  . Not on file  Social History Narrative  . Not on file   Social Determinants of Health   Financial Resource Strain:   . Difficulty of Paying Living Expenses: Not on file  Food Insecurity:   . Worried About RuCharity fundraisern the Last Year: Not on file  . Ran Out of Food in the Last Year: Not on file  Transportation Needs:   . Lack of Transportation (Medical): Not on file  . Lack of Transportation (Non-Medical): Not on file  Physical Activity:   . Days of Exercise per Week: Not on file  . Minutes of Exercise per Session: Not on file  Stress:   . Feeling of Stress : Not on file  Social Connections:   .  Frequency of Communication with Friends and Family: Not on file  . Frequency of Social Gatherings with Friends and Family: Not on file  . Attends Religious Services: Not on file  . Active Member of Clubs or Organizations: Not on file  . Attends Archivist Meetings: Not on file  . Marital Status: Not on file  Intimate Partner Violence:   . Fear of Current or Ex-Partner: Not on file  . Emotionally Abused: Not on file  . Physically Abused: Not on  file  . Sexually Abused: Not on file    FAMILY HISTORY: Family History  Problem Relation Age of Onset  . Cancer Mother        lung (non smoker)  . Diabetes Father   . Hypertension Father   . Lymphoma Brother   . Prostate cancer Brother     ALLERGIES:  is allergic to penicillins.  MEDICATIONS:  Current Outpatient Medications  Medication Sig Dispense Refill  . afatinib dimaleate (GILOTRIF) 20 MG tablet Take 1 tablet (20 mg total) by mouth daily. Take on an empty stomach 1hr before or 2hrs after meals. 30 tablet 2  . albuterol (PROAIR HFA) 108 (90 Base) MCG/ACT inhaler Inhale 1-2 puffs into the lungs every 6 (six) hours as needed for wheezing or shortness of breath (use as needed for chest congestion.). 1 g 3  . aspirin EC 81 MG tablet Take 81 mg by mouth daily.    . citalopram (CELEXA) 20 MG tablet Take 1 tablet (20 mg total) by mouth daily. 90 tablet 4  . Dextromethorphan-guaiFENesin (ROBITUSSIN DM PO) Take 5 mLs by mouth at bedtime.     . fludrocortisone (FLORINEF) 0.1 MG tablet Take 100 mcg by mouth daily.    Marland Kitchen glipiZIDE (GLUCOTROL XL) 5 MG 24 hr tablet Take by mouth.    Marland Kitchen glucose blood (ONE TOUCH ULTRA TEST) test strip 1 each by Other route 2 (two) times daily as needed for other. Dx: E11.9 100 each 12  . HUMALOG KWIKPEN 100 UNIT/ML KwikPen 10 Units. Sliding scale up to 12 units    . LANTUS SOLOSTAR 100 UNIT/ML Solostar Pen INJECT 10-20 UNITS INTO THE SKIN DAILY 5 pen 12  . megestrol (MEGACE) 400 MG/10ML suspension Take 10 mLs (400 mg total) by mouth daily. 240 mL 0  . metFORMIN (GLUCOPHAGE) 500 MG tablet TAKE ONE TABLET BY MOUTH TWICE DAILY 60 tablet 12  . Multiple Vitamin (MULTIVITAMIN) tablet Take 1 tablet by mouth daily.     Marland Kitchen NOVOFINE 32G X 6 MM MISC USE AS DIRECTED. 5 INJECTIONS A DAY 900 each 0  . oxymetazoline (AFRIN) 0.05 % nasal spray Place 2 sprays into both nostrils 2 (two) times daily as needed.     . ezetimibe-simvastatin (VYTORIN) 10-40 MG tablet Take 1 tablet by  mouth daily. (Patient not taking: Reported on 09/10/2020) 90 tablet 4  . Fluticasone-Umeclidin-Vilant (TRELEGY ELLIPTA) 100-62.5-25 MCG/INH AEPB Inhale 1 applicator into the lungs daily. Rinse mouth after use. (Patient not taking: Reported on 08/28/2020) 1 each 10  . lidocaine (XYLOCAINE) 2 % solution Use as directed 15 mLs in the mouth or throat as needed for mouth pain. (Patient not taking: Reported on 09/10/2020) 100 mL 0  . loperamide (IMODIUM) 2 MG capsule Take 1 capsule (2 mg total) by mouth See admin instructions. Initial: 4 mg, followed by 2 mg after each loose stool; maximum: 16 mg/day (Patient not taking: Reported on 09/10/2020) 90 capsule 0  . omeprazole (PRILOSEC) 20 MG capsule Take 1 capsule (  20 mg total) by mouth daily. (Patient not taking: Reported on 07/26/2020) 30 capsule 11  . pioglitazone (ACTOS) 30 MG tablet Take 30 mg by mouth daily.   1  . pseudoephedrine-acetaminophen (TYLENOL SINUS) 30-500 MG TABS tablet Take 2 tablets by mouth every 4 (four) hours as needed. Only using twice daily (Patient not taking: Reported on 07/31/2020)    . triamcinolone (NASACORT) 55 MCG/ACT AERO nasal inhaler Place 1 spray into the nose 2 (two) times daily. (Patient not taking: Reported on 02/03/2020) 1 Inhaler 2   No current facility-administered medications for this visit.     PHYSICAL EXAMINATION: ECOG PERFORMANCE STATUS: 2 - Symptomatic, <50% confined to bed Vitals:   09/10/20 1317  BP: (!) 97/55  Pulse: 97  Resp: 16  Temp: (!) 96 F (35.6 C)  SpO2: 96%   Filed Weights   09/10/20 1317  Weight: 114 lb 3.2 oz (51.8 kg)    Physical Exam Constitutional:      General: He is not in acute distress.    Appearance: He is ill-appearing.     Comments: Cachectic  HENT:     Head: Normocephalic and atraumatic.  Eyes:     General: No scleral icterus.    Pupils: Pupils are equal, round, and reactive to light.  Cardiovascular:     Rate and Rhythm: Normal rate and regular rhythm.     Heart  sounds: Normal heart sounds.  Pulmonary:     Effort: Pulmonary effort is normal. No respiratory distress.     Breath sounds: No wheezing.     Comments: Decreased breath sounds bilaterally.  Abdominal:     General: Bowel sounds are normal. There is no distension.     Palpations: Abdomen is soft. There is no mass.  Musculoskeletal:        General: No deformity. Normal range of motion.     Cervical back: Normal range of motion and neck supple.     Comments: Clubbing  Skin:    General: Skin is warm and dry.     Findings: No erythema or rash.  Neurological:     General: No focal deficit present.     Mental Status: He is alert and oriented to person, place, and time. Mental status is at baseline.     Cranial Nerves: No cranial nerve deficit.     Coordination: Coordination normal.  Psychiatric:        Mood and Affect: Mood normal.     LABORATORY DATA:  I have reviewed the data as listed Lab Results  Component Value Date   WBC 5.0 09/10/2020   HGB 12.9 (L) 09/10/2020   HCT 36.2 (L) 09/10/2020   MCV 97.3 09/10/2020   PLT 240 09/10/2020   Recent Labs    01/16/20 0942 07/24/20 1104 07/26/20 1001 07/31/20 1059 08/27/20 1324 09/03/20 1302 09/10/20 1256  NA 132*  --  130*   < > 136 137 132*  K 4.9  --  4.4   < > 4.1 3.6 3.2*  CL 98  --  92*   < > 101 103 97*  CO2 24  --  26   < > _0 GLUCOSE 460*  --  395*   < > 317* 252* 341*  BUN 13  --  15   < > _1 CREATININE 0.80   < > 0.77   < > 0.75 0.44* 0.62  CALCIUM 9.3  --  9.0   < > 8.7* 8.4*  8.3*  GFRNONAA >60  --  >60   < > >60 >60 >60  GFRAA >60  --  >60  --   --   --   --   PROT 7.7  --  7.1   < > 5.7* 5.8* 6.3*  ALBUMIN 4.4  --  4.0   < > 3.0* 2.9* 3.1*  AST 30  --  15   < > _0 ALT 33  --  12   < > _1 ALKPHOS 110  --  93   < > 73 69 73  BILITOT 0.9  --  1.0   < > 0.3 0.4 0.5   < > = values in this interval not displayed.   Iron/TIBC/Ferritin/ %Sat No results found for: IRON, TIBC, FERRITIN,  IRONPCTSAT    RADIOGRAPHIC STUDIES: I have personally reviewed the radiological images as listed and agreed with the findings in the report.  MR Brain W Wo Contrast  Result Date: 08/13/2020 CLINICAL DATA:  Lung cancer follow-up EXAM: MRI HEAD WITHOUT AND WITH CONTRAST TECHNIQUE: Multiplanar, multiecho pulse sequences of the brain and surrounding structures were obtained without and with intravenous contrast. CONTRAST:  53m GADAVIST GADOBUTROL 1 MMOL/ML IV SOLN COMPARISON:  March 2021 FINDINGS: Brain: There is a new punctate focus of enhancement along the posterior left frontal lobe involving cortex of the left superior frontal gyrus (series 13, image 134). There is no acute infarction or intracranial hemorrhage. There is no hydrocephalus. 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 Some of these areas demonstrate associated susceptibility likely reflecting chronic blood products. Other areas of patchy T2 hyperintensity in the supratentorial white matter likely reflects stable chronic microvascular ischemic changes. Vascular: Major vessel flow voids at the skull base are preserved. Skull and upper cervical spine: Normal marrow signal is preserved. Sinuses/Orbits: Paranasal sinuses are aerated. Orbits are unremarkable. Other: Sella is unremarkable.  Mastoid air cells are clear. IMPRESSION: New punctate focus of enhancement in the posterior left frontal lobe. This could reflect a small metastatic lesion and attention on follow-up is recommended. Stable chronic findings detailed above. Electronically Signed   By: PMacy MisM.D.   On: 08/13/2020 16:44      ASSESSMENT & PLAN:  1. Malignant neoplasm of overlapping sites of lung, unspecified laterality ([place]   2. Encounter for  antineoplastic chemotherapy   3. Protein-calorie malnutrition, unspecified severity ([place]   4. 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  NGS EGFR [id number]+, [id number] PD-L1 30% Labs are reviewed and discussed with patient. He restarted on Afatinib, so far tolerates well.  Continue current regimen.   # Mucositis, improved.  Recommend him to continue using viscous lidocaine if he experiences mouth pain again after resumed on afatinib 20 mg daily  #Hypokalemia,   Patient will proceed with 1 L of IV fluid NS x 1 + 274m Kcl #Malnutition Weight loss, patient has gained weight.  Continue nutrition supplementation.   #Hypotension, normal cortisol level.  IV normal saline 1 L x 1..  Follow-up in 2 weeks All questions were answered. The patient knows to call the clinic with any problems questions or concerns.  ZhEarlie ServerMD, PhD Hematology Oncology CoGastroenterology Care Inct AlNorthern Nevada Medical Centerager- [phone]  09/10/2020

## 2020-09-12 ENCOUNTER — Other Ambulatory Visit: Payer: Medicare Other | Admitting: Primary Care

## 2020-09-12 ENCOUNTER — Other Ambulatory Visit: Payer: Self-pay

## 2020-09-12 DIAGNOSIS — Z515 Encounter for palliative care: Secondary | ICD-10-CM | POA: Diagnosis not present

## 2020-09-12 DIAGNOSIS — C3490 Malignant neoplasm of unspecified part of unspecified bronchus or lung: Secondary | ICD-10-CM | POA: Diagnosis not present

## 2020-09-12 NOTE — Progress Notes (Signed)
[facility] Palliative Care Consult Note Telephone: [phone]  Fax: [phone]     Date of encounter: 09/12/20 PATIENT NAME: Colin Novak [address] [place] [id number] [phone] (home)  DOB: 07/21/1946 MRN: [id number]  PRIMARY CARE PROVIDER:    Guadalupe Maple, MD,  No address on file None  REFERRING PROVIDER:   Borders, Colin Boys, NP [place],  [place] [address]   RESPONSIBLE PARTY:   Extended Emergency Contact Information Primary Emergency Contact: Colin Novak Mobile Phone: [phone] Relation: Daughter Secondary Emergency Contact: Colin Novak Mobile Phone: [phone] Relation: Sister Interpreter needed? No  I met face to face with patient and family in his home. Palliative Care was asked to follow this patient by consultation request of  Novak, Colin Boys, NP  to help address advance care planning and goals of care. This is a follow up  visit.   ASSESSMENT AND RECOMMENDATIONS:   1. Advance Care Planning/Goals of Care: Goals include to maximize quality of life and symptom management. Our advance care planning conversation included a discussion about desire to pursue treatment therapy. He is aware it is palliative but is tolerating more and is pursuing oral chemo and regularly scheduled fluids and electrolytes. DNR is posted in his home.  2. Symptom Management:   Nocturia: Endorses > 3-4 episodes nightly. State he saw urology a few years ago for BPH. Due to his bp issues he may not be able to tolerate tamsulosin. We discussed him decreasing evening fluids. He currently does not curtail these, and in fact drinks caffeinated  coffee with dinner. He will reduce the fluid volume and try decaf . I also instructed in Cred to evacuate bladder more fully.  BG management: In 440's this am. States he has forgotten lantus some nights due to drowsiness. He will start giving lantus 14 units at supper time before he gets  too tired. 14 day average per his meter is 350's. Glucose levels trending up.Will d/c one of his appetite stimulants to see if his glucose responds to less steroid. He is eating better now and will restart meds if his appetite falters again.  Stomatitis; Improved with decreased dose of afatinib. Weight is 114 lbs 09/10/20. Has gotten down to 96 lbs in 3/21.   BP: It was WNL today without sx. He does have 2+ ankle edema.Recommended compression and LE elevation. Education RE rising slowly to avoid orthostasis.  3. Follow up Palliative Care Visit: Palliative care will continue to follow for goals of care clarification and symptom management. Return 4-6 weeks or prn.  4. Family /Caregiver/Community Supports: Lives at home alone with family support. Family report no caregiver strain at this time.  5. Cognitive / Functional decline:   I spent 60 minutes providing this consultation,  from [place] to 0945. More than 50% of the time in this consultation was spent coordinating communication.   CODE STATUS:DNR  PPS: 50%  HOSPICE ELIGIBILITY/DIAGNOSIS: no  Subjective:  CHIEF COMPLAINT: nocturia and hypotension  HISTORY OF PRESENT ILLNESS:  Colin Novak is a 74 y.o. year old male  with NSCLC and current Rx with afatinib. H/o low bp and orthostasis, DM with moderate control and poor po intake.He has had reduced dosing of afatinib which has helped with oral ulcers. He also has increased weight due to better po intake.  .  We are asked to consult around ongoing goal clarification, and management of chronic therapy symptoms and sequelae.    History obtained  from review of EMR, discussion with primary team, and  interview with family, caregiver  and/or Colin Novak. Records reviewed and summarized above.   Review or lab tests re recent CBC and CMP. Review of notes from oncology provider and recent clinic visits.   CURRENT PROBLEM LIST:  Patient Active Problem List   Diagnosis Date Noted   Encounter  for antineoplastic chemotherapy 08/09/2020   NSCLC with EGFR mutation ([place]) 08/09/2020   Hypotension due to hypovolemia 07/26/2020   Hyponatremia 07/26/2020   Type 2 diabetes mellitus without complication, with long-term current use of insulin ([place]) 02/03/2020   Malignant neoplasm of overlapping sites of lung ([place]) 01/16/2020   Goals of care, counseling/discussion 06/20/2019   Protein-calorie malnutrition ([place]) 06/20/2019   Malignant neoplasm of lung ([place]) 06/20/2019   Moderate protein-calorie malnutrition ([place]) 05/24/2018   Tobacco dependence 05/24/2018   Underweight 05/24/2018   Weight loss, unintentional 05/24/2018   Edema extremities 11/19/2017   Depression, major, single episode, moderate ([place]) 11/19/2017   Advanced care planning/counseling discussion 11/19/2017   Senile purpura ([place]) 04/01/2017   COPD (chronic obstructive pulmonary disease) ([place]) 07/24/2015   Malnutrition ([place]) 07/24/2015   Skin lesion 07/24/2015   BPH (benign prostatic hyperplasia) 07/24/2015   Diabetes mellitus associated with hormonal etiology ([place]) 04/11/2015   Hyperlipidemia 04/11/2015   PAST MEDICAL HISTORY:  Active Ambulatory Problems    Diagnosis Date Noted   Diabetes mellitus associated with hormonal etiology ([place]) 04/11/2015   Hyperlipidemia 04/11/2015   COPD (chronic obstructive pulmonary disease) ([place]) 07/24/2015   Malnutrition ([place]) 07/24/2015   Skin lesion 07/24/2015   BPH (benign prostatic hyperplasia) 07/24/2015   Senile purpura ([place]) 04/01/2017   Edema extremities 11/19/2017   Depression, major, single episode, moderate ([place]) 11/19/2017   Advanced care planning/counseling discussion 11/19/2017   Moderate protein-calorie malnutrition ([place]) 05/24/2018   Tobacco dependence 05/24/2018   Underweight 05/24/2018   Weight loss, unintentional 05/24/2018   Goals of care, counseling/discussion 06/20/2019   Protein-calorie malnutrition ([place]) 06/20/2019    Malignant neoplasm of lung ([place]) 06/20/2019   Malignant neoplasm of overlapping sites of lung ([place]) 01/16/2020   Type 2 diabetes mellitus without complication, with long-term current use of insulin ([place]) 02/03/2020   Hypotension due to hypovolemia 07/26/2020   Hyponatremia 07/26/2020   Encounter for antineoplastic chemotherapy 08/09/2020   NSCLC with EGFR mutation ([place]) 08/09/2020   Resolved Ambulatory Problems    Diagnosis Date Noted   Uncontrolled type 2 diabetes mellitus with hyperglycemia, with long-term current use of insulin ([place]) 01/29/2017   Past Medical History:  Diagnosis Date   Anxiety    Diabetes mellitus without complication (HCC)    GERD (gastroesophageal reflux disease)    SOCIAL HX:  Social History   Tobacco Use   Smoking status: Current Every Day Smoker    Packs/day: 1.00    Types: Cigarettes   Smokeless tobacco: Former Systems developer    Types: Chew    Quit date: 06/10/1959   Tobacco comment: 1 pack daily- 06/09/2019  Substance Use Topics   Alcohol use: No   FAMILY HX:  Family History  Problem Relation Age of Onset   Cancer Mother        lung (non smoker)   Diabetes Father    Hypertension Father    Lymphoma Brother    Prostate cancer Brother     ALLERGIES:  Allergies  Allergen Reactions   Penicillins Rash     PERTINENT MEDICATIONS:  Outpatient Encounter Medications as of 09/12/2020  Medication Sig   afatinib  dimaleate (GILOTRIF) 20 MG tablet Take 1 tablet (20 mg total) by mouth daily. Take on an empty stomach 1hr before or 2hrs after meals.   albuterol (PROAIR HFA) 108 (90 Base) MCG/ACT inhaler Inhale 1-2 puffs into the lungs every 6 (six) hours as needed for wheezing or shortness of breath (use as needed for chest congestion.).   aspirin EC 81 MG tablet Take 81 mg by mouth daily.   citalopram (CELEXA) 20 MG tablet Take 1 tablet (20 mg total) by mouth daily.   Dextromethorphan-guaiFENesin (ROBITUSSIN DM PO) Take 5 mLs by mouth at  bedtime.    ezetimibe-simvastatin (VYTORIN) 10-40 MG tablet Take 1 tablet by mouth daily. (Patient not taking: Reported on 09/10/2020)   fludrocortisone (FLORINEF) 0.1 MG tablet Take 100 mcg by mouth daily.   Fluticasone-Umeclidin-Vilant (TRELEGY ELLIPTA) 100-62.5-25 MCG/INH AEPB Inhale 1 applicator into the lungs daily. Rinse mouth after use. (Patient not taking: Reported on 08/28/2020)   glipiZIDE (GLUCOTROL XL) 5 MG 24 hr tablet Take by mouth.   glucose blood (ONE TOUCH ULTRA TEST) test strip 1 each by Other route 2 (two) times daily as needed for other. Dx: E11.9   HUMALOG KWIKPEN 100 UNIT/ML KwikPen 10 Units. Sliding scale up to 12 units   LANTUS SOLOSTAR 100 UNIT/ML Solostar Pen INJECT 10-20 UNITS INTO THE SKIN DAILY   lidocaine (XYLOCAINE) 2 % solution Use as directed 15 mLs in the mouth or throat as needed for mouth pain. (Patient not taking: Reported on 09/10/2020)   loperamide (IMODIUM) 2 MG capsule Take 1 capsule (2 mg total) by mouth See admin instructions. Initial: 4 mg, followed by 2 mg after each loose stool; maximum: 16 mg/day (Patient not taking: Reported on 09/10/2020)   megestrol (MEGACE) 400 MG/10ML suspension Take 10 mLs (400 mg total) by mouth daily.   metFORMIN (GLUCOPHAGE) 500 MG tablet TAKE ONE TABLET BY MOUTH TWICE DAILY   Multiple Vitamin (MULTIVITAMIN) tablet Take 1 tablet by mouth daily.    NOVOFINE 32G X 6 MM MISC USE AS DIRECTED. 5 INJECTIONS A DAY   omeprazole (PRILOSEC) 20 MG capsule Take 1 capsule (20 mg total) by mouth daily. (Patient not taking: Reported on 07/26/2020)   oxymetazoline (AFRIN) 0.05 % nasal spray Place 2 sprays into both nostrils 2 (two) times daily as needed.    pioglitazone (ACTOS) 30 MG tablet Take 30 mg by mouth daily.    pseudoephedrine-acetaminophen (TYLENOL SINUS) 30-500 MG TABS tablet Take 2 tablets by mouth every 4 (four) hours as needed. Only using twice daily (Patient not taking: Reported on 07/31/2020)   triamcinolone  (NASACORT) 55 MCG/ACT AERO nasal inhaler Place 1 spray into the nose 2 (two) times daily. (Patient not taking: Reported on 02/03/2020)   [DISCONTINUED] prochlorperazine (COMPAZINE) 10 MG tablet Take 1 tablet (10 mg total) by mouth every 6 (six) hours as needed (Nausea or vomiting). (Patient not taking: Reported on 01/13/2020)   No facility-administered encounter medications on file as of 09/12/2020.     Objective: ROS  General: NAD EYES: denies vision changes, wears glasses ENMT: denies dysphagia Cardiovascular: denies chest pain Pulmonary: + productive cough, denies increased SOB, 1 pack a day smoker Abdomen: endorses fair appetite, denies constipation, endorses continence of bowel GU: denies dysuria, endorses continence of urine MSK:  endorses ROM limitations, denies falls Skin: denies rashes or wounds Neurological: endorses weakness, denies pain, endorses insomnia due to nocturia Psych: Endorses positive mood Heme/lymph/immuno: denies widespread bruises, abnormal bleeding  Physical Exam: Current and past weights: 114 lbs Constitutional:  121/63 HR  85 RR 18 General: frail appearing, thin EYES: anicteric sclera,lids intact, no discharge  ENMT: intact hearing,oral mucous membranes moist, missing dentition CV: S1S2, RRR, +2  LE edema to ankles Pulmonary: lungs clear with occ rhonchi, no increased work of breathing, no cough, room air Abdomen: intake 50-75%%,  no ascites GU: deferred MSK: severe sacropenia, decreased ROM in all extremities, no contractures of LE, ambulatory in home, out of home with cane. Skin: warm and dry, no rashes or wounds on visible skin Neuro: Weakness, cognitive impairment, grossly non -focal Psych: non -anxious affect, A and O x 3 Hem/lymph/immuno/ no widespread bruising   Thank you for the opportunity to participate in the care of Mr. Iwanicki.  The palliative care team will continue to follow. Please call our office at [phone] if we can be of  additional assistance.  Jason Coop, NP , DNP, MPH, AGPCNP-BC, ACHPN  COVID-19 PATIENT SCREENING TOOL  Person answering questions: ____________self______ _____   1.  Is the patient or any family member in the home showing any signs or symptoms regarding respiratory infection?               Person with Symptom- __________NA_________________  a. Fever                                                                          Yes___ No___          ___________________  b. Shortness of breath                                                    Yes___ No___          ___________________ c. Cough/congestion                                       Yes___  No___         ___________________ d. Body aches/pains                                                         Yes___ No___        ____________________ e. Gastrointestinal symptoms (diarrhea, nausea)           Yes___ No___        ____________________  2. Within the past 14 days, has anyone living in the home had any contact with someone with or under investigation for COVID-19?    Yes___ No_X_   Person __________________

## 2020-09-27 ENCOUNTER — Other Ambulatory Visit: Payer: Self-pay

## 2020-09-27 ENCOUNTER — Inpatient Hospital Stay (HOSPITAL_BASED_OUTPATIENT_CLINIC_OR_DEPARTMENT_OTHER): Payer: Medicare Other | Admitting: Oncology

## 2020-09-27 ENCOUNTER — Inpatient Hospital Stay: Payer: Medicare Other | Attending: Oncology

## 2020-09-27 ENCOUNTER — Emergency Department
Admission: EM | Admit: 2020-09-27 | Discharge: 2020-09-27 | Disposition: A | Discharge status: Home / Self Care | Payer: Medicare Other | Attending: Emergency Medicine | Admitting: Emergency Medicine

## 2020-09-27 ENCOUNTER — Inpatient Hospital Stay: Payer: Medicare Other

## 2020-09-27 ENCOUNTER — Inpatient Hospital Stay (HOSPITAL_BASED_OUTPATIENT_CLINIC_OR_DEPARTMENT_OTHER): Payer: Medicare Other | Admitting: Hospice and Palliative Medicine

## 2020-09-27 ENCOUNTER — Telehealth: Payer: Self-pay | Admitting: Family Medicine

## 2020-09-27 ENCOUNTER — Emergency Department: Payer: Medicare Other

## 2020-09-27 ENCOUNTER — Encounter: Payer: Self-pay | Admitting: Oncology

## 2020-09-27 VITALS — BP 90/57 | HR 98 | Temp 96.0°F | Resp 16 | Wt 105.5 lb

## 2020-09-27 DIAGNOSIS — E785 Hyperlipidemia, unspecified: Secondary | ICD-10-CM | POA: Diagnosis not present

## 2020-09-27 DIAGNOSIS — E1165 Type 2 diabetes mellitus with hyperglycemia: Secondary | ICD-10-CM | POA: Insufficient documentation

## 2020-09-27 DIAGNOSIS — J439 Emphysema, unspecified: Secondary | ICD-10-CM | POA: Diagnosis not present

## 2020-09-27 DIAGNOSIS — K219 Gastro-esophageal reflux disease without esophagitis: Secondary | ICD-10-CM | POA: Insufficient documentation

## 2020-09-27 DIAGNOSIS — Z7982 Long term (current) use of aspirin: Secondary | ICD-10-CM | POA: Insufficient documentation

## 2020-09-27 DIAGNOSIS — Z5111 Encounter for antineoplastic chemotherapy: Secondary | ICD-10-CM

## 2020-09-27 DIAGNOSIS — F17210 Nicotine dependence, cigarettes, uncomplicated: Secondary | ICD-10-CM | POA: Insufficient documentation

## 2020-09-27 DIAGNOSIS — E46 Unspecified protein-calorie malnutrition: Secondary | ICD-10-CM

## 2020-09-27 DIAGNOSIS — Z515 Encounter for palliative care: Secondary | ICD-10-CM | POA: Diagnosis not present

## 2020-09-27 DIAGNOSIS — C7802 Secondary malignant neoplasm of left lung: Secondary | ICD-10-CM | POA: Insufficient documentation

## 2020-09-27 DIAGNOSIS — Z79899 Other long term (current) drug therapy: Secondary | ICD-10-CM | POA: Diagnosis not present

## 2020-09-27 DIAGNOSIS — C3411 Malignant neoplasm of upper lobe, right bronchus or lung: Secondary | ICD-10-CM | POA: Insufficient documentation

## 2020-09-27 DIAGNOSIS — Z7984 Long term (current) use of oral hypoglycemic drugs: Secondary | ICD-10-CM | POA: Insufficient documentation

## 2020-09-27 DIAGNOSIS — C3490 Malignant neoplasm of unspecified part of unspecified bronchus or lung: Secondary | ICD-10-CM | POA: Diagnosis not present

## 2020-09-27 DIAGNOSIS — R000 Tachycardia, unspecified: Secondary | ICD-10-CM | POA: Insufficient documentation

## 2020-09-27 DIAGNOSIS — C3480 Malignant neoplasm of overlapping sites of unspecified bronchus and lung: Secondary | ICD-10-CM

## 2020-09-27 DIAGNOSIS — R739 Hyperglycemia, unspecified: Secondary | ICD-10-CM

## 2020-09-27 DIAGNOSIS — K1231 Oral mucositis (ulcerative) due to antineoplastic therapy: Secondary | ICD-10-CM | POA: Insufficient documentation

## 2020-09-27 DIAGNOSIS — J449 Chronic obstructive pulmonary disease, unspecified: Secondary | ICD-10-CM | POA: Insufficient documentation

## 2020-09-27 DIAGNOSIS — Z794 Long term (current) use of insulin: Secondary | ICD-10-CM | POA: Insufficient documentation

## 2020-09-27 DIAGNOSIS — Z923 Personal history of irradiation: Secondary | ICD-10-CM | POA: Insufficient documentation

## 2020-09-27 DIAGNOSIS — E162 Hypoglycemia, unspecified: Secondary | ICD-10-CM

## 2020-09-27 DIAGNOSIS — R9431 Abnormal electrocardiogram [ECG] [EKG]: Secondary | ICD-10-CM | POA: Diagnosis not present

## 2020-09-27 LAB — COMPREHENSIVE METABOLIC PANEL
ALT: 17 U/L (ref 0–44)
ALT: 16 U/L (ref 0–44)
AST: 17 U/L (ref 15–41)
AST: 21 U/L (ref 15–41)
Albumin: 3.8 g/dL (ref 3.5–5.0)
Albumin: 3.6 g/dL (ref 3.5–5.0)
Alkaline Phosphatase: 87 U/L (ref 38–126)
Alkaline Phosphatase: 96 U/L (ref 38–126)
Anion gap: 15 (ref 5–15)
Anion gap: 14 (ref 5–15)
BUN: 12 mg/dL (ref 8–23)
BUN: 11 mg/dL (ref 8–23)
CO2: 26 mmol/L (ref 22–32)
CO2: 23 mmol/L (ref 22–32)
Calcium: 9.1 mg/dL (ref 8.9–10.3)
Calcium: 8.8 mg/dL — ABNORMAL LOW (ref 8.9–10.3)
Chloride: 92 mmol/L — ABNORMAL LOW (ref 98–111)
Chloride: 94 mmol/L — ABNORMAL LOW (ref 98–111)
Creatinine, Ser: 0.7 mg/dL (ref 0.61–1.24)
Creatinine, Ser: 0.7 mg/dL (ref 0.61–1.24)
GFR, Estimated: >60 mL/min (ref >60)
GFR, Estimated: >60 mL/min (ref >60)
Glucose, Bld: 580 mg/dL (ref 70–99)
Glucose, Bld: 641 mg/dL (ref 70–99)
Potassium: 4.6 mmol/L (ref 3.5–5.1)
Potassium: 4.5 mmol/L (ref 3.5–5.1)
Sodium: 133 mmol/L — ABNORMAL LOW (ref 135–145)
Sodium: 131 mmol/L — ABNORMAL LOW (ref 135–145)
Total Bilirubin: 1.1 mg/dL (ref 0.3–1.2)
Total Bilirubin: 1 mg/dL (ref 0.3–1.2)
Total Protein: 6.9 g/dL (ref 6.5–8.1)
Total Protein: 6.6 g/dL (ref 6.5–8.1)

## 2020-09-27 LAB — URINALYSIS, COMPLETE (UACMP) WITH MICROSCOPIC
Bacteria, UA: NONE SEEN
Bilirubin Urine: NEGATIVE
Glucose, UA: >=500 mg/dL — AB
Hgb urine dipstick: NEGATIVE
Ketones, ur: 20 mg/dL — AB
Leukocytes,Ua: NEGATIVE
Nitrite: NEGATIVE
Protein, ur: NEGATIVE mg/dL
Specific Gravity, Urine: 1.035 — ABNORMAL HIGH (ref 1.005–1.030)
pH: 6 (ref 5.0–8.0)

## 2020-09-27 LAB — CBC WITH DIFFERENTIAL/PLATELET
Abs Immature Granulocytes: 0.02 10*3/uL (ref 0.00–0.07)
Basophils Absolute: 0 10*3/uL (ref 0.0–0.1)
Basophils Relative: 1 %
Eosinophils Absolute: 0 10*3/uL (ref 0.0–0.5)
Eosinophils Relative: 0 %
HCT: 43.8 % (ref 39.0–52.0)
Hemoglobin: 15.1 g/dL (ref 13.0–17.0)
Immature Granulocytes: 1 %
Lymphocytes Relative: 12 %
Lymphs Abs: 0.5 10*3/uL — ABNORMAL LOW (ref 0.7–4.0)
MCH: 33.6 pg (ref 26.0–34.0)
MCHC: 34.5 g/dL (ref 30.0–36.0)
MCV: 97.6 fL (ref 80.0–100.0)
Monocytes Absolute: 0.5 10*3/uL (ref 0.1–1.0)
Monocytes Relative: 12 %
Neutro Abs: 3 10*3/uL (ref 1.7–7.7)
Neutrophils Relative %: 74 %
Platelets: 230 10*3/uL (ref 150–400)
RBC: 4.49 MIL/uL (ref 4.22–5.81)
RDW: 13 % (ref 11.5–15.5)
WBC: 3.9 10*3/uL — ABNORMAL LOW (ref 4.0–10.5)
nRBC: 0 % (ref 0.0–0.2)

## 2020-09-27 LAB — CBC
HCT: 42 % (ref 39.0–52.0)
Hemoglobin: 14.5 g/dL (ref 13.0–17.0)
MCH: 34.1 pg — ABNORMAL HIGH (ref 26.0–34.0)
MCHC: 34.5 g/dL (ref 30.0–36.0)
MCV: 98.8 fL (ref 80.0–100.0)
Platelets: 221 10*3/uL (ref 150–400)
RBC: 4.25 MIL/uL (ref 4.22–5.81)
RDW: 13.1 % (ref 11.5–15.5)
WBC: 4.4 10*3/uL (ref 4.0–10.5)
nRBC: 0 % (ref 0.0–0.2)

## 2020-09-27 LAB — BLOOD GAS, VENOUS
Acid-Base Excess: 0.8 mmol/L (ref 0.0–2.0)
Bicarbonate: 26.6 mmol/L (ref 20.0–28.0)
O2 Saturation: 66.9 %
Patient temperature: 37
pCO2, Ven: 46 mmHg (ref 44.0–60.0)
pH, Ven: 7.37 (ref 7.250–7.430)
pO2, Ven: 36 mmHg (ref 32.0–45.0)

## 2020-09-27 LAB — BETA-HYDROXYBUTYRIC ACID: Beta-Hydroxybutyric Acid: 1.42 mmol/L — ABNORMAL HIGH (ref 0.05–0.27)

## 2020-09-27 LAB — CBG MONITORING, ED: Glucose-Capillary: 383 mg/dL — ABNORMAL HIGH (ref 70–99)

## 2020-09-27 MED ORDER — INSULIN ASPART 100 UNIT/ML ~~LOC~~ SOLN
10.0000 [IU] | Freq: Once | SUBCUTANEOUS | Status: AC
  Administered 2020-09-27: 10 [IU] via SUBCUTANEOUS
  Filled 2020-09-27: qty 1

## 2020-09-27 MED ORDER — MORPHINE SULFATE (CONCENTRATE) 10 MG /0.5 ML PO SOLN
10.0000 mg | Freq: Four times a day (QID) | ORAL | 0 refills | Status: DC | PRN
Start: 2020-09-27 — End: 2023-02-06

## 2020-09-27 MED ORDER — SODIUM CHLORIDE 0.9 % IV SOLN
1000.0000 mL | Freq: Once | INTRAVENOUS | Status: AC
  Administered 2020-09-27: 1000 mL via INTRAVENOUS

## 2020-09-27 NOTE — ED Notes (Signed)
First RN note:  Dr. York Cerise called to inform Colin Novak that this patient is being sent over for hyperglycemia with a CBG of 580.  Pt is also a lung cancer patient.

## 2020-09-27 NOTE — Progress Notes (Signed)
[facility]  [phone] [phone]   Name: Colin Novak Date: 09/27/2020 MRN: [id number]  DOB: 1945/12/02  Patient Care Team: Guadalupe Maple, MD as PCP - General (Family Medicine) Solum, Betsey Holiday, MD as Physician Assistant (Endocrinology) Kem Parkinson, MD (Ophthalmology) Telford Nab, RN as Registered Nurse Tamala Julian, Jonette Eva, NP as Nurse Practitioner Earlie Server, MD as Consulting Physician (Oncology) Joyceann Kruser, Kirt Boys, NP as Referring Physician (Hospice and Palliative Medicine)    REASON FOR CONSULTATION: Palliative Care consult requested for this 74 y.o. male with multiple medical problems including stage IV non-small cell lung cancer who initially refused chemotherapy/immunotherapy but received palliative XRT to the right upper lobe as mass is obstructing his bronchus.    PET scan on 08/02/2020 revealed disease progression in the lungs.  Patient was started on afatinib.  Patient was referred to palliative care to help address goals and manage ongoing symptoms.  SOCIAL HISTORY:     reports that he has been smoking cigarettes. He has been smoking about 1.00 pack per day. He quit smokeless tobacco use about 61 years ago.  His smokeless tobacco use included chew. He reports that he does not drink alcohol and does not use drugs.   Patient is a widower.  He lives at home alone with his dog.  He has a daughter who is involved in his care.  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  ADVANCE DIRECTIVES:  Not on file  CODE STATUS: DNR (DNR form signed on 09/01/2019 and again on 08/09/2020)  PAST MEDICAL HISTORY: Past Medical History:  Diagnosis Date  . Anxiety   . COPD (chronic obstructive pulmonary disease) ([place])   . Diabetes mellitus without complication ([place])    type 2  . GERD (gastroesophageal reflux disease)   . Hyperlipidemia     PAST SURGICAL HISTORY:  Past Surgical History:   Procedure Laterality Date  . APPENDECTOMY    . ENDOBRONCHIAL ULTRASOUND Bilateral 06/13/2019   Procedure: ENDOBRONCHIAL ULTRASOUND, BILATERAL;  Surgeon: [name], MD;  Location: [facility] ORS;  Service: Pulmonary;  Laterality: Bilateral;  . EYE SURGERY Bilateral    cataract    HEMATOLOGY/ONCOLOGY HISTORY:  Oncology History  Malignant neoplasm of lung ([place])  06/20/2019 Initial Diagnosis   Non-small cell lung cancer ([place])   01/16/2020 - 01/16/2020 Chemotherapy   The patient had dexamethasone (DECADRON) 4 MG tablet, 0 of 1 cycle, Start date: --, End date: -- palonosetron (ALOXI) injection 0.25 mg, 0.25 mg, Intravenous,  Once, 0 of 4 cycles PEMEtrexed (ALIMTA) 800 mg in sodium chloride 0.9 % 100 mL chemo infusion, 500 mg/m2, Intravenous,  Once, 0 of 4 cycles CARBOplatin (PARAPLATIN) in sodium chloride 0.9 % 100 mL chemo infusion, , Intravenous,  Once, 0 of 4 cycles fosaprepitant (EMEND) 150 mg in sodium chloride 0.9 % 145 mL IVPB, 150 mg, Intravenous,  Once, 0 of 4 cycles pembrolizumab (KEYTRUDA) 200 mg in sodium chloride 0.9 % 50 mL chemo infusion, 200 mg (original dose ), Intravenous, Once, 0 of 4 cycles Dose modification: 200 mg (Cycle 1, Reason: Other (see comments))  for chemotherapy treatment.    01/23/2020 - 01/23/2020 Chemotherapy   The patient had dexamethasone (DECADRON) 4 MG tablet, 1 of 1 cycle, Start date: 06/20/2019, End date: 01/16/2020 palonosetron (ALOXI) injection 0.25 mg, 0.25 mg, Intravenous,  Once, 0 of 4 cycles PEMEtrexed (ALIMTA) 775 mg in sodium chloride 0.9 % 100 mL chemo infusion, 500 mg/m2, Intravenous,  Once,  0 of 4 cycles CARBOplatin (PARAPLATIN) in sodium chloride 0.9 % 100 mL chemo infusion, , Intravenous,  Once, 0 of 4 cycles fosaprepitant (EMEND) 150 mg, dexamethasone (DECADRON) 12 mg in sodium chloride 0.9 % 145 mL IVPB, , Intravenous,  Once, 0 of 4 cycles  for chemotherapy treatment.    07/26/2020 -  Chemotherapy   The patient had [No matching  medication found in this treatment plan]  for chemotherapy treatment.      ALLERGIES:  is allergic to penicillins.  MEDICATIONS:  Current Outpatient Medications  Medication Sig Dispense Refill  . afatinib dimaleate (GILOTRIF) 20 MG tablet Take 1 tablet (20 mg total) by mouth daily. Take on an empty stomach 1hr before or 2hrs after meals. 30 tablet 2  . albuterol (PROAIR HFA) 108 (90 Base) MCG/ACT inhaler Inhale 1-2 puffs into the lungs every 6 (six) hours as needed for wheezing or shortness of breath (use as needed for chest congestion.). 1 g 3  . aspirin EC 81 MG tablet Take 81 mg by mouth daily.    . citalopram (CELEXA) 20 MG tablet Take 1 tablet (20 mg total) by mouth daily. 90 tablet 4  . Dextromethorphan-guaiFENesin (ROBITUSSIN DM PO) Take 5 mLs by mouth at bedtime.     . fludrocortisone (FLORINEF) 0.1 MG tablet Take 100 mcg by mouth daily.    Marland Kitchen glucose blood (ONE TOUCH ULTRA TEST) test strip 1 each by Other route 2 (two) times daily as needed for other. Dx: E11.9 100 each 12  . HUMALOG KWIKPEN 100 UNIT/ML KwikPen 10 Units. Sliding scale up to 12 units    . LANTUS SOLOSTAR 100 UNIT/ML Solostar Pen INJECT 10-20 UNITS INTO THE SKIN DAILY (Patient taking differently: Inject 14 Units into the skin daily. ) 5 pen 12  . loperamide (IMODIUM) 2 MG capsule Take 1 capsule (2 mg total) by mouth See admin instructions. Initial: 4 mg, followed by 2 mg after each loose stool; maximum: 16 mg/day (Patient not taking: Reported on 09/10/2020) 90 capsule 0  . megestrol (MEGACE) 400 MG/10ML suspension Take 10 mLs (400 mg total) by mouth daily. (Patient not taking: Reported on 09/27/2020) 240 mL 0  . metFORMIN (GLUCOPHAGE) 500 MG tablet TAKE ONE TABLET BY MOUTH TWICE DAILY (Patient taking differently: Take 1,000 mg by mouth daily with supper. ) 60 tablet 12  . Morphine Sulfate (MORPHINE CONCENTRATE) 10 mg / 0.5 ml concentrated solution Take 0.5 mLs (10 mg total) by mouth every 6 (six) hours as needed for  moderate pain or severe pain. 30 mL 0  . Multiple Vitamin (MULTIVITAMIN) tablet Take 1 tablet by mouth daily.     Marland Kitchen NOVOFINE 32G X 6 MM MISC USE AS DIRECTED. 5 INJECTIONS A DAY 900 each 0   No current facility-administered medications for this visit.    VITAL SIGNS: There were no vitals taken for this visit. There were no vitals filed for this visit.  Estimated body mass index is 17.03 kg/m as calculated from the following:   Height as of 02/03/20: 5\' 6"  (1.676 m).   Weight as of an earlier encounter on 09/27/20: 105 lb 8 oz (47.9 kg).  LABS: CBC:    Component Value Date/Time   WBC 3.9 (L) 09/27/2020 0954   HGB 15.1 09/27/2020 0954   HGB 13.2 12/06/2018 0857   HCT 43.8 09/27/2020 0954   HCT 38.6 12/06/2018 0857   PLT 230 09/27/2020 0954   PLT 266 12/06/2018 0857   MCV 97.6 09/27/2020 0954   MCV  96 12/06/2018 0857   NEUTROABS 3.0 09/27/2020 0954   NEUTROABS 1.7 12/06/2018 0857   LYMPHSABS 0.5 (L) 09/27/2020 0954   LYMPHSABS 1.6 12/06/2018 0857   MONOABS 0.5 09/27/2020 0954   EOSABS 0.0 09/27/2020 0954   EOSABS 0.1 12/06/2018 0857   BASOSABS 0.0 09/27/2020 0954   BASOSABS 0.1 12/06/2018 0857   Comprehensive Metabolic Panel:    Component Value Date/Time   NA 133 (L) 09/27/2020 0954   NA 140 12/06/2018 0857   K 4.6 09/27/2020 0954   CL 92 (L) 09/27/2020 0954   CO2 26 09/27/2020 0954   BUN 12 09/27/2020 0954   BUN 9 12/06/2018 0857   CREATININE 0.70 09/27/2020 0954   GLUCOSE 580 (HH) 09/27/2020 0954   CALCIUM 9.1 09/27/2020 0954   AST 17 09/27/2020 0954   AST 55 (H) 04/01/2017 0907   ALT 17 09/27/2020 0954   ALT 48 (H) 04/01/2017 0907   ALKPHOS 87 09/27/2020 0954   BILITOT 1.1 09/27/2020 0954   BILITOT <0.2 12/06/2018 0857   PROT 6.9 09/27/2020 0954   PROT 6.4 12/06/2018 0857   ALBUMIN 3.8 09/27/2020 0954   ALBUMIN 4.1 12/06/2018 0857    RADIOGRAPHIC STUDIES: No results found.  PERFORMANCE STATUS (ECOG) : 1 - Symptomatic but completely ambulatory  Review  of Systems Unless otherwise noted, a complete review of systems is negative.  Physical Exam General: NAD, frail appearing, thin Pulmonary: Unlabored Extremities: no edema Skin: no rashes Neurological: Weakness but otherwise nonfocal  IMPRESSION: Routine follow-up visit.    Patient reports that he feels like he is doing okay overall.  He denied any significant symptomatic complaints today although I note that he is continued to have mucositis from the afatinib.  Patient has had fairly persistent hyperglycemia and was noted to have a serum glucose of 580 today and was therefore sent to the ER following his visit with Dr. Tasia Catchings.  It appears that patient has been somewhat noncompliant with taking his insulin per home PC notes.  Steroids were appropriately discontinued.  However, his weight has down trended some likely as a result to reduced appetite stimulation.  Cancer treatment plan is currently unclear.  Discussed with Dr. Tasia Catchings.  PLAN: -Continue current scope of treatment -RTC next week   Patient expressed understanding and was in agreement with this plan. He also understands that He can call the clinic at any time with any questions, concerns, or complaints.     Time Total: 15 minutes  Visit consisted of counseling and education dealing with the complex and emotionally intense issues of symptom management and palliative care in the setting of serious and potentially life-threatening illness.Greater than 50%  of this time was spent counseling and coordinating care related to the above assessment and plan.  Signed by: Altha Harm, PhD, NP-C

## 2020-09-27 NOTE — Progress Notes (Signed)
Hematology/Oncology follow up note [facility] Telephone:([phone] [phone]   Patient Care Team: Guadalupe Maple, MD as PCP - General (Family Medicine) Solum, Betsey Holiday, MD as Physician Assistant (Endocrinology) Kem Parkinson, MD (Ophthalmology) Telford Nab, RN as Registered Nurse Tamala Julian, Jonette Eva, NP as Nurse Practitioner Earlie Server, MD as Consulting Physician (Oncology) Borders, Kirt Boys, NP as Referring Physician (Hospice and Palliative Medicine)  REFERRING PROVIDER: Guadalupe Maple, MD  CHIEF COMPLAINTS/REASON FOR VISIT:  Follow up for non-small cell lung cancer  HISTORY OF PRESENTING ILLNESS:  Colin Novak is a  74 y.o.  male with PMH listed below was seen in consultation at the request of  Guadalupe Maple, MD  for evaluation of lung mass Patient reports ongoing shortness of breath, cough for  few months. He had CT chest scan done on 06/01/2019 which showed a concerning feature of 9.1 x 5.1 x 3.6 cm right upper lobe mass extending into mediastinum, encasing and occluding right upper lobe bronchus and some of the right upper lobe bronchial branches.  There is also significant narrowing of FVC. He also had a similar appearance of left upper lobe mass without mediastinal extension suspicious for a synchronized primary lung cancer Multiple additional smaller bilateral lung nodules, left greater than right. Mildly enlarged right hilar and mediastinal lymph nodes suspicious for metastatic lymphadenopathy. 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 Daughter [name] is his power of attorney. Denies fever or chills.  # patient was seen by me on 06/20/2019.  At that time, patient has 2 separate lung tumor.  Possibility of stage IV lung adenocarcinoma TX N1 M1 discussed with patient given that he has tumor in the contralateral lobe.  Systemic  chemotherapy with immunotherapy was recommended.  Patient declined at that time. He may have 2 separate primary lung cancer, if that is the case, he has stage III lung cancer.  would benefit from definitive concurrent chemoradiation followed by immunotherapy for maintenance. Patient has decided at that time that he does not want any systemic chemotherapy.   08/05/2019 He finished radiation to right upper lobe lesion.  09/16/2019 s/p radiation boost  01/06/2020 CT scan showed interval increase of the right para mediastinal and hilar mass and also multiple areas of increased density in the left lower lobe consistent with multifocal adenocarcinoma/metastasis.  #01/23/2020, PET scan showed a decreased left upper lobe and right upper lobe lesion. There is still occlusion of the right upper lobe bronchus with some associated atelectasis.Enlarged left lung nodules have not had increased metabolic activity and remained well below blood pool.New sclerosis in the upper sternal body comparing to previous PET scan. -Patient was not interested in any systemic treatment.  Declined immunotherapy maintenance.  NGS was sent.  he follows up with palliative care service.  # 08/02/2020 PET scan images were reviewed  No recurrent hypermetabolism at the site of masslike fibrosis in paramediastinal right upper lobe  Mildly hypermetabolic left upper lobe 4.7QQ mass, not changed since last PET 6 months ago,  Numerous (>10) irregular pulmonary nodules scattered through out bilateral lungs,increased or new from last study. Non has significant FDG uptake, compatible with progression of metastatic disease. No recurrent hypermetabolism at the site of masslike fibrosis in the right upper lobe. No recurrent hypermetabolic thoracic Adenopathy. No hypermetabolic metastatic disease in the abdomen, pelvis or Skeleton  INTERVAL HISTORY Colin Novak is a 74 y.o. male who has  above history reviewed by me today presents for follow up visit  for management of  non-small cell lung cancer. Problems and complaints are listed below: 07/31/2020-08/29/2020  Afatinib 49m daily- Stopped due to mucositis which affected his eating.  09/09/2020 restarted on Afatinib 272mdaily.  Patient reports that he took for about 10 days of afatinib 20 mg and he stopped it due to mucositis.  He used viscous lidocaine without long-lasting symptom relief. Patient does not wear dentures.  He chews with his gum He has lost weight since last visit.  Review of Systems  Constitutional: Positive for fatigue. Negative for appetite change, chills, diaphoresis, fever and unexpected weight change.  HENT:   Negative for hearing loss, lump/mass, nosebleeds, sore throat and voice change.        Mucositis  Eyes: Negative for eye problems and icterus.  Respiratory: Negative for chest tightness, cough, hemoptysis, shortness of breath and wheezing.   Cardiovascular: Negative for chest pain and leg swelling.  Gastrointestinal: Negative for abdominal distention, abdominal pain, blood in stool, diarrhea, nausea and rectal pain.  Endocrine: Negative for hot flashes.  Genitourinary: Negative for bladder incontinence, difficulty urinating, dysuria, frequency, hematuria and nocturia.   Musculoskeletal: Positive for back pain. Negative for arthralgias, flank pain, gait problem and myalgias.  Skin: Negative for itching and rash.  Neurological: Negative for dizziness, gait problem, headaches, light-headedness, numbness and seizures.  Hematological: Negative for adenopathy. Does not bruise/bleed easily.  Psychiatric/Behavioral: Negative for confusion and decreased concentration. The patient is not nervous/anxious.     MEDICAL HISTORY:  Past Medical History:  Diagnosis Date  . Anxiety   . COPD (chronic obstructive pulmonary disease) ([facility]  . Diabetes mellitus without complication ([place]   type 2  . GERD (gastroesophageal reflux disease)   . Hyperlipidemia     SURGICAL  HISTORY: Past Surgical History:  Procedure Laterality Date  . APPENDECTOMY    . ENDOBRONCHIAL ULTRASOUND Bilateral 06/13/2019   Procedure: ENDOBRONCHIAL ULTRASOUND, BILATERAL;  Surgeon: RaLaverle HobbyMD;  Location: [facility] ORS;  Service: Pulmonary;  Laterality: Bilateral;  . EYE SURGERY Bilateral    cataract    SOCIAL HISTORY: Social History   Socioeconomic History  . Marital status: Widowed    Spouse name: Not on file  . Number of children: 1  . Years of education: Not on file  . Highest education level: High school graduate  Occupational History  . Occupation: retired  Tobacco Use  . Smoking status: Current Every Day Smoker    Packs/day: 1.00    Types: Cigarettes  . Smokeless tobacco: Former [employer] developer  Types: Chew    Quit date: 06/10/1959  . Tobacco comment: 1 pack daily- 06/09/2019  Vaping Use  . Vaping Use: Never used  Substance and Sexual Activity  . Alcohol use: No  . Drug use: No  . Sexual activity: Not Currently  Other Topics Concern  . Not on file  Social History Narrative  . Not on file   Social Determinants of Health   Financial Resource Strain:   . Difficulty of Paying Living Expenses: Not on file  Food Insecurity:   . Worried About RuCharity fundraisern the Last Year: Not on file  . Ran Out of Food in the Last Year: Not on file  Transportation Needs:   . Lack of Transportation (Medical): Not on file  . Lack of Transportation (Non-Medical): Not on file  Physical Activity:   . Days of Exercise per Week: Not on file  .  Minutes of Exercise per Session: Not on file  Stress:   . Feeling of Stress : Not on file  Social Connections:   . Frequency of Communication with Friends and Family: Not on file  . Frequency of Social Gatherings with Friends and Family: Not on file  . Attends Religious Services: Not on file  . Active Member of Clubs or Organizations: Not on file  . Attends Archivist Meetings: Not on file  . Marital Status: Not on file   Intimate Partner Violence:   . Fear of Current or Ex-Partner: Not on file  . Emotionally Abused: Not on file  . Physically Abused: Not on file  . Sexually Abused: Not on file    FAMILY HISTORY: Family History  Problem Relation Age of Onset  . Cancer Mother        lung (non smoker)  . Diabetes Father   . Hypertension Father   . Lymphoma Brother   . Prostate cancer Brother     ALLERGIES:  is allergic to penicillins.  MEDICATIONS:  Current Outpatient Medications  Medication Sig Dispense Refill  . afatinib dimaleate (GILOTRIF) 20 MG tablet Take 1 tablet (20 mg total) by mouth daily. Take on an empty stomach 1hr before or 2hrs after meals. 30 tablet 2  . albuterol (PROAIR HFA) 108 (90 Base) MCG/ACT inhaler Inhale 1-2 puffs into the lungs every 6 (six) hours as needed for wheezing or shortness of breath (use as needed for chest congestion.). 1 g 3  . aspirin EC 81 MG tablet Take 81 mg by mouth daily.    . citalopram (CELEXA) 20 MG tablet Take 1 tablet (20 mg total) by mouth daily. 90 tablet 4  . Dextromethorphan-guaiFENesin (ROBITUSSIN DM PO) Take 5 mLs by mouth at bedtime.     . fludrocortisone (FLORINEF) 0.1 MG tablet Take 100 mcg by mouth daily.    Marland Kitchen glucose blood (ONE TOUCH ULTRA TEST) test strip 1 each by Other route 2 (two) times daily as needed for other. Dx: E11.9 100 each 12  . HUMALOG KWIKPEN 100 UNIT/ML KwikPen 10 Units. Sliding scale up to 12 units    . LANTUS SOLOSTAR 100 UNIT/ML Solostar Pen INJECT 10-20 UNITS INTO THE SKIN DAILY (Patient taking differently: Inject 14 Units into the skin daily. ) 5 pen 12  . metFORMIN (GLUCOPHAGE) 500 MG tablet TAKE ONE TABLET BY MOUTH TWICE DAILY (Patient taking differently: Take 1,000 mg by mouth daily with supper. ) 60 tablet 12  . Multiple Vitamin (MULTIVITAMIN) tablet Take 1 tablet by mouth daily.     Marland Kitchen NOVOFINE 32G X 6 MM MISC USE AS DIRECTED. 5 INJECTIONS A DAY 900 each 0  . loperamide (IMODIUM) 2 MG capsule Take 1 capsule (2 mg  total) by mouth See admin instructions. Initial: 4 mg, followed by 2 mg after each loose stool; maximum: 16 mg/day (Patient not taking: Reported on 09/10/2020) 90 capsule 0  . megestrol (MEGACE) 400 MG/10ML suspension Take 10 mLs (400 mg total) by mouth daily. (Patient not taking: Reported on 09/27/2020) 240 mL 0  . Morphine Sulfate (MORPHINE CONCENTRATE) 10 mg / 0.5 ml concentrated solution Take 0.5 mLs (10 mg total) by mouth every 6 (six) hours as needed for moderate pain or severe pain. 30 mL 0   No current facility-administered medications for this visit.     PHYSICAL EXAMINATION: ECOG PERFORMANCE STATUS: 2 - Symptomatic, <50% confined to bed Vitals:   09/27/20 1018  BP: (!) 90/57  Pulse: 98  Resp: 16  Temp: (!) 96 F (35.6 C)  SpO2: 94%   Filed Weights   09/27/20 1018  Weight: 105 lb 8 oz (47.9 kg)    Physical Exam Constitutional:      General: He is not in acute distress.    Appearance: He is ill-appearing.     Comments: Cachectic  HENT:     Head: Normocephalic and atraumatic.  Eyes:     General: No scleral icterus.    Pupils: Pupils are equal, round, and reactive to light.  Cardiovascular:     Rate and Rhythm: Normal rate and regular rhythm.     Heart sounds: Normal heart sounds.  Pulmonary:     Effort: Pulmonary effort is normal. No respiratory distress.     Breath sounds: No wheezing.     Comments: Decreased breath sounds bilaterally.  Abdominal:     General: Bowel sounds are normal. There is no distension.     Palpations: Abdomen is soft. There is no mass.  Musculoskeletal:        General: No deformity. Normal range of motion.     Cervical back: Normal range of motion and neck supple.     Comments: Clubbing  Skin:    General: Skin is warm and dry.     Findings: No erythema or rash.  Neurological:     General: No focal deficit present.     Mental Status: He is alert and oriented to person, place, and time. Mental status is at baseline.     Cranial Nerves:  No cranial nerve deficit.     Coordination: Coordination normal.  Psychiatric:        Mood and Affect: Mood normal.     LABORATORY DATA:  I have reviewed the data as listed Lab Results  Component Value Date   WBC 4.4 09/27/2020   HGB 14.5 09/27/2020   HCT 42.0 09/27/2020   MCV 98.8 09/27/2020   PLT 221 09/27/2020   Recent Labs    01/16/20 0942 07/24/20 1104 07/26/20 1001 07/31/20 1059 09/10/20 1256 09/27/20 0954 09/27/20 1235  NA 132*  --  130*   < > 132* 133* 131*  K 4.9  --  4.4   < > 3.2* 4.6 4.5  CL 98  --  92*   < > 97* 92* 94*  CO2 24  --  26   < > _0 GLUCOSE 460*  --  395*   < > 341* 580* 641*  BUN 13  --  15   < > _1 CREATININE 0.80   < > 0.77   < > 0.62 0.70 0.70  CALCIUM 9.3  --  9.0   < > 8.3* 9.1 8.8*  GFRNONAA >60  --  >60   < > >60 >60 >60  GFRAA >60  --  >60  --   --   --   --   PROT 7.7  --  7.1   < > 6.3* 6.9 6.6  ALBUMIN 4.4  --  4.0   < > 3.1* 3.8 3.6  AST 30  --  15   < > _2 ALT 33  --  12   < > _3 ALKPHOS 110  --  93   < > 73 87 96  BILITOT 0.9  --  1.0   < > 0.5 1.1 1.0   < > = values in this interval not displayed.  Iron/TIBC/Ferritin/ %Sat No results found for: IRON, TIBC, FERRITIN, IRONPCTSAT    RADIOGRAPHIC STUDIES: I have personally reviewed the radiological images as listed and agreed with the findings in the report.  DG Chest Port 1 View  Result Date: 09/27/2020 CLINICAL DATA:  Weakness, hyperglycemia. EXAM: PORTABLE CHEST 1 VIEW COMPARISON:  June 13, 2019.  July 24, 2020.  August 02, 2020. FINDINGS: Normal cardiac size. Stable masslike density seen in right paratracheal region consistent with fibrosis as noted on prior PET scan. No pneumothorax or pleural effusion is noted. Hyperexpansion of the lungs is noted with probable emphysematous disease. No acute pulmonary disease is noted. Bony thorax is unremarkable. IMPRESSION: Stable masslike density seen in right paratracheal region consistent with  fibrosis as noted on prior PET scan. Hyperexpansion of the lungs is noted with probable emphysematous disease. Aortic Atherosclerosis (ICD10-I70.0). Electronically Signed   By: Marijo Conception M.D.   On: 09/27/2020 12:57      ASSESSMENT & PLAN:  1. Malignant neoplasm of overlapping sites of lung, unspecified laterality ([place])   2. Protein-calorie malnutrition, unspecified severity ([place])   3. NSCLC with EGFR mutation ([place])   4. Encounter for antineoplastic chemotherapy   5. 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  NGS EGFR G719S+, [id number], PD-L1 30% Labs reviewed and discussed with patient. Off afatinib due to mucositis.  I discussed with him about options  -Try liquid morphine 10 mg every 4-6 hours as needed.  If pain improves, resume afatanib. 20 mg daily. -Or switch to other TKI's -Immunotherapy +/- chemotherapy.  Patient is willing to try liquid morphine and see if that will help him to stay on afatinib.  #Hypokalemia,   resolved #Malnutition Weight loss, continue nutrition couple months.  Patient declines referral to nutritionist.  #Hypotension, normal cortisol level.  Chronic.  Monitor. #Hyperglycemia, patient has longstanding history of uncontrolled diabetes.  Today glucose level is 580.  I recommend patient to go to emergency room for further evaluation.  He agrees with the plan.  I called the triage nurse in ER. Follow-up in 1 week All questions were answered. The patient knows to call the clinic with any problems questions or concerns.  Earlie Server, MD, PhD Hematology Oncology [facility] Pager- [phone] 09/27/2020

## 2020-09-27 NOTE — ED Provider Notes (Signed)
[facility] Emergency Department Provider Note   ____________________________________________    I have reviewed the triage vital signs and the nursing notes.   HISTORY  Chief Complaint Hyperglycemia     HPI Colin Novak is a 74 y.o. male with history of type 2 diabetes who presents with elevated glucose.  Patient reports his meter read as high this morning.  He reports overall he has been feeling okay.  He does feel somewhat dehydrated.  Denies fevers chills cough shortness of breath.  No nausea or vomiting.  Reports compliance with his medications.  He does take Kenalog and Lantus.  No history of DKA.  Past Medical History:  Diagnosis Date  . Anxiety   . COPD (chronic obstructive pulmonary disease) ([place])   . Diabetes mellitus without complication ([place])    type 2  . GERD (gastroesophageal reflux disease)   . Hyperlipidemia     Patient Active Problem List   Diagnosis Date Noted  . Hypoglycemia 09/27/2020  . Encounter for antineoplastic chemotherapy 08/09/2020  . NSCLC with EGFR mutation ([place]) 08/09/2020  . Hypotension due to hypovolemia 07/26/2020  . Hyponatremia 07/26/2020  . Type 2 diabetes mellitus without complication, with long-term current use of insulin ([place]) 02/03/2020  . Malignant neoplasm of overlapping sites of lung ([place]) 01/16/2020  . Goals of care, counseling/discussion 06/20/2019  . Protein-calorie malnutrition ([place]) 06/20/2019  . Malignant neoplasm of lung ([place]) 06/20/2019  . Moderate protein-calorie malnutrition ([place]) 05/24/2018  . Tobacco dependence 05/24/2018  . Underweight 05/24/2018  . Weight loss, unintentional 05/24/2018  . Edema extremities 11/19/2017  . Depression, major, single episode, moderate ([place]) 11/19/2017  . Advanced care planning/counseling discussion 11/19/2017  . Senile purpura ([place]) 04/01/2017  . COPD (chronic obstructive pulmonary disease) ([place]) 07/24/2015  . Malnutrition ([place]) 07/24/2015  . Skin  lesion 07/24/2015  . BPH (benign prostatic hyperplasia) 07/24/2015  . Diabetes mellitus associated with hormonal etiology ([place]) 04/11/2015  . Hyperlipidemia 04/11/2015    Past Surgical History:  Procedure Laterality Date  . APPENDECTOMY    . ENDOBRONCHIAL ULTRASOUND Bilateral 06/13/2019   Procedure: ENDOBRONCHIAL ULTRASOUND, BILATERAL;  Surgeon: [name], MD;  Location: [facility] ORS;  Service: Pulmonary;  Laterality: Bilateral;  . EYE SURGERY Bilateral    cataract    Prior to Admission medications   Medication Sig Start Date End Date Taking? Authorizing Provider  afatinib dimaleate (GILOTRIF) 20 MG tablet Take 1 tablet (20 mg total) by mouth daily. Take on an empty stomach 1hr before or 2hrs after meals. 09/03/20   Earlie Server, MD  albuterol (PROAIR HFA) 108 [phone] Base) MCG/ACT inhaler Inhale 1-2 puffs into the lungs every 6 (six) hours as needed for wheezing or shortness of breath (use as needed for chest congestion.). 06/09/19   [name], MD  aspirin EC 81 MG tablet Take 81 mg by mouth daily.    [provider]  citalopram (CELEXA) 20 MG tablet Take 1 tablet (20 mg total) by mouth daily. 12/06/18   Guadalupe Maple, MD  Dextromethorphan-guaiFENesin (ROBITUSSIN DM PO) Take 5 mLs by mouth at bedtime.     [provider]  fludrocortisone (FLORINEF) 0.1 MG tablet Take 100 mcg by mouth daily. 08/24/20   [provider]  glucose blood (ONE TOUCH ULTRA TEST) test strip 1 each by Other route 2 (two) times daily as needed for other. Dx: E11.9 07/19/15   Guadalupe Maple, MD  HUMALOG KWIKPEN 100 UNIT/ML KwikPen 10 Units. Sliding scale up to 12 units  06/14/19   [provider]  LANTUS SOLOSTAR 100 UNIT/ML Solostar Pen INJECT 10-20 UNITS INTO THE SKIN DAILY Patient taking differently: Inject 14 Units into the skin daily.  04/27/17   Guadalupe Maple, MD  loperamide (IMODIUM) 2 MG capsule Take 1 capsule (2 mg total) by mouth See admin instructions.  Initial: 4 mg, followed by 2 mg after each loose stool; maximum: 16 mg/day Patient not taking: Reported on 09/10/2020 08/27/20   Earlie Server, MD  megestrol (MEGACE) 400 MG/10ML suspension Take 10 mLs (400 mg total) by mouth daily. Patient not taking: Reported on 09/27/2020 07/26/20   Earlie Server, MD  metFORMIN (GLUCOPHAGE) 500 MG tablet TAKE ONE TABLET BY MOUTH TWICE DAILY Patient taking differently: Take 1,000 mg by mouth daily with supper.  12/22/16   Guadalupe Maple, MD  Morphine Sulfate (MORPHINE CONCENTRATE) 10 mg / 0.5 ml concentrated solution Take 0.5 mLs (10 mg total) by mouth every 6 (six) hours as needed for moderate pain or severe pain. 09/27/20   Earlie Server, MD  Multiple Vitamin (MULTIVITAMIN) tablet Take 1 tablet by mouth daily.     [provider]  NOVOFINE 32G X 6 MM MISC USE AS DIRECTED. 5 INJECTIONS A DAY 05/05/16   Johnson, Megan P, DO  prochlorperazine (COMPAZINE) 10 MG tablet Take 1 tablet (10 mg total) by mouth every 6 (six) hours as needed (Nausea or vomiting). Patient not taking: Reported on 01/13/2020 06/20/19 01/16/20  Earlie Server, MD     Allergies Penicillins  Family History  Problem Relation Age of Onset  . Cancer Mother        lung (non smoker)  . Diabetes Father   . Hypertension Father   . Lymphoma Brother   . Prostate cancer Brother     Social History Social History   Tobacco Use  . Smoking status: Current Every Day Smoker    Packs/day: 1.00    Types: Cigarettes  . Smokeless tobacco: Former Systems developer    Types: Chew    Quit date: 06/10/1959  . Tobacco comment: 1 pack daily- 06/09/2019  Vaping Use  . Vaping Use: Never used  Substance Use Topics  . Alcohol use: No  . Drug use: No    Review of Systems  Constitutional: No fever/chills Eyes: No visual changes.  ENT: Thirsty Cardiovascular: Denies chest pain. Respiratory: Denies shortness of breath. Gastrointestinal: No abdominal pain.  Genitourinary: Negative for dysuria. Musculoskeletal: Negative for back  pain. Skin: Negative for rash. Neurological: Negative for headache   ____________________________________________   PHYSICAL EXAM:  VITAL SIGNS: ED Triage Vitals  Enc Vitals Group     BP 09/27/20 1228 (!) 84/62     Pulse Rate 09/27/20 1219 (!) 106     Resp 09/27/20 1219 20     Temp 09/27/20 1219 98.4 F (36.9 C)     Temp Source 09/27/20 1219 Oral     SpO2 09/27/20 1219 96 %     Weight 09/27/20 1225 47.6 kg (105 lb)     Height 09/27/20 1225 1.727 m (_0 )     Head Circumference --      Peak Flow --      Pain Score 09/27/20 1224 0     Pain Loc --      Pain Edu? --      Excl. in [place]? --     Constitutional: Alert and oriented.  Nose: No congestion/rhinnorhea. Mouth/Throat: Mucous membranes are moist.   Neck:  Painless ROM Cardiovascular: Mild tachycardia, regular  rhythm. Grossly normal heart sounds.  Good peripheral circulation. Respiratory: Normal respiratory effort.  No retractions. Lungs CTAB. Gastrointestinal: Soft and nontender. No distention.  No CVA tenderness.  Musculoskeletal: No lower extremity tenderness nor edema.  Warm and well perfused Neurologic:  Normal speech and language. No gross focal neurologic deficits are appreciated.  Skin:  Skin is warm, dry and intact. No rash noted. Psychiatric: Mood and affect are normal. Speech and behavior are normal.  ____________________________________________   LABS (all labs ordered are listed, but only abnormal results are displayed)  Labs Reviewed  CBC - Abnormal; Notable for the following components:      Result Value   MCH 34.1 (*)    All other components within normal limits  URINALYSIS, COMPLETE (UACMP) WITH MICROSCOPIC - Abnormal; Notable for the following components:   Color, Urine YELLOW (*)    APPearance CLEAR (*)    Specific Gravity, Urine 1.035 (*)    Glucose, UA >=500 (*)    Ketones, ur 20 (*)    All other components within normal limits  COMPREHENSIVE METABOLIC PANEL - Abnormal; Notable for the  following components:   Sodium 131 (*)    Chloride 94 (*)    Glucose, Bld 641 (*)    Calcium 8.8 (*)    All other components within normal limits  CBG MONITORING, ED - Abnormal; Notable for the following components:   Glucose-Capillary 383 (*)    All other components within normal limits  BLOOD GAS, VENOUS  BETA-HYDROXYBUTYRIC ACID  CBG MONITORING, ED  CBG MONITORING, ED   ____________________________________________  EKG  ED ECG REPORT I, Lavonia Drafts, the attending physician, personally viewed and interpreted this ECG.  Date: 09/27/2020  Rhythm: normal sinus rhythm QRS Axis: normal Intervals: normal ST/T Wave abnormalities: Nonspecific changes Narrative Interpretation: no evidence of acute ischemia  ____________________________________________  RADIOLOGY  Chest x-ray reviewed by me, stable ____________________________________________   PROCEDURES  Procedure(s) performed: yes  .1-3 Lead EKG Interpretation Performed by: Lavonia Drafts, MD Authorized by: Lavonia Drafts, MD     Interpretation: normal     ECG rate assessment: normal     Rhythm: sinus rhythm     Ectopy: none     Conduction: normal       Critical Care performed: No ____________________________________________   INITIAL IMPRESSION / ASSESSMENT AND PLAN / ED COURSE  Pertinent labs & imaging results that were available during my care of the patient were reviewed by me and considered in my medical decision making (see chart for details).  Patient presents with elevated glucose.  Differential includes hyperglycemia, infection, DKA  Overall well-appearing I suspect hyperglycemia not DKA.  Pending labs including beta hydroxybutyric acid, VBG.  Will give IV fluids while we await chemistry results  CMP notes glucose of 641 but normal anion gap  VBG demonstrates pH of 7.37.  Not consistent with DKA.  We will give IV insulin 10 units and recheck glucose.  Glucose significantly improved,  patient is comfortable managing glucose at home, recommend increase p.o. hydration with water, avoid sugary foods and drinks. Outpatient follow-up with PCP    ____________________________________________   FINAL CLINICAL IMPRESSION(S) / ED DIAGNOSES  Final diagnoses:  Hyperglycemia        Note:  This document was prepared using Dragon voice recognition software and may include unintentional dictation errors.   Lavonia Drafts, MD 09/27/20 [phone]

## 2020-09-27 NOTE — ED Triage Notes (Addendum)
Pt here from home via POV with complaints of hyperglycemia. Pt states his cbg monitor at home read "high". Pt reports increased thirst and polyuria, denies any other symptoms at this time.

## 2020-09-27 NOTE — Progress Notes (Signed)
Pt here for follow up. No new concerns voiced.   

## 2020-10-05 ENCOUNTER — Inpatient Hospital Stay: Payer: Medicare Other

## 2020-10-05 ENCOUNTER — Inpatient Hospital Stay (HOSPITAL_BASED_OUTPATIENT_CLINIC_OR_DEPARTMENT_OTHER): Payer: Medicare Other | Admitting: Oncology

## 2020-10-05 ENCOUNTER — Inpatient Hospital Stay: Payer: Medicare Other | Admitting: Hospice and Palliative Medicine

## 2020-10-05 ENCOUNTER — Encounter: Payer: Self-pay | Admitting: Oncology

## 2020-10-05 VITALS — BP 125/76 | HR 104 | Temp 97.7°F | Resp 18 | Wt 109.9 lb

## 2020-10-05 DIAGNOSIS — E785 Hyperlipidemia, unspecified: Secondary | ICD-10-CM | POA: Diagnosis not present

## 2020-10-05 DIAGNOSIS — E1165 Type 2 diabetes mellitus with hyperglycemia: Secondary | ICD-10-CM | POA: Diagnosis not present

## 2020-10-05 DIAGNOSIS — J449 Chronic obstructive pulmonary disease, unspecified: Secondary | ICD-10-CM | POA: Diagnosis not present

## 2020-10-05 DIAGNOSIS — K1231 Oral mucositis (ulcerative) due to antineoplastic therapy: Secondary | ICD-10-CM | POA: Diagnosis not present

## 2020-10-05 DIAGNOSIS — Z79899 Other long term (current) drug therapy: Secondary | ICD-10-CM | POA: Diagnosis not present

## 2020-10-05 DIAGNOSIS — C3480 Malignant neoplasm of overlapping sites of unspecified bronchus and lung: Secondary | ICD-10-CM

## 2020-10-05 DIAGNOSIS — Z7189 Other specified counseling: Secondary | ICD-10-CM

## 2020-10-05 DIAGNOSIS — C3490 Malignant neoplasm of unspecified part of unspecified bronchus or lung: Secondary | ICD-10-CM

## 2020-10-05 DIAGNOSIS — E46 Unspecified protein-calorie malnutrition: Secondary | ICD-10-CM | POA: Diagnosis not present

## 2020-10-05 DIAGNOSIS — Z5111 Encounter for antineoplastic chemotherapy: Secondary | ICD-10-CM | POA: Diagnosis not present

## 2020-10-05 DIAGNOSIS — F17210 Nicotine dependence, cigarettes, uncomplicated: Secondary | ICD-10-CM | POA: Diagnosis not present

## 2020-10-05 DIAGNOSIS — K219 Gastro-esophageal reflux disease without esophagitis: Secondary | ICD-10-CM | POA: Diagnosis not present

## 2020-10-05 DIAGNOSIS — C7802 Secondary malignant neoplasm of left lung: Secondary | ICD-10-CM | POA: Diagnosis not present

## 2020-10-05 DIAGNOSIS — Z923 Personal history of irradiation: Secondary | ICD-10-CM | POA: Diagnosis not present

## 2020-10-05 DIAGNOSIS — Z794 Long term (current) use of insulin: Secondary | ICD-10-CM | POA: Diagnosis not present

## 2020-10-05 DIAGNOSIS — C3411 Malignant neoplasm of upper lobe, right bronchus or lung: Secondary | ICD-10-CM | POA: Diagnosis not present

## 2020-10-05 MED ORDER — OSIMERTINIB MESYLATE 40 MG PO TABS: 40.0000 mg | ORAL_TABLET | Freq: Every day | ORAL | 0 refills | Status: DC

## 2020-10-05 MED ORDER — SODIUM CHLORIDE 0.9 % IV SOLN
Freq: Once | INTRAVENOUS | Status: AC
  Filled 2020-10-05: qty 250

## 2020-10-05 NOTE — Progress Notes (Signed)
DISCONTINUE ON PATHWAY REGIMEN - Non-Small Cell Lung     Daily:     Afatinib   **Always confirm dose/schedule in your pharmacy ordering system**  REASON: Toxicities / Adverse Event PRIOR TREATMENT: [id number]: Afatinib 40 mg Daily Until Progression or Unacceptable Toxicity TREATMENT RESPONSE: Unable to Evaluate  START OFF PATHWAY REGIMEN - Non-Small Cell Lung   [id number]:Osimertinib 80 mg PO Daily D1-28 q28 Days:   A cycle is every 28 days:     Osimertinib   **Always confirm dose/schedule in your pharmacy ordering system**  Patient Characteristics: Stage IV Metastatic, Nonsquamous, Initial Molecular Targeted Therapy, EGFR Mutation -  Uncommon (S768I, L861Q, and G719X) Therapeutic Status: Stage IV Metastatic Histology: Nonsquamous Cell ROS1 Rearrangement Status: Negative Other Mutations/Biomarkers: No Other Actionable Mutations Chemotherapy/Immunotherapy LOT: Not Appropriate Molecular Targeted Therapy: Initial Molecular Targeted Therapy KRAS G12C Mutation Status: Negative MET Exon 14 Mutation Status: Awaiting Test Results RET Gene Fusion Status: Negative EGFR Mutation Status: Positive - Uncommon (S768I, L861Q, and G719X) NTRK Gene Fusion Status: Negative PD-L1 Expression Status: PD-L1 Positive 1-49% (TPS) ALK Rearrangement Status: Negative BRAF V600E Mutation Status: Negative Intent of Therapy: Non-Curative / Palliative Intent, Discussed with Patient

## 2020-10-05 NOTE — Progress Notes (Signed)
Hematology/Oncology follow up note [facility] Telephone:([phone] [phone]) [phone]   Patient Care Team: Guadalupe Maple, MD as PCP - General (Family Medicine) Solum, Betsey Holiday, MD as Physician Assistant (Endocrinology) Kem Parkinson, MD (Ophthalmology) Telford Nab, RN as Registered Nurse Tamala Julian, Jonette Eva, NP as Nurse Practitioner Earlie Server, MD as Consulting Physician (Oncology) Borders, Kirt Boys, NP as Referring Physician (Hospice and Palliative Medicine)  REFERRING PROVIDER: Guadalupe Maple, MD  CHIEF COMPLAINTS/REASON FOR VISIT:  Follow up for non-small cell lung cancer  HISTORY OF PRESENTING ILLNESS:  Colin Novak is a  74 y.o.  male with PMH listed below was seen in consultation at the request of  Guadalupe Maple, MD  for evaluation of lung mass Patient reports ongoing shortness of breath, cough for  few months. He had CT chest scan done on 06/01/2019 which showed a concerning feature of 9.1 x 5.1 x 3.6 cm right upper lobe mass extending into mediastinum, encasing and occluding right upper lobe bronchus and some of the right upper lobe bronchial branches.  There is also significant narrowing of FVC. He also had a similar appearance of left upper lobe mass without mediastinal extension suspicious for a synchronized primary lung cancer Multiple additional smaller bilateral lung nodules, left greater than right. Mildly enlarged right hilar and mediastinal lymph nodes suspicious for metastatic lymphadenopathy. 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 Daughter [name] is his power of attorney. Denies fever or chills.  # patient was seen by me on 06/20/2019.  At that time, patient has 2 separate lung tumor.  Possibility of stage IV lung adenocarcinoma TX N1 M1 discussed with patient given that he has tumor in the contralateral lobe.  Systemic  chemotherapy with immunotherapy was recommended.  Patient declined at that time. He may have 2 separate primary lung cancer, if that is the case, he has stage III lung cancer.  would benefit from definitive concurrent chemoradiation followed by immunotherapy for maintenance. Patient has decided at that time that he does not want any systemic chemotherapy.   08/05/2019 He finished radiation to right upper lobe lesion.  09/16/2019 s/p radiation boost  01/06/2020 CT scan showed interval increase of the right para mediastinal and hilar mass and also multiple areas of increased density in the left lower lobe consistent with multifocal adenocarcinoma/metastasis.  #01/23/2020, PET scan showed a decreased left upper lobe and right upper lobe lesion. There is still occlusion of the right upper lobe bronchus with some associated atelectasis.Enlarged left lung nodules have not had increased metabolic activity and remained well below blood pool.New sclerosis in the upper sternal body comparing to previous PET scan. -Patient was not interested in any systemic treatment.  Declined immunotherapy maintenance.  NGS was sent.  he follows up with palliative care service.  # 08/02/2020 PET scan images were reviewed  No recurrent hypermetabolism at the site of masslike fibrosis in paramediastinal right upper lobe  Mildly hypermetabolic left upper lobe 4.7QQ mass, not changed since last PET 6 months ago,  Numerous (>10) irregular pulmonary nodules scattered through out bilateral lungs,increased or new from last study. Non has significant FDG uptake, compatible with progression of metastatic disease. No recurrent hypermetabolism at the site of masslike fibrosis in the right upper lobe. No recurrent hypermetabolic thoracic Adenopathy. No hypermetabolic metastatic disease in the abdomen, pelvis or Skeleton  INTERVAL HISTORY Colin Novak is a 74 y.o. male who has  above history reviewed by me today presents for follow up visit  for management of  non-small cell lung cancer. Problems and complaints are listed below: 07/31/2020-08/29/2020  Afatinib $RemoveB'30mg'NfiQdwjY$  daily- Stopped due to mucositis which affected his eating.  09/09/2020 restarted on Afatinib $RemoveBef'20mg'LVUbrKnwov$  daily.  He took about 10 days and he stopped it due to mucositis. During his last visit, we discussed about utilizing oral liquid morphine and viscous lidocaine to help him to cope with mucositis and increased challenge with afatinib.  Patient agreed at that point and he informed me today that he has changed his mind and not willing to try afatinib again.  He also had high glucose level in the 600s, was sent by me to emergency room and was given insulin and discharge.  He is not compliant in following up with primary care provider.  Today he has no new complaints.  Mucositis has completely resolved.  Review of Systems  Constitutional: Positive for fatigue. Negative for appetite change, chills, diaphoresis, fever and unexpected weight change.  HENT:   Negative for hearing loss, lump/mass, nosebleeds, sore throat and voice change.        Mucositis  Eyes: Negative for eye problems and icterus.  Respiratory: Negative for chest tightness, cough, hemoptysis, shortness of breath and wheezing.   Cardiovascular: Negative for chest pain and leg swelling.  Gastrointestinal: Negative for abdominal distention, abdominal pain, blood in stool, diarrhea, nausea and rectal pain.  Endocrine: Negative for hot flashes.  Genitourinary: Negative for bladder incontinence, difficulty urinating, dysuria, frequency, hematuria and nocturia.   Musculoskeletal: Positive for back pain. Negative for arthralgias, flank pain, gait problem and myalgias.  Skin: Negative for itching and rash.  Neurological: Negative for dizziness, gait problem, headaches, light-headedness, numbness and seizures.  Hematological: Negative for adenopathy. Does not bruise/bleed easily.  Psychiatric/Behavioral: Negative for  confusion and decreased concentration. The patient is not nervous/anxious.     MEDICAL HISTORY:  Past Medical History:  Diagnosis Date  . Anxiety   . COPD (chronic obstructive pulmonary disease) ([place])   . Diabetes mellitus without complication ([place])    type 2  . GERD (gastroesophageal reflux disease)   . Hyperlipidemia     SURGICAL HISTORY: Past Surgical History:  Procedure Laterality Date  . APPENDECTOMY    . ENDOBRONCHIAL ULTRASOUND Bilateral 06/13/2019   Procedure: ENDOBRONCHIAL ULTRASOUND, BILATERAL;  Surgeon: [name], MD;  Location: [facility] ORS;  Service: Pulmonary;  Laterality: Bilateral;  . EYE SURGERY Bilateral    cataract    SOCIAL HISTORY: Social History   Socioeconomic History  . Marital status: Widowed    Spouse name: Not on file  . Number of children: 1  . Years of education: Not on file  . Highest education level: High school graduate  Occupational History  . Occupation: retired  Tobacco Use  . Smoking status: Current Every Day Smoker    Packs/day: 1.00    Types: Cigarettes  . Smokeless tobacco: Former Systems developer    Types: Chew    Quit date: 06/10/1959  . Tobacco comment: 1 pack daily- 06/09/2019  Vaping Use  . Vaping Use: Never used  Substance and Sexual Activity  . Alcohol use: No  . Drug use: No  . Sexual activity: Not Currently  Other Topics Concern  . Not on file  Social History Narrative  . Not on file   Social Determinants of Health   Financial Resource Strain: Not on file  Food Insecurity: Not on file  Transportation Needs: Not on file  Physical  Activity: Not on file  Stress: Not on file  Social Connections: Not on file  Intimate Partner Violence: Not on file    FAMILY HISTORY: Family History  Problem Relation Age of Onset  . Cancer Mother        lung (non smoker)  . Diabetes Father   . Hypertension Father   . Lymphoma Brother   . Prostate cancer Brother     ALLERGIES:  is allergic to penicillins.  MEDICATIONS:   Current Outpatient Medications  Medication Sig Dispense Refill  . afatinib dimaleate (GILOTRIF) 20 MG tablet Take 1 tablet (20 mg total) by mouth daily. Take on an empty stomach 1hr before or 2hrs after meals. 30 tablet 2  . albuterol (PROAIR HFA) 108 (90 Base) MCG/ACT inhaler Inhale 1-2 puffs into the lungs every 6 (six) hours as needed for wheezing or shortness of breath (use as needed for chest congestion.). 1 g 3  . aspirin EC 81 MG tablet Take 81 mg by mouth daily.    . citalopram (CELEXA) 20 MG tablet Take 1 tablet (20 mg total) by mouth daily. 90 tablet 4  . Dextromethorphan-guaiFENesin (ROBITUSSIN DM PO) Take 5 mLs by mouth at bedtime.     . fludrocortisone (FLORINEF) 0.1 MG tablet Take 100 mcg by mouth daily.    Marland Kitchen glucose blood (ONE TOUCH ULTRA TEST) test strip 1 each by Other route 2 (two) times daily as needed for other. Dx: E11.9 100 each 12  . HUMALOG KWIKPEN 100 UNIT/ML KwikPen 10 Units. Sliding scale up to 12 units    . LANTUS SOLOSTAR 100 UNIT/ML Solostar Pen INJECT 10-20 UNITS INTO THE SKIN DAILY (Patient taking differently: Inject 14 Units into the skin daily.) 5 pen 12  . metFORMIN (GLUCOPHAGE) 500 MG tablet TAKE ONE TABLET BY MOUTH TWICE DAILY (Patient taking differently: Take 1,000 mg by mouth daily with supper.) 60 tablet 12  . Morphine Sulfate (MORPHINE CONCENTRATE) 10 mg / 0.5 ml concentrated solution Take 0.5 mLs (10 mg total) by mouth every 6 (six) hours as needed for moderate pain or severe pain. 30 mL 0  . Multiple Vitamin (MULTIVITAMIN) tablet Take 1 tablet by mouth daily.     Marland Kitchen NOVOFINE 32G X 6 MM MISC USE AS DIRECTED. 5 INJECTIONS A DAY 900 each 0  . loperamide (IMODIUM) 2 MG capsule Take 1 capsule (2 mg total) by mouth See admin instructions. Initial: 4 mg, followed by 2 mg after each loose stool; maximum: 16 mg/day (Patient not taking: No sig reported) 90 capsule 0  . megestrol (MEGACE) 400 MG/10ML suspension Take 10 mLs (400 mg total) by mouth daily. (Patient not  taking: No sig reported) 240 mL 0   No current facility-administered medications for this visit.     PHYSICAL EXAMINATION: ECOG PERFORMANCE STATUS: 2 - Symptomatic, <50% confined to bed Vitals:   10/05/20 1055  BP: 125/76  Pulse: (!) 104  Resp: 18  Temp: 97.7 F (36.5 C)  SpO2: 99%   Filed Weights   10/05/20 1055  Weight: 109 lb 14.4 oz (49.9 kg)    Physical Exam Constitutional:      General: He is not in acute distress.    Appearance: He is not ill-appearing.     Comments: Frail  HENT:     Head: Normocephalic and atraumatic.  Eyes:     General: No scleral icterus.    Pupils: Pupils are equal, round, and reactive to light.  Cardiovascular:     Rate and Rhythm: Normal  rate and regular rhythm.     Heart sounds: Normal heart sounds.  Pulmonary:     Effort: Pulmonary effort is normal. No respiratory distress.     Breath sounds: No wheezing.     Comments: Decreased breath sounds bilaterally.  Abdominal:     General: Bowel sounds are normal. There is no distension.     Palpations: Abdomen is soft. There is no mass.  Musculoskeletal:        General: No deformity. Normal range of motion.     Cervical back: Normal range of motion and neck supple.     Comments: Clubbing  Skin:    General: Skin is warm and dry.     Findings: No erythema or rash.  Neurological:     Mental Status: He is alert and oriented to person, place, and time. Mental status is at baseline.     Cranial Nerves: No cranial nerve deficit.     Coordination: Coordination normal.  Psychiatric:        Mood and Affect: Mood normal.     LABORATORY DATA:  I have reviewed the data as listed Lab Results  Component Value Date   WBC 4.4 09/27/2020   HGB 14.5 09/27/2020   HCT 42.0 09/27/2020   MCV 98.8 09/27/2020   PLT 221 09/27/2020   Recent Labs    01/16/20 0942 07/24/20 1104 07/26/20 1001 07/31/20 1059 09/10/20 1256 09/27/20 0954 09/27/20 1235  NA 132*  --  130*   < > 132* 133* 131*  K 4.9   --  4.4   < > 3.2* 4.6 4.5  CL 98  --  92*   < > 97* 92* 94*  CO2 24  --  26   < > $R'27 26 23  'jz$ GLUCOSE 460*  --  395*   < > 341* 580* 641*  BUN 13  --  15   < > $R'14 12 11  'fA$ CREATININE 0.80   < > 0.77   < > 0.62 0.70 0.70  CALCIUM 9.3  --  9.0   < > 8.3* 9.1 8.8*  GFRNONAA >60  --  >60   < > >60 >60 >60  GFRAA >60  --  >60  --   --   --   --   PROT 7.7  --  7.1   < > 6.3* 6.9 6.6  ALBUMIN 4.4  --  4.0   < > 3.1* 3.8 3.6  AST 30  --  15   < > $R'21 17 21  'do$ ALT 33  --  12   < > $R'12 17 16  'Dk$ ALKPHOS 110  --  93   < > 73 87 96  BILITOT 0.9  --  1.0   < > 0.5 1.1 1.0   < > = values in this interval not displayed.   Iron/TIBC/Ferritin/ %Sat No results found for: IRON, TIBC, FERRITIN, IRONPCTSAT    RADIOGRAPHIC STUDIES: I have personally reviewed the radiological images as listed and agreed with the findings in the report.  DG Chest Port 1 View  Result Date: 09/27/2020 CLINICAL DATA:  Weakness, hyperglycemia. EXAM: PORTABLE CHEST 1 VIEW COMPARISON:  June 13, 2019.  July 24, 2020.  August 02, 2020. FINDINGS: Normal cardiac size. Stable masslike density seen in right paratracheal region consistent with fibrosis as noted on prior PET scan. No pneumothorax or pleural effusion is noted. Hyperexpansion of the lungs is noted with probable emphysematous disease. No acute pulmonary disease is noted. Bony  thorax is unremarkable. IMPRESSION: Stable masslike density seen in right paratracheal region consistent with fibrosis as noted on prior PET scan. Hyperexpansion of the lungs is noted with probable emphysematous disease. Aortic Atherosclerosis (ICD10-I70.0). Electronically Signed   By: Marijo Conception M.D.   On: 09/27/2020 12:57      ASSESSMENT & PLAN:  1. NSCLC with EGFR mutation ([place])   2. Encounter for antineoplastic chemotherapy   3. Goals of care, counseling/discussion   4. 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  NGS EGFR G719S+, [id number], PD-L1 30% Labs reviewed and discussed with patient.  I had a discussion with patient and his not willing to retry afatinib 20 mg daily.  He has never picked up oral liquid morphine.  I instructed RN to call pharmacy to discontinue the medication. I had a lengthy discussion with him about other options.  Switch to other oral chemotherapy or IV chemotherapy versus let his cancer take natural course and proceed with comfort care/hospice. Patient expresses his wish to try other treatment options. I will switch him to osimertinib. Renato Gails M.-J. An open-label, multicenter, phase II single arm trial of osimertinib in non-small cell lung cancer patients with uncommon EGFR mutation ([id number])] Prior to that, I will recommend patient to repeat CT for restaging.  Uncontrolled diabetes, encourage patient to make a follow-up appointment with primary care provider for glycemic control.  He agrees with the plan. Follow-up to be determined.  Awaiting insurance approval for osimertinib.  All questions were answered. The patient knows to call the clinic with any problems questions or concerns.  Earlie Server, MD, PhD Hematology Oncology [facility] Pager- [phone] 10/05/2020

## 2020-10-05 NOTE — Progress Notes (Signed)
Pt here for follow up. No new concerns voiced.   

## 2020-10-08 ENCOUNTER — Telehealth: Payer: Self-pay

## 2020-10-08 NOTE — Telephone Encounter (Signed)
Done CT has been sched as requested. Sched CT date: 10/22/20 @ 11am arrive by 1045 @ [facility] liquids only 4hrs prior

## 2020-10-08 NOTE — Telephone Encounter (Signed)
-----   Message from Earlie Server, MD sent at 10/05/2020  7:39 PM EST ----- Please schedule him to get a CT chest.  Ordered.  Thank you

## 2020-10-08 NOTE — Telephone Encounter (Signed)
Dr. Tasia Catchings would like to review CT first prior to scheduling follow up. CT moved up to 12/16 @ 2pm. Patient and daughter, [name], have been notified of appt and plan.

## 2020-10-08 NOTE — Telephone Encounter (Signed)
Pt's daughter, [name], called to ask when pt's next follow up will be. Can you give her a call once he is scheduled for follow up to inform her? Thanks!  CB# [phone]

## 2020-10-08 NOTE — Telephone Encounter (Signed)
Please schedule patient for a CT. I will call him and give him appt details.

## 2020-10-09 ENCOUNTER — Other Ambulatory Visit: Payer: Medicare Other | Admitting: Primary Care

## 2020-10-09 ENCOUNTER — Other Ambulatory Visit: Payer: Self-pay

## 2020-10-09 DIAGNOSIS — Z515 Encounter for palliative care: Secondary | ICD-10-CM | POA: Diagnosis not present

## 2020-10-09 DIAGNOSIS — Z794 Long term (current) use of insulin: Secondary | ICD-10-CM

## 2020-10-09 DIAGNOSIS — C3490 Malignant neoplasm of unspecified part of unspecified bronchus or lung: Secondary | ICD-10-CM

## 2020-10-09 DIAGNOSIS — E119 Type 2 diabetes mellitus without complications: Secondary | ICD-10-CM | POA: Diagnosis not present

## 2020-10-09 NOTE — Progress Notes (Signed)
[place] Consult Note Telephone: [phone]  Fax: [phone]     Date of encounter: 10/09/20 PATIENT NAME: Colin Novak [address] [place] [address] [phone] (home)  DOB: 02/10/1946 MRN: [id number]  PRIMARY CARE PROVIDER:    Venita Lick, NP [place] [address] [phone]   REFERRING PROVIDER:   Altha Harm NP  RESPONSIBLE PARTY:   Extended Emergency Contact Information Primary Emergency Contact: Chandra Batch Mobile Phone: [phone] Relation: Daughter Secondary Emergency Contact: [facility] Mobile Phone: [phone] Relation: Sister Interpreter needed? No  I met face to face with patient  in  home. Palliative Care was asked to follow this patient by consultation request of Billey Chang, NP to help address advance care planning and goals of care. This is a follow up visit.  ASSESSMENT AND RECOMMENDATIONS:   1. Advance Care Planning/Goals of Care: Goals include to maximize quality of life and symptom management. Our advance care planning conversation included a discussion about:     Exploration of personal, cultural or spiritual beliefs that might influence medical decisions   DNR on record and in home.  2. Symptom Management:   Hyperglycemia: Patient endorses FBS t 349 this am. He has eaten and not taken insulin. We discussed his ed trip with BG 600+. He endorses frequently forgetting his insulin or taking it after he eats. On a prior visit he was going to move his PM dose to earlier as he sometimes falls asleep and forgets to take it.   Recommend f/u with endocrine and PCP to manage. May benefit from an alarm or phone call from family  to cue taking insulin.  Stomatitis: Pt endorses stopping chemotherapeutic due to oral pain. He states this is healing now and he is eating better. States they may discuss another med/ lower dose when he sees oncology later.   Pain: States it is  tolerable and does not want to treat more aggressively. I recommend to monitor and be proactive if pain worsens due to progression of disease.  3. Follow up Palliative Care Visit: Palliative care will continue to follow for goals of care clarification and symptom management. Return 6-8 weeks or prn.  4. Family /Caregiver/Community Supports: Lives in own home alone with family nearby.  5. Cognitive / Functional decline: A and O x 3, (I) with many adls, needs assistance with iadls.  I spent 40 minutes providing this consultation,  from 0850 to 0930. More than 50% of the time in this consultation was spent coordinating communication.   CODE STATUS:DNR  PPS: 60%  HOSPICE ELIGIBILITY/DIAGNOSIS: yes with concordant goals of care.  Subjective:  CHIEF COMPLAINT: hyperglycemia  HISTORY OF PRESENT ILLNESS:  OMAREE Novak is a 74 y.o. year old male  with NSCLC, DM with uncontrolled hyperglycemia, pain. Has forgotten to take insulin on many occasions, and was in ED several weeks ago for weakness due to hyperglycemia. He has stopped chemo for his cancer, due to side effects.  We are asked to consult around advance care planning and complex medical decision making.    Review and summarization of old Epic records shows or history from other than patient  shows ED trip for hyperglycemia. Post hospital f/u in 3 weeks. Review or lab tests,  12/2- BG = 641, Na 131  History obtained from review of EMR, discussion with primary team, and  interview with family, caregiver  and/or Mr. Kleinfelter. Records reviewed and summarized above.   CURRENT  PROBLEM LIST:  Patient Active Problem List   Diagnosis Date Noted  . Hypoglycemia 09/27/2020  . Encounter for antineoplastic chemotherapy 08/09/2020  . NSCLC with EGFR mutation ([place]) 08/09/2020  . Hypotension due to hypovolemia 07/26/2020  . Hyponatremia 07/26/2020  . Type 2 diabetes mellitus without complication, with long-term current use of insulin ([place])  02/03/2020  . Malignant neoplasm of overlapping sites of lung ([place]) 01/16/2020  . Goals of care, counseling/discussion 06/20/2019  . Protein-calorie malnutrition ([place]) 06/20/2019  . Malignant neoplasm of lung ([place]) 06/20/2019  . Moderate protein-calorie malnutrition ([place]) 05/24/2018  . Tobacco dependence 05/24/2018  . Underweight 05/24/2018  . Weight loss, unintentional 05/24/2018  . Edema extremities 11/19/2017  . Depression, major, single episode, moderate ([place]) 11/19/2017  . Advanced care planning/counseling discussion 11/19/2017  . Senile purpura ([place]) 04/01/2017  . COPD (chronic obstructive pulmonary disease) ([place]) 07/24/2015  . Malnutrition ([place]) 07/24/2015  . Skin lesion 07/24/2015  . BPH (benign prostatic hyperplasia) 07/24/2015  . Diabetes mellitus associated with hormonal etiology ([place]) 04/11/2015  . Hyperlipidemia 04/11/2015   PAST MEDICAL HISTORY:  Active Ambulatory Problems    Diagnosis Date Noted  . Diabetes mellitus associated with hormonal etiology ([place]) 04/11/2015  . Hyperlipidemia 04/11/2015  . COPD (chronic obstructive pulmonary disease) ([place]) 07/24/2015  . Malnutrition ([place]) 07/24/2015  . Skin lesion 07/24/2015  . BPH (benign prostatic hyperplasia) 07/24/2015  . Senile purpura ([place]) 04/01/2017  . Edema extremities 11/19/2017  . Depression, major, single episode, moderate ([place]) 11/19/2017  . Advanced care planning/counseling discussion 11/19/2017  . Moderate protein-calorie malnutrition ([place]) 05/24/2018  . Tobacco dependence 05/24/2018  . Underweight 05/24/2018  . Weight loss, unintentional 05/24/2018  . Goals of care, counseling/discussion 06/20/2019  . Protein-calorie malnutrition ([place]) 06/20/2019  . Malignant neoplasm of lung ([place]) 06/20/2019  . Malignant neoplasm of overlapping sites of lung ([place]) 01/16/2020  . Type 2 diabetes mellitus without complication, with long-term current use of insulin ([place]) 02/03/2020  . Hypotension due to hypovolemia  07/26/2020  . Hyponatremia 07/26/2020  . Encounter for antineoplastic chemotherapy 08/09/2020  . NSCLC with EGFR mutation ([place]) 08/09/2020  . Hypoglycemia 09/27/2020   Resolved Ambulatory Problems    Diagnosis Date Noted  . Uncontrolled type 2 diabetes mellitus with hyperglycemia, with long-term current use of insulin ([place]) 01/29/2017   Past Medical History:  Diagnosis Date  . Anxiety   . Diabetes mellitus without complication ([place])   . GERD (gastroesophageal reflux disease)    SOCIAL HX:  Social History   Tobacco Use  . Smoking status: Current Every Day Smoker    Packs/day: 1.00    Types: Cigarettes  . Smokeless tobacco: Former Systems developer    Types: Chew    Quit date: 06/10/1959  . Tobacco comment: 1 pack daily- 06/09/2019  Substance Use Topics  . Alcohol use: No   FAMILY HX:  Family History  Problem Relation Age of Onset  . Cancer Mother        lung (non smoker)  . Diabetes Father   . Hypertension Father   . Lymphoma Brother   . Prostate cancer Brother       ALLERGIES:  Allergies  Allergen Reactions  . Penicillins Rash     PERTINENT MEDICATIONS:  Outpatient Encounter Medications as of 10/09/2020  Medication Sig  . albuterol (PROAIR HFA) 108 (90 Base) MCG/ACT inhaler Inhale 1-2 puffs into the lungs every 6 (six) hours as needed for wheezing or shortness of breath (use as needed for chest congestion.).  Marland Kitchen aspirin EC 81 MG  tablet Take 81 mg by mouth daily.  . citalopram (CELEXA) 20 MG tablet Take 1 tablet (20 mg total) by mouth daily.  Marland Kitchen Dextromethorphan-guaiFENesin (ROBITUSSIN DM PO) Take 5 mLs by mouth at bedtime.   . fludrocortisone (FLORINEF) 0.1 MG tablet Take 100 mcg by mouth daily.  Marland Kitchen glucose blood (ONE TOUCH ULTRA TEST) test strip 1 each by Other route 2 (two) times daily as needed for other. Dx: E11.9  . HUMALOG KWIKPEN 100 UNIT/ML KwikPen 10 Units. Sliding scale up to 12 units  . LANTUS SOLOSTAR 100 UNIT/ML Solostar Pen INJECT 10-20 UNITS INTO THE SKIN DAILY  (Patient taking differently: Inject 14 Units into the skin daily.)  . loperamide (IMODIUM) 2 MG capsule Take 1 capsule (2 mg total) by mouth See admin instructions. Initial: 4 mg, followed by 2 mg after each loose stool; maximum: 16 mg/day (Patient not taking: No sig reported)  . megestrol (MEGACE) 400 MG/10ML suspension Take 10 mLs (400 mg total) by mouth daily. (Patient not taking: No sig reported)  . metFORMIN (GLUCOPHAGE) 500 MG tablet TAKE ONE TABLET BY MOUTH TWICE DAILY (Patient taking differently: Take 1,000 mg by mouth daily with supper.)  . Morphine Sulfate (MORPHINE CONCENTRATE) 10 mg / 0.5 ml concentrated solution Take 0.5 mLs (10 mg total) by mouth every 6 (six) hours as needed for moderate pain or severe pain.  . Multiple Vitamin (MULTIVITAMIN) tablet Take 1 tablet by mouth daily.   Marland Kitchen NOVOFINE 32G X 6 MM MISC USE AS DIRECTED. 5 INJECTIONS A DAY  . osimertinib mesylate (TAGRISSO) 40 MG tablet Take 1 tablet (40 mg total) by mouth daily.  . [DISCONTINUED] prochlorperazine (COMPAZINE) 10 MG tablet Take 1 tablet (10 mg total) by mouth every 6 (six) hours as needed (Nausea or vomiting). (Patient not taking: Reported on 01/13/2020)   No facility-administered encounter medications on file as of 10/09/2020.    Objective: ROS  General: NAD EYES: denies vision changes, wears glasses ENMT: denies dysphagia Cardiovascular: denies chest pain Pulmonary: endorses cough, denies increased SOB Abdomen: endorses fair appetite,endorses continence of bowel GU: denies dysuria, endorses continence of urine, endorses nocturia and frequency at hs MSK:  endorses ROM limitations, no falls reported Skin: denies rashes or wounds Neurological:  Endorses pain, denies insomnia Psych: Endorses positive mood   Physical Exam: Current and past weights:  105 lbs, 117 lbs 6 months ago, 10% weight loss  Constitutional: 86/48, denies sx, HR 68 RR 20,BG 349 this am General: frail appearing, thin EYES: anicteric  sclera,lids intact, no discharge  ENMT:slight hard of  hearing,oral mucous membranes moist, edentulous CV: S1S2, RRR, no LE edema Pulmonary: rhonchi throughout all fields,  no increased work of breathing, ++ cough, room air, daily smoker Abdomen: intake 50%, no ascites MSK: severe sarcopenia, decreased ROM in all extremities,  Ambulatory (I) Skin: warm and dry, no rashes or wounds on visible skin Neuro: Generalized weakness, no cognitive impairment, grossly non -focal Psych: non-anxious affect, A and O x 3 Hem/lymph/immuno: no widespread bruising   Thank you for the opportunity to participate in the care of Mr. Taves.  The palliative care team will continue to follow. Please call our office at [phone] if we can be of additional assistance.  Jason Coop, NP , DNP, MPH, AGPCNP-BC, ACHPN  COVID-19 PATIENT SCREENING TOOL  Person answering questions: ____________self______ _____   1.  Is the patient or any family member in the home showing any signs or symptoms regarding respiratory infection?  Person with Symptom- __________NA_________________  a. Fever                                                                          Yes___ No___          ___________________  b. Shortness of breath                                                    Yes___ No___          ___________________ c. Cough/congestion                                       Yes___  No___         ___________________ d. Body aches/pains                                                         Yes___ No___        ____________________ e. Gastrointestinal symptoms (diarrhea, nausea)           Yes___ No___        ____________________  2. Within the past 14 days, has anyone living in the home had any contact with someone with or under investigation for COVID-19?    Yes___ No_X_   Person __________________   

## 2020-10-11 ENCOUNTER — Ambulatory Visit
Admission: RE | Admit: 2020-10-11 | Discharge: 2020-10-11 | Disposition: A | Discharge status: Home / Self Care | Payer: Medicare Other | Source: Ambulatory Visit | Attending: Oncology | Admitting: Oncology

## 2020-10-11 ENCOUNTER — Other Ambulatory Visit: Payer: Self-pay

## 2020-10-11 DIAGNOSIS — C3490 Malignant neoplasm of unspecified part of unspecified bronchus or lung: Secondary | ICD-10-CM | POA: Insufficient documentation

## 2020-10-11 DIAGNOSIS — J8410 Pulmonary fibrosis, unspecified: Secondary | ICD-10-CM | POA: Diagnosis not present

## 2020-10-11 DIAGNOSIS — J432 Centrilobular emphysema: Secondary | ICD-10-CM | POA: Diagnosis not present

## 2020-10-11 DIAGNOSIS — Z5111 Encounter for antineoplastic chemotherapy: Secondary | ICD-10-CM | POA: Insufficient documentation

## 2020-10-11 DIAGNOSIS — I700 Atherosclerosis of aorta: Secondary | ICD-10-CM | POA: Diagnosis not present

## 2020-10-11 HISTORY — DX: Malignant (primary) neoplasm, unspecified: C80.1

## 2020-10-11 MED ORDER — IOHEXOL 300 MG/ML  SOLN
75.0000 mL | Freq: Once | INTRAMUSCULAR | Status: AC | PRN
  Administered 2020-10-11: 60 mL via INTRAVENOUS

## 2020-10-12 ENCOUNTER — Telehealth: Payer: Self-pay | Admitting: Pharmacist

## 2020-10-12 ENCOUNTER — Telehealth: Payer: Medicare Other | Admitting: Hospice and Palliative Medicine

## 2020-10-12 ENCOUNTER — Telehealth: Payer: Self-pay | Admitting: *Deleted

## 2020-10-12 NOTE — Telephone Encounter (Signed)
I called the daughter and communicated the CT. He has had a partial response to Afatanib but he adamantly refuses to  take more dur to mucositis per my last discussion with him. I discussed about switching to osimertinib and daughter would like to check coverage and copay and be notified. Meanwhile, she will talk to patient to see if he is willing to try another medication.   Alyson and Bethena Roys would you please check for me. No need to order yet as I am not sure if pt will agree or not. Thanks.

## 2020-10-12 NOTE — Telephone Encounter (Signed)
Pt's daughter, [name], called to get results from CT scan from yesterday and wanting to know next steps regarding patient. She is requesting a call back [phone].

## 2020-10-12 NOTE — Telephone Encounter (Signed)
Oral Oncology Pharmacist Encounter  Patient and provider considering switch over to treatment with Tagrisso (osimertinib) for the treatment of metastatic NSCLC, EGFR uncommon mutation positive, planned duration until disease progression or unacceptable drug toxicity.  Prescription dose and frequency assessed.   Current medication list in Epic reviewed, a few DDIs with osimertinib identified: -Morphine: osimertinib may increase the serum concentration of morphine. Monitor patients for signs and symptoms of opioid toxicities (eg, respiratory depression). Baseline dose adjustment not required. -Citalopram: QT-prolonging Antidepressants may enhance the QTc-prolonging effect of osimertinib. ECG from 09/27/20 shows an QTc of 484. Recommend rechecking a ECG once patient is started on osimertinib.   Evaluated chart and no patient barriers to medication adherence identified.   Awaiting go ahead from patient and MD. MEdication approved by patient's insurance with a reasonable copay.  Oral Oncology Clinic will continue to follow status for initial counseling and start date.  Darl Pikes, PharmD, BCPS, BCOP, CPP Hematology/Oncology Clinical Pharmacist Practitioner [facility] [phone]  10/12/2020 1:30 PM

## 2020-10-17 ENCOUNTER — Telehealth: Payer: Self-pay | Admitting: Pharmacy Technician

## 2020-10-17 NOTE — Telephone Encounter (Signed)
Oral Oncology Patient Advocate Encounter  Prior Authorization for [place] Nip has been approved.    PA# [id number] Effective dates: 10/17/20 through 10/17/21  Patients co-pay is $0.00, subject to change in January.  Oral Oncology Clinic will continue to follow.   [place] Patient [place] Phone [phone] Fax [phone] 10/17/2020 12:01 PM

## 2020-10-17 NOTE — Telephone Encounter (Signed)
Oral Oncology Patient Advocate Encounter  Received notification from [facility] that prior authorization for Tagrisso is required.  PA submitted on CoverMyMeds Key [id number] Status is pending  Oral Oncology Clinic will continue to follow.  [place] Patient [place] Phone [phone] Fax [phone] 10/17/2020 12:00 PM

## 2020-10-18 ENCOUNTER — Telehealth: Payer: Self-pay | Admitting: *Deleted

## 2020-10-18 NOTE — Telephone Encounter (Signed)
[name] called reporting that patient is continuing his current oral chemotherapy 20 mg tablet which he had some on hand from last cycle. He does NOT want to change to a new medicine as he is content with the current treatment

## 2020-10-18 NOTE — Telephone Encounter (Signed)
Thanks for the update. So I will hold off switching him to Osimertinib.  Team please schedule him to follow up with me in 4 weeks. Lab cbc cmp MD thanks.

## 2020-10-22 ENCOUNTER — Ambulatory Visit: Payer: Medicare Other

## 2020-10-22 ENCOUNTER — Telehealth: Payer: Self-pay | Admitting: Pharmacist

## 2020-10-22 NOTE — Telephone Encounter (Signed)
Oral Chemotherapy Pharmacist Encounter   Patient decided to resume treatment with Gilotrif. Osimertinib is on hold for now.  Darl Pikes, PharmD, BCPS, BCOP, CPP Hematology/Oncology Clinical Pharmacist [facility] [phone]  10/22/2020 1:50 PM

## 2020-10-22 NOTE — Telephone Encounter (Signed)
Ellison Hughs please schedule as requested by MD and notify pt/ daughter of appts. Thanks

## 2020-10-22 NOTE — Telephone Encounter (Signed)
Oral Chemotherapy Pharmacist Encounter   Received notification that patient was restarting his Gilotrif. Attempted to call daughter to see how much medication Mr. Westfall has no hand to a reset refill call at [facility]. LVM.   Darl Pikes, PharmD, BCPS, BCOP, CPP Hematology/Oncology Clinical Pharmacist [facility] [phone]  10/22/2020 9:40 AM

## 2020-10-22 NOTE — Telephone Encounter (Signed)
Done.. Pts lab/MD appt has been sched as requested. Pts daughter [name] was made aware.

## 2020-10-23 ENCOUNTER — Inpatient Hospital Stay: Payer: Medicare Other | Admitting: Nurse Practitioner

## 2020-10-31 MED FILL — GILOTRIF 20 MG TABLET: 20 | 30 days supply | Qty: 30 | Fill #1

## 2020-11-08 ENCOUNTER — Telehealth: Payer: Self-pay | Admitting: *Deleted

## 2020-11-08 NOTE — Telephone Encounter (Signed)
Please arrange him to see symptom management, labs. And IVF. Thanks.

## 2020-11-08 NOTE — Telephone Encounter (Signed)
Received message from pt's daughter that she has noticed his appetite decrease over the past few weeks. She believes he may be having difficulty swallowing. States that he is able to eat a breakfast biscuit everyday but hasn't noticed him eating much of anything else from his pantry. Asking for further recommendations from Dr. Tasia Catchings to address his decreased appetite and presumed swallowing difficulty. Pt is scheduled for next follow up on 1/27.  Please advise.

## 2020-11-09 NOTE — Telephone Encounter (Signed)
Pt's daughter made aware. Scheduling message sent to schedule appts for [facility]. Per pt's daughter, pt would like to come next week on Thursday due to upcoming weather.

## 2020-11-15 ENCOUNTER — Inpatient Hospital Stay: Payer: Medicare Other

## 2020-11-15 ENCOUNTER — Inpatient Hospital Stay (HOSPITAL_BASED_OUTPATIENT_CLINIC_OR_DEPARTMENT_OTHER): Payer: Medicare Other | Admitting: Nurse Practitioner

## 2020-11-15 ENCOUNTER — Inpatient Hospital Stay: Payer: Medicare Other | Attending: Oncology

## 2020-11-15 VITALS — BP 92/55 | HR 95 | Temp 96.5°F | Resp 18

## 2020-11-15 DIAGNOSIS — E1165 Type 2 diabetes mellitus with hyperglycemia: Secondary | ICD-10-CM | POA: Diagnosis not present

## 2020-11-15 DIAGNOSIS — R5381 Other malaise: Secondary | ICD-10-CM | POA: Diagnosis not present

## 2020-11-15 DIAGNOSIS — E785 Hyperlipidemia, unspecified: Secondary | ICD-10-CM | POA: Diagnosis not present

## 2020-11-15 DIAGNOSIS — K219 Gastro-esophageal reflux disease without esophagitis: Secondary | ICD-10-CM | POA: Diagnosis not present

## 2020-11-15 DIAGNOSIS — E1136 Type 2 diabetes mellitus with diabetic cataract: Secondary | ICD-10-CM | POA: Insufficient documentation

## 2020-11-15 DIAGNOSIS — F17210 Nicotine dependence, cigarettes, uncomplicated: Secondary | ICD-10-CM | POA: Insufficient documentation

## 2020-11-15 DIAGNOSIS — K1230 Oral mucositis (ulcerative), unspecified: Secondary | ICD-10-CM | POA: Diagnosis not present

## 2020-11-15 DIAGNOSIS — E46 Unspecified protein-calorie malnutrition: Secondary | ICD-10-CM | POA: Diagnosis not present

## 2020-11-15 DIAGNOSIS — Z9221 Personal history of antineoplastic chemotherapy: Secondary | ICD-10-CM | POA: Insufficient documentation

## 2020-11-15 DIAGNOSIS — Z923 Personal history of irradiation: Secondary | ICD-10-CM | POA: Insufficient documentation

## 2020-11-15 DIAGNOSIS — Z79899 Other long term (current) drug therapy: Secondary | ICD-10-CM | POA: Insufficient documentation

## 2020-11-15 DIAGNOSIS — Z9114 Patient's other noncompliance with medication regimen: Secondary | ICD-10-CM | POA: Diagnosis not present

## 2020-11-15 DIAGNOSIS — Z794 Long term (current) use of insulin: Secondary | ICD-10-CM | POA: Insufficient documentation

## 2020-11-15 DIAGNOSIS — Z7984 Long term (current) use of oral hypoglycemic drugs: Secondary | ICD-10-CM | POA: Diagnosis not present

## 2020-11-15 DIAGNOSIS — M79662 Pain in left lower leg: Secondary | ICD-10-CM | POA: Insufficient documentation

## 2020-11-15 DIAGNOSIS — M7989 Other specified soft tissue disorders: Secondary | ICD-10-CM | POA: Diagnosis not present

## 2020-11-15 DIAGNOSIS — Z7982 Long term (current) use of aspirin: Secondary | ICD-10-CM | POA: Diagnosis not present

## 2020-11-15 DIAGNOSIS — C3490 Malignant neoplasm of unspecified part of unspecified bronchus or lung: Secondary | ICD-10-CM | POA: Insufficient documentation

## 2020-11-15 DIAGNOSIS — E860 Dehydration: Secondary | ICD-10-CM

## 2020-11-15 DIAGNOSIS — J449 Chronic obstructive pulmonary disease, unspecified: Secondary | ICD-10-CM | POA: Insufficient documentation

## 2020-11-15 DIAGNOSIS — C3480 Malignant neoplasm of overlapping sites of unspecified bronchus and lung: Secondary | ICD-10-CM

## 2020-11-15 LAB — CBC WITH DIFFERENTIAL/PLATELET
Abs Immature Granulocytes: 0.03 10*3/uL (ref 0.00–0.07)
Basophils Absolute: 0 10*3/uL (ref 0.0–0.1)
Basophils Relative: 0 %
Eosinophils Absolute: 0.2 10*3/uL (ref 0.0–0.5)
Eosinophils Relative: 3 %
HCT: 42.5 % (ref 39.0–52.0)
Hemoglobin: 15.5 g/dL (ref 13.0–17.0)
Immature Granulocytes: 1 %
Lymphocytes Relative: 9 %
Lymphs Abs: 0.6 10*3/uL — ABNORMAL LOW (ref 0.7–4.0)
MCH: 33.6 pg (ref 26.0–34.0)
MCHC: 36.5 g/dL — ABNORMAL HIGH (ref 30.0–36.0)
MCV: 92.2 fL (ref 80.0–100.0)
Monocytes Absolute: 0.7 10*3/uL (ref 0.1–1.0)
Monocytes Relative: 10 %
Neutro Abs: 5.1 10*3/uL (ref 1.7–7.7)
Neutrophils Relative %: 77 %
Platelets: 192 10*3/uL (ref 150–400)
RBC: 4.61 MIL/uL (ref 4.22–5.81)
RDW: 12.2 % (ref 11.5–15.5)
WBC: 6.5 10*3/uL (ref 4.0–10.5)
nRBC: 0 % (ref 0.0–0.2)

## 2020-11-15 LAB — COMPREHENSIVE METABOLIC PANEL
ALT: 14 U/L (ref 0–44)
AST: 19 U/L (ref 15–41)
Albumin: 3.4 g/dL — ABNORMAL LOW (ref 3.5–5.0)
Alkaline Phosphatase: 93 U/L (ref 38–126)
Anion gap: 10 (ref 5–15)
BUN: 17 mg/dL (ref 8–23)
CO2: 24 mmol/L (ref 22–32)
Calcium: 8.9 mg/dL (ref 8.9–10.3)
Chloride: 94 mmol/L — ABNORMAL LOW (ref 98–111)
Creatinine, Ser: 0.74 mg/dL (ref 0.61–1.24)
GFR, Estimated: >60 mL/min (ref >60)
Glucose, Bld: 588 mg/dL (ref 70–99)
Potassium: 3.9 mmol/L (ref 3.5–5.1)
Sodium: 128 mmol/L — ABNORMAL LOW (ref 135–145)
Total Bilirubin: 0.6 mg/dL (ref 0.3–1.2)
Total Protein: 6.2 g/dL — ABNORMAL LOW (ref 6.5–8.1)

## 2020-11-15 MED ORDER — SODIUM CHLORIDE 0.9% FLUSH
10.0000 mL | Freq: Once | INTRAVENOUS | Status: DC | PRN
  Filled 2020-11-15: qty 10

## 2020-11-15 MED ORDER — PREDNISONE 20 MG PO TABS: 20.0000 mg | ORAL_TABLET | Freq: Every morning | ORAL | 0 refills | Status: DC

## 2020-11-15 MED ORDER — SODIUM CHLORIDE 0.9 % IV SOLN
Freq: Once | INTRAVENOUS | Status: AC
  Filled 2020-11-15: qty 250

## 2020-11-15 MED ORDER — HEPARIN SOD (PORK) LOCK FLUSH 100 UNIT/ML IV SOLN
500.0000 [IU] | Freq: Once | INTRAVENOUS | Status: DC | PRN
  Filled 2020-11-15: qty 5

## 2020-11-15 NOTE — Progress Notes (Signed)
Pt here for IVF's. States his appetite is down. He does eat sausage and gravy biscuits, I suggested he eat them more then once a day if that goes down good for him. Pt states he does drink liquids. Drinking diet sprite in clinic. Pt admits to continuing to smoke. Pt reports mild pain in his mid back on the right side just under his rib cage. Pain is rated 1-2/10 on pain scale. Voided 550 ml in clinic. 1430 Critical Glucose called by lab. Beckey Rutter notified. MD states pt needs to go to ED. NP spoke with pt. He refused to go. He stated he did not take any insulin today. He will take it as soon as he gets home. Discharged from clinic in wheelchair.

## 2020-11-15 NOTE — Progress Notes (Signed)
Symptom Management [place]  [phone] [phone]  Patient Care Team: Venita Lick, NP as PCP - General (Nurse Practitioner) Judi Cong, MD as Physician Assistant (Endocrinology) Kem Parkinson, MD (Ophthalmology) Telford Nab, RN as Registered Nurse Tamala Julian, Jonette Eva, NP as Nurse Practitioner Earlie Server, MD as Consulting Physician (Oncology) Borders, Kirt Boys, NP as Referring Physician [facility])   Name of the patient: Colin Novak  [id number]  [birth year]   Date of visit: 11/15/20  Diagnosis- NSCLC  Chief complaint/ Reason for visit- poor appetite and weakness  Heme/Onc history:  Oncology History  Malignant neoplasm of lung ([place])  06/20/2019 Initial Diagnosis   Non-small cell lung cancer ([place])   01/16/2020 - 01/16/2020 Chemotherapy   The patient had dexamethasone (DECADRON) 4 MG tablet, 0 of 1 cycle, Start date: --, End date: -- palonosetron (ALOXI) injection 0.25 mg, 0.25 mg, Intravenous,  Once, 0 of 4 cycles PEMEtrexed (ALIMTA) 800 mg in sodium chloride 0.9 % 100 mL chemo infusion, 500 mg/m2, Intravenous,  Once, 0 of 4 cycles CARBOplatin (PARAPLATIN) in sodium chloride 0.9 % 100 mL chemo infusion, , Intravenous,  Once, 0 of 4 cycles fosaprepitant (EMEND) 150 mg in sodium chloride 0.9 % 145 mL IVPB, 150 mg, Intravenous,  Once, 0 of 4 cycles pembrolizumab (KEYTRUDA) 200 mg in sodium chloride 0.9 % 50 mL chemo infusion, 200 mg (original dose ), Intravenous, Once, 0 of 4 cycles Dose modification: 200 mg (Cycle 1, Reason: Other (see comments))  for chemotherapy treatment.    01/23/2020 - 01/23/2020 Chemotherapy   The patient had dexamethasone (DECADRON) 4 MG tablet, 1 of 1 cycle, Start date: 06/20/2019, End date: 01/16/2020 palonosetron (ALOXI) injection 0.25 mg, 0.25 mg, Intravenous,  Once, 0 of 4 cycles PEMEtrexed (ALIMTA) 775 mg in sodium chloride 0.9 % 100 mL chemo infusion, 500 mg/m2,  Intravenous,  Once, 0 of 4 cycles CARBOplatin (PARAPLATIN) in sodium chloride 0.9 % 100 mL chemo infusion, , Intravenous,  Once, 0 of 4 cycles fosaprepitant (EMEND) 150 mg, dexamethasone (DECADRON) 12 mg in sodium chloride 0.9 % 145 mL IVPB, , Intravenous,  Once, 0 of 4 cycles  for chemotherapy treatment.    07/26/2020 - 07/26/2020 Chemotherapy   The patient had [No matching medication found in this treatment plan]  for chemotherapy treatment.    10/05/2020 -  Chemotherapy   The patient had [No matching medication found in this treatment plan]  for chemotherapy treatment.      Interval history- Colin Novak, 75 year old male patient with above history of non-small cell lung cancer currently on gilotrif, who presents to Symptom management clinic for complaints of poor appetite, weakness, and leg pain. He feels he needs iv fluids today. Mucositis has resolved. Restricts his eating due to elevated blood sugars. Doesn't taken insulin due to preference. Not taking appetite stimulant. Reports pain in left leg behind his knee and mild localized swelling. Doesn't impair mobility. No recent falls or injuries. Pain started within the past week but unsure of specific onset.   Review of systems- Review of Systems  Constitutional: Positive for malaise/fatigue and weight loss. Negative for chills and fever.  HENT: Negative for hearing loss, nosebleeds, sore throat and tinnitus.   Eyes: Negative for blurred vision and double vision.  Respiratory: Positive for cough. Negative for hemoptysis, shortness of breath and wheezing.   Cardiovascular: Negative for chest pain, palpitations and leg swelling.  Gastrointestinal: Negative for abdominal pain, blood in stool, constipation, diarrhea, melena, nausea and  vomiting.  Genitourinary: Negative for dysuria and urgency.  Musculoskeletal: Positive for joint pain (left knee). Negative for back pain, falls and myalgias.  Skin: Negative for itching and rash.   Neurological: Positive for weakness. Negative for dizziness, tingling, sensory change, loss of consciousness and headaches.  Endo/Heme/Allergies: Negative for environmental allergies. Does not bruise/bleed easily.  Psychiatric/Behavioral: Negative for depression. The patient is not nervous/anxious and does not have insomnia.       Allergies  Allergen Reactions  . Penicillins Rash    Past Medical History:  Diagnosis Date  . Anxiety   . Cancer ([place])   . COPD (chronic obstructive pulmonary disease) ([place])   . Diabetes mellitus without complication ([place])    type 2  . GERD (gastroesophageal reflux disease)   . Hyperlipidemia     Past Surgical History:  Procedure Laterality Date  . APPENDECTOMY    . ENDOBRONCHIAL ULTRASOUND Bilateral 06/13/2019   Procedure: ENDOBRONCHIAL ULTRASOUND, BILATERAL;  Surgeon: [name], MD;  Location: [facility] ORS;  Service: Pulmonary;  Laterality: Bilateral;  . EYE SURGERY Bilateral    cataract    Social History   Socioeconomic History  . Marital status: Widowed    Spouse name: Not on file  . Number of children: 1  . Years of education: Not on file  . Highest education level: High school graduate  Occupational History  . Occupation: retired  Tobacco Use  . Smoking status: Current Every Day Smoker    Packs/day: 1.00    Types: Cigarettes  . Smokeless tobacco: Former Systems developer    Types: Chew    Quit date: 06/10/1959  . Tobacco comment: 1 pack daily- 06/09/2019  Vaping Use  . Vaping Use: Never used  Substance and Sexual Activity  . Alcohol use: No  . Drug use: No  . Sexual activity: Not Currently  Other Topics Concern  . Not on file  Social History Narrative  . Not on file   Social Determinants of Health   Financial Resource Strain: Not on file  Food Insecurity: Not on file  Transportation Needs: Not on file  Physical Activity: Not on file  Stress: Not on file  Social Connections: Not on file  Intimate Partner Violence: Not on  file    Family History  Problem Relation Age of Onset  . Cancer Mother        lung (non smoker)  . Diabetes Father   . Hypertension Father   . Lymphoma Brother   . Prostate cancer Brother      Current Outpatient Medications:  .  albuterol (PROAIR HFA) 108 (90 Base) MCG/ACT inhaler, Inhale 1-2 puffs into the lungs every 6 (six) hours as needed for wheezing or shortness of breath (use as needed for chest congestion.)., Disp: 1 g, Rfl: 3 .  aspirin EC 81 MG tablet, Take 81 mg by mouth daily., Disp: , Rfl:  .  citalopram (CELEXA) 20 MG tablet, Take 1 tablet (20 mg total) by mouth daily., Disp: 90 tablet, Rfl: 4 .  Dextromethorphan-guaiFENesin (ROBITUSSIN DM PO), Take 5 mLs by mouth at bedtime. , Disp: , Rfl:  .  fludrocortisone (FLORINEF) 0.1 MG tablet, Take 100 mcg by mouth daily., Disp: , Rfl:  .  glucose blood (ONE TOUCH ULTRA TEST) test strip, 1 each by Other route 2 (two) times daily as needed for other. Dx: E11.9, Disp: 100 each, Rfl: 12 .  HUMALOG KWIKPEN 100 UNIT/ML KwikPen, 10 Units. Sliding scale up to 12 units, Disp: , Rfl:  .  LANTUS SOLOSTAR 100 UNIT/ML Solostar Pen, INJECT 10-20 UNITS INTO THE SKIN DAILY (Patient taking differently: Inject 14 Units into the skin daily.), Disp: 5 pen, Rfl: 12 .  loperamide (IMODIUM) 2 MG capsule, Take 1 capsule (2 mg total) by mouth See admin instructions. Initial: 4 mg, followed by 2 mg after each loose stool; maximum: 16 mg/day (Patient not taking: No sig reported), Disp: 90 capsule, Rfl: 0 .  megestrol (MEGACE) 400 MG/10ML suspension, Take 10 mLs (400 mg total) by mouth daily. (Patient not taking: No sig reported), Disp: 240 mL, Rfl: 0 .  metFORMIN (GLUCOPHAGE) 500 MG tablet, TAKE ONE TABLET BY MOUTH TWICE DAILY (Patient taking differently: Take 1,000 mg by mouth daily with supper.), Disp: 60 tablet, Rfl: 12 .  Morphine Sulfate (MORPHINE CONCENTRATE) 10 mg / 0.5 ml concentrated solution, Take 0.5 mLs (10 mg total) by mouth every 6 (six) hours as  needed for moderate pain or severe pain., Disp: 30 mL, Rfl: 0 .  Multiple Vitamin (MULTIVITAMIN) tablet, Take 1 tablet by mouth daily. , Disp: , Rfl:  .  NOVOFINE 32G X 6 MM MISC, USE AS DIRECTED. 5 INJECTIONS A DAY, Disp: 900 each, Rfl: 0 No current facility-administered medications for this visit.  Facility-Administered Medications Ordered in Other Visits:  .  heparin lock flush 100 unit/mL, 500 Units, Intracatheter, Once PRN, Earlie Server, MD .  sodium chloride flush (NS) 0.9 % injection 10 mL, 10 mL, Intracatheter, Once PRN, Earlie Server, MD  Physical exam: There were no vitals filed for this visit. Physical Exam Constitutional:      General: He is not in acute distress.    Comments: cachectic  HENT:     Mouth/Throat:     Mouth: Mucous membranes are moist.     Pharynx: Posterior oropharyngeal erythema present.     Comments: No mucositis.  Eyes:     Conjunctiva/sclera: Conjunctivae normal.  Cardiovascular:     Rate and Rhythm: Normal rate and regular rhythm.  Pulmonary:     Effort: Pulmonary effort is normal. No respiratory distress.     Breath sounds: No wheezing.  Abdominal:     General: There is no distension.     Palpations: Abdomen is soft.     Tenderness: There is no abdominal tenderness.  Musculoskeletal:        General: No deformity or signs of injury.     Right lower leg: No edema.     Left lower leg: No edema.  Skin:    General: Skin is warm and dry.  Neurological:     Mental Status: He is alert and oriented to person, place, and time.     Motor: No weakness.  Psychiatric:        Mood and Affect: Mood normal.        Behavior: Behavior normal.      CMP Latest Ref Rng & Units 09/27/2020  Glucose 70 - 99 mg/dL 641(HH)  BUN 8 - 23 mg/dL 11  Creatinine 0.61 - 1.24 mg/dL 0.70  Sodium 135 - 145 mmol/L 131(L)  Potassium 3.5 - 5.1 mmol/L 4.5  Chloride 98 - 111 mmol/L 94(L)  CO2 22 - 32 mmol/L 23  Calcium 8.9 - 10.3 mg/dL 8.8(L)  Total Protein 6.5 - 8.1 g/dL 6.6   Total Bilirubin 0.3 - 1.2 mg/dL 1.0  Alkaline Phos 38 - 126 U/L 96  AST 15 - 41 U/L 21  ALT 0 - 44 U/L 16   CBC Latest Ref Rng & Units 11/15/2020  WBC 4.0 - 10.5 K/uL 6.5  Hemoglobin 13.0 - 17.0 g/dL 15.5  Hematocrit 39.0 - 52.0 % 42.5  Platelets 150 - 400 K/uL 192    No images are attached to the encounter.  No results found.  Assessment and plan- Patient is a 75 y.o. male diagnosed with lung cancer currently on gilotrif who presents to symptom management clinic for malaise, malnutrition, and left leg pain.   1.  Uncontrolled diabetes mellitus with hyperglycemia-blood sugar in clinic today is 588. Suspect hyperglycemia, not DKA as he is overall well appearing. Anion gap normal.  Discussed with Dr Tasia Catchings who recommends ER. Discussed with patient who declines. Says he has been noncompliant with medications and will take insulin and monitor sugars at home.   IV fluids in clinic. Corrected sodium 135.81 mEq/L; normal.   2. Malnutrition- chronic. Encouraged intake at home. Noncompliant with megace previously. Discussed prednisone but given hyperglycemia and noncompliance will hold off. If improved blood sugar control can reconsider. Will rule out VTE of LLE (see below) and then can reconsider megace.   3. Leg pain- acute pain behind left knee. No erythema or temperature changes. Ins etting of malignancy recommend venous dopplers to r/o DVT. Patient agreeable, however not able to have scan today due to transportation.   Ultrasound LLE asap; will assist with transportation needs. If negative, restart megace for appetite. Encouraged blood sugar control at home. Follow up with Dr. Tasia Catchings as scheduled.    Visit Diagnosis 1. Uncontrolled type 2 diabetes mellitus with hyperglycemia (HCC)   2. Pain and swelling of lower leg, left   3. Protein-calorie malnutrition, unspecified severity ([place])    Patient expressed understanding and was in agreement with this plan. He also understands that He can call  clinic at any time with any questions, concerns, or complaints.   Thank you for allowing me to participate in the care of this very pleasant patient.   Beckey Rutter, DNP, AGNP-C [place] [facility] [place]

## 2020-11-16 ENCOUNTER — Telehealth: Payer: Self-pay | Admitting: *Deleted

## 2020-11-16 ENCOUNTER — Encounter: Payer: Self-pay | Admitting: Nurse Practitioner

## 2020-11-16 NOTE — Telephone Encounter (Signed)
Spoke with pt's sister who is concerned about patient and is asking if pt could be scheduled for routine IVF since he felt overall better after receiving fluid yesterday. Pt does eat or drink very well at home and are hoping that scheduling for routine IVF that could help prevent dehydration and help pt feel better. Also, since pt's daughter is unable to attend appts with pt, she is asking if she could get a follow up phone call with MD recommendations so she knows how to help care for him when he is at home.   Please advise on scheduling pt for routine IVF.

## 2020-11-16 NOTE — Telephone Encounter (Signed)
Please add IVF to his next appt with me on 1/27

## 2020-11-19 NOTE — Telephone Encounter (Signed)
Done.Marland KitchenMarland Kitchen  IVF's has been added per MD recommends.

## 2020-11-19 NOTE — Telephone Encounter (Signed)
Please add IVF as MD recommends.

## 2020-11-20 ENCOUNTER — Ambulatory Visit
Admission: RE | Admit: 2020-11-20 | Discharge: 2020-11-20 | Disposition: A | Discharge status: Home / Self Care | Payer: Medicare Other | Source: Ambulatory Visit | Attending: Nurse Practitioner | Admitting: Nurse Practitioner

## 2020-11-20 ENCOUNTER — Other Ambulatory Visit: Payer: Self-pay

## 2020-11-20 DIAGNOSIS — M79662 Pain in left lower leg: Secondary | ICD-10-CM | POA: Diagnosis not present

## 2020-11-20 DIAGNOSIS — M79605 Pain in left leg: Secondary | ICD-10-CM | POA: Diagnosis not present

## 2020-11-20 DIAGNOSIS — M7989 Other specified soft tissue disorders: Secondary | ICD-10-CM | POA: Diagnosis not present

## 2020-11-22 ENCOUNTER — Inpatient Hospital Stay: Payer: Medicare Other

## 2020-11-22 ENCOUNTER — Inpatient Hospital Stay (HOSPITAL_BASED_OUTPATIENT_CLINIC_OR_DEPARTMENT_OTHER): Payer: Medicare Other | Admitting: Oncology

## 2020-11-22 ENCOUNTER — Encounter: Payer: Self-pay | Admitting: Oncology

## 2020-11-22 VITALS — BP 95/65 | HR 80 | Temp 95.9°F | Resp 16

## 2020-11-22 VITALS — BP 93/63 | HR 99 | Temp 96.6°F | Resp 18 | Wt 103.2 lb

## 2020-11-22 DIAGNOSIS — J449 Chronic obstructive pulmonary disease, unspecified: Secondary | ICD-10-CM | POA: Diagnosis not present

## 2020-11-22 DIAGNOSIS — E1136 Type 2 diabetes mellitus with diabetic cataract: Secondary | ICD-10-CM | POA: Diagnosis not present

## 2020-11-22 DIAGNOSIS — E785 Hyperlipidemia, unspecified: Secondary | ICD-10-CM | POA: Diagnosis not present

## 2020-11-22 DIAGNOSIS — C3490 Malignant neoplasm of unspecified part of unspecified bronchus or lung: Secondary | ICD-10-CM

## 2020-11-22 DIAGNOSIS — E46 Unspecified protein-calorie malnutrition: Secondary | ICD-10-CM | POA: Diagnosis not present

## 2020-11-22 DIAGNOSIS — Z794 Long term (current) use of insulin: Secondary | ICD-10-CM | POA: Diagnosis not present

## 2020-11-22 DIAGNOSIS — K1230 Oral mucositis (ulcerative), unspecified: Secondary | ICD-10-CM | POA: Diagnosis not present

## 2020-11-22 DIAGNOSIS — E1165 Type 2 diabetes mellitus with hyperglycemia: Secondary | ICD-10-CM

## 2020-11-22 DIAGNOSIS — M79662 Pain in left lower leg: Secondary | ICD-10-CM | POA: Diagnosis not present

## 2020-11-22 DIAGNOSIS — K219 Gastro-esophageal reflux disease without esophagitis: Secondary | ICD-10-CM | POA: Diagnosis not present

## 2020-11-22 DIAGNOSIS — M7989 Other specified soft tissue disorders: Secondary | ICD-10-CM | POA: Diagnosis not present

## 2020-11-22 DIAGNOSIS — Z9114 Patient's other noncompliance with medication regimen: Secondary | ICD-10-CM | POA: Diagnosis not present

## 2020-11-22 DIAGNOSIS — E860 Dehydration: Secondary | ICD-10-CM

## 2020-11-22 DIAGNOSIS — R5381 Other malaise: Secondary | ICD-10-CM | POA: Diagnosis not present

## 2020-11-22 DIAGNOSIS — R634 Abnormal weight loss: Secondary | ICD-10-CM

## 2020-11-22 DIAGNOSIS — F17210 Nicotine dependence, cigarettes, uncomplicated: Secondary | ICD-10-CM | POA: Diagnosis not present

## 2020-11-22 DIAGNOSIS — C3480 Malignant neoplasm of overlapping sites of unspecified bronchus and lung: Secondary | ICD-10-CM

## 2020-11-22 DIAGNOSIS — Z9221 Personal history of antineoplastic chemotherapy: Secondary | ICD-10-CM | POA: Diagnosis not present

## 2020-11-22 LAB — COMPREHENSIVE METABOLIC PANEL
ALT: 16 U/L (ref 0–44)
AST: 18 U/L (ref 15–41)
Albumin: 3.4 g/dL — ABNORMAL LOW (ref 3.5–5.0)
Alkaline Phosphatase: 95 U/L (ref 38–126)
Anion gap: 6 (ref 5–15)
BUN: 9 mg/dL (ref 8–23)
CO2: 29 mmol/L (ref 22–32)
Calcium: 8.7 mg/dL — ABNORMAL LOW (ref 8.9–10.3)
Chloride: 97 mmol/L — ABNORMAL LOW (ref 98–111)
Creatinine, Ser: 0.53 mg/dL — ABNORMAL LOW (ref 0.61–1.24)
GFR, Estimated: >60 mL/min (ref >60)
Glucose, Bld: 343 mg/dL — ABNORMAL HIGH (ref 70–99)
Potassium: 4.6 mmol/L (ref 3.5–5.1)
Sodium: 132 mmol/L — ABNORMAL LOW (ref 135–145)
Total Bilirubin: 0.6 mg/dL (ref 0.3–1.2)
Total Protein: 6.7 g/dL (ref 6.5–8.1)

## 2020-11-22 LAB — CBC WITH DIFFERENTIAL/PLATELET
Abs Immature Granulocytes: 0.01 10*3/uL (ref 0.00–0.07)
Basophils Absolute: 0 10*3/uL (ref 0.0–0.1)
Basophils Relative: 1 %
Eosinophils Absolute: 0 10*3/uL (ref 0.0–0.5)
Eosinophils Relative: 1 %
HCT: 40.2 % (ref 39.0–52.0)
Hemoglobin: 14.3 g/dL (ref 13.0–17.0)
Immature Granulocytes: 0 %
Lymphocytes Relative: 13 %
Lymphs Abs: 0.6 10*3/uL — ABNORMAL LOW (ref 0.7–4.0)
MCH: 33.7 pg (ref 26.0–34.0)
MCHC: 35.6 g/dL (ref 30.0–36.0)
MCV: 94.8 fL (ref 80.0–100.0)
Monocytes Absolute: 0.6 10*3/uL (ref 0.1–1.0)
Monocytes Relative: 11 %
Neutro Abs: 3.7 10*3/uL (ref 1.7–7.7)
Neutrophils Relative %: 74 %
Platelets: 241 10*3/uL (ref 150–400)
RBC: 4.24 MIL/uL (ref 4.22–5.81)
RDW: 12.2 % (ref 11.5–15.5)
WBC: 5 10*3/uL (ref 4.0–10.5)
nRBC: 0 % (ref 0.0–0.2)

## 2020-11-22 MED ORDER — SODIUM CHLORIDE 0.9 % IV SOLN
Freq: Once | INTRAVENOUS | Status: AC
  Filled 2020-11-22: qty 250

## 2020-11-22 NOTE — Progress Notes (Signed)
Hematology/Oncology follow up note [facility] Telephone:([phone]) [phone] Fax:([phone]) [phone]   Patient Care Team: Colin Lick, NP as PCP - General (Nurse Practitioner) Colin Carina Betsey Holiday, MD as Physician Assistant (Endocrinology) Colin Parkinson, MD (Ophthalmology) Colin Nab, RN as Registered Nurse Colin Novak, Colin Eva, NP as Nurse Practitioner (Hospice and Palliative Medicine) Colin Server, MD as Consulting Physician (Oncology) Borders, Colin Boys, NP as Referring Physician (Hospice and Palliative Medicine)  REFERRING PROVIDER: Venita Lick, NP  CHIEF COMPLAINTS/REASON FOR VISIT:  Follow up for non-small cell lung cancer  HISTORY OF PRESENTING ILLNESS:  Colin Novak is a  75 y.o.  male with PMH listed below was seen in consultation at the request of  Colin Guarneri T, NP  for evaluation of lung mass Patient reports ongoing shortness of breath, cough for  few months. He had CT chest scan done on 06/01/2019 which showed a concerning feature of 9.1 x 5.1 x 3.6 cm right upper lobe mass extending into mediastinum, encasing and occluding right upper lobe bronchus and some of the right upper lobe bronchial branches.  There is also significant narrowing of FVC. He also had a similar appearance of left upper lobe mass without mediastinal extension suspicious for a synchronized primary lung cancer Multiple additional smaller bilateral lung nodules, left greater than right. Mildly enlarged right hilar and mediastinal lymph nodes suspicious for metastatic lymphadenopathy. 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 Daughter Colin Novak is his power of attorney. Denies fever or chills.  # patient was seen by me on 06/20/2019.  At that time, patient has 2 separate lung tumor.  Possibility of stage IV lung adenocarcinoma TX N1 M1 discussed with patient given that he has tumor in  the contralateral lobe.  Systemic chemotherapy with immunotherapy was recommended.  Patient declined at that time. He may have 2 separate primary lung cancer, if that is the case, he has stage III lung cancer.  would benefit from definitive concurrent chemoradiation followed by immunotherapy for maintenance. Patient has decided at that time that he does not want any systemic chemotherapy.   08/05/2019 He finished radiation to right upper lobe lesion.  09/16/2019 s/p radiation boost  01/06/2020 CT scan showed interval increase of the right para mediastinal and hilar mass and also multiple areas of increased density in the left lower lobe consistent with multifocal adenocarcinoma/metastasis.  #01/23/2020, PET scan showed a decreased left upper lobe and right upper lobe lesion. There is still occlusion of the right upper lobe bronchus with some associated atelectasis.Enlarged left lung nodules have not had increased metabolic activity and remained well below blood pool.New sclerosis in the upper sternal body comparing to previous PET scan. -Patient was not interested in any systemic treatment.  Declined immunotherapy maintenance.  NGS was sent.  he follows up with palliative care service.  # 08/02/2020 PET scan images were reviewed  No recurrent hypermetabolism at the site of masslike fibrosis in paramediastinal right upper lobe  Mildly hypermetabolic left upper lobe 4.4WN mass, not changed since last PET 6 months ago,  Numerous (>10) irregular pulmonary nodules scattered through out bilateral lungs,increased or new from last study. Non has significant FDG uptake, compatible with progression of metastatic disease. No recurrent hypermetabolism at the site of masslike fibrosis in the right upper lobe. No recurrent hypermetabolic thoracic Adenopathy. No hypermetabolic metastatic disease in the abdomen, pelvis or Skeleton  # 07/31/2020-08/29/2020  Afatinib 90m daily- Stopped due  to mucositis which affected his  eating.  09/09/2020 restarted on Afatinib 57m daily.  He took about 10 days and he stopped it due to mucositis.   INTERVAL HISTORY Colin MURNANEis a 75y.o. male who has above history reviewed by me today presents for follow up visit for management of  non-small cell lung cancer. Problems and complaints are listed below: 10/11/2020, CT chest with contrast showed interval decrease in size of the medial right upper lobe masslike fibrosis described previously.  Index nodule of the posterior left upper lobe and left lower lobe measures smaller.  Bilateral pulmonary nodules are similar or smaller during the interval. I had a discussion with patient, and his daughter about options of stay on afatinib low-dose versus switch to osimertinib.  Patient/daughter called back and expressed wishes to stay on afatinib 20 mg. Today patient informed me that he had not been taking afatinib due to mucositis.  Last dose was about a month ago.  Mucositis has improved since the start of the medication He does not eat very well.  He feels previous IV fluid sessions have helped him. He has poorly controlled diabetes with hyperglycemia.  He has been advised to go to emergency room for glucose levels of 588 and he declined.  Today he is blood glucose level is 343 Denies any shortness of breath than his baseline, cough, chest pain or diarrhea.    Review of Systems  Constitutional: Positive for fatigue. Negative for appetite change, chills, diaphoresis, fever and unexpected weight change.  HENT:   Negative for hearing loss, lump/mass, nosebleeds, sore throat and voice change.        Mucositis  Eyes: Negative for eye problems and icterus.  Respiratory: Negative for chest tightness, cough, hemoptysis, shortness of breath and wheezing.   Cardiovascular: Negative for chest pain and leg swelling.  Gastrointestinal: Negative for abdominal distention, abdominal pain, blood in stool, diarrhea, nausea and rectal pain.   Endocrine: Negative for hot flashes.  Genitourinary: Negative for bladder incontinence, difficulty urinating, dysuria, frequency, hematuria and nocturia.   Musculoskeletal: Positive for back pain. Negative for arthralgias, flank pain, gait problem and myalgias.  Skin: Negative for itching and rash.  Neurological: Negative for dizziness, gait problem, headaches, light-headedness, numbness and seizures.  Hematological: Negative for adenopathy. Does not bruise/bleed easily.  Psychiatric/Behavioral: Negative for confusion and decreased concentration. The patient is not nervous/anxious.     MEDICAL HISTORY:  Past Medical History:  Diagnosis Date  . Anxiety   . Cancer ([place]   . [place] disease[place]   . Diabetes mellitus without complication ([place]    type 2  . GERD (gastroesophageal reflux disease)   . Hyperlipidemia     SURGICAL HISTORY: Past Surgical History:  Procedure Laterality Date  . APPENDECTOMY    . ENDOBRONCHIAL ULTRASOUND Bilateral 06/13/2019   Procedure: ENDOBRONCHIAL ULTRASOUND, BILATERAL;  Surgeon: RLaverle Hobby MD;  Location: [facility] ORS;  Service: Pulmonary;  Laterality: Bilateral;  . EYE SURGERY Bilateral    cataract    SOCIAL HISTORY: Social History   Socioeconomic History  . Marital status: Widowed    Spouse name: Not on file  . Number of children: 1  . Years of education: Not on file  . Highest education level: High school graduate  Occupational History  . Occupation: retired  Tobacco Use  . Smoking status: Current Every Day Smoker    Packs/day: 1.00    Types: Cigarettes  . Smokeless tobacco: Former [employer] developer   Types:  Sarina Ser    Quit date: 06/10/1959  . Tobacco comment: 1 pack daily- 06/09/2019  Vaping Use  . Vaping Use: Never used  Substance and Sexual Activity  . Alcohol use: No  . Drug use: No  . Sexual activity: Not Currently  Other Topics Concern  . Not on file  Social History Narrative  . Not on file   Social  Determinants of Health   Financial Resource Strain: Not on file  Food Insecurity: Not on file  Transportation Needs: Not on file  Physical Activity: Not on file  Stress: Not on file  Social Connections: Not on file  Intimate Partner Violence: Not on file    FAMILY HISTORY: Family History  Problem Relation Age of Onset  . Cancer Mother        lung (non smoker)  . Diabetes Father   . Hypertension Father   . Lymphoma Brother   . Prostate cancer Brother     ALLERGIES:  is allergic to penicillins.  MEDICATIONS:  Current Outpatient Medications  Medication Sig Dispense Refill  . albuterol (PROAIR HFA) 108 (90 Base) MCG/ACT inhaler Inhale 1-2 puffs into the lungs every 6 (six) hours as needed for wheezing or shortness of breath (use as needed for chest congestion.). 1 g 3  . aspirin EC 81 MG tablet Take 81 mg by mouth daily.    . citalopram (CELEXA) 20 MG tablet Take 1 tablet (20 mg total) by mouth daily. 90 tablet 4  . Dextromethorphan-guaiFENesin (ROBITUSSIN DM PO) Take 5 mLs by mouth at bedtime.     . fludrocortisone (FLORINEF) 0.1 MG tablet Take 100 mcg by mouth daily.    Marland Kitchen GILOTRIF 20 MG tablet Take 20 mg by mouth daily.    Marland Kitchen glucose blood (ONE TOUCH ULTRA TEST) test strip 1 each by Other route 2 (two) times daily as needed for other. Dx: E11.9 100 each 12  . HUMALOG KWIKPEN 100 UNIT/ML KwikPen 10 Units. Sliding scale up to 12 units    . LANTUS SOLOSTAR 100 UNIT/ML Solostar Pen INJECT 10-20 UNITS INTO THE SKIN DAILY (Patient taking differently: Inject 14 Units into the skin daily.) 5 pen 12  . metFORMIN (GLUCOPHAGE) 500 MG tablet TAKE ONE TABLET BY MOUTH TWICE DAILY (Patient taking differently: Take 1,000 mg by mouth daily with supper.) 60 tablet 12  . Morphine Sulfate (MORPHINE CONCENTRATE) 10 mg / 0.5 ml concentrated solution Take 0.5 mLs (10 mg total) by mouth every 6 (six) hours as needed for moderate pain or severe pain. 30 mL 0  . Multiple Vitamin (MULTIVITAMIN) tablet Take 1  tablet by mouth daily.     Marland Kitchen NOVOFINE 32G X 6 MM MISC USE AS DIRECTED. 5 INJECTIONS A DAY 900 each 0  . loperamide (IMODIUM) 2 MG capsule Take 1 capsule (2 mg total) by mouth See admin instructions. Initial: 4 mg, followed by 2 mg after each loose stool; maximum: 16 mg/day (Patient not taking: No sig reported) 90 capsule 0  . megestrol (MEGACE) 400 MG/10ML suspension Take 10 mLs (400 mg total) by mouth daily. (Patient not taking: No sig reported) 240 mL 0   No current facility-administered medications for this visit.     PHYSICAL EXAMINATION: ECOG PERFORMANCE STATUS: 2 - Symptomatic, <50% confined to bed Vitals:   11/22/20 1012  BP: 93/63  Pulse: 99  Resp: 18  Temp: (!) 96.6 F (35.9 C)   Filed Weights   11/22/20 1012  Weight: 103 lb 3.2 oz (46.8 kg)  Physical Exam Constitutional:      General: He is not in acute distress.    Appearance: He is not ill-appearing.     Comments: Frail  HENT:     Head: Normocephalic and atraumatic.  Eyes:     General: No scleral icterus.    Pupils: Pupils are equal, round, and reactive to light.  Cardiovascular:     Rate and Rhythm: Normal rate and regular rhythm.     Heart sounds: Normal heart sounds.  Pulmonary:     Effort: Pulmonary effort is normal. No respiratory distress.     Breath sounds: No wheezing.     Comments: Decreased breath sounds bilaterally.  Abdominal:     General: Bowel sounds are normal. There is no distension.     Palpations: Abdomen is soft. There is no mass.  Musculoskeletal:        General: No deformity. Normal range of motion.     Cervical back: Normal range of motion and neck supple.     Comments: Clubbing  Skin:    General: Skin is warm and dry.     Findings: No erythema or rash.  Neurological:     Mental Status: He is alert and oriented to person, place, and time. Mental status is at baseline.     Cranial Nerves: No cranial nerve deficit.     Coordination: Coordination normal.  Psychiatric:         Mood and Affect: Mood normal.     LABORATORY DATA:  I have reviewed the data as listed Lab Results  Component Value Date   WBC 5.0 11/22/2020   HGB 14.3 11/22/2020   HCT 40.2 11/22/2020   MCV 94.8 11/22/2020   PLT 241 11/22/2020   Recent Labs    01/16/20 0942 07/24/20 1104 07/26/20 1001 07/31/20 1059 09/27/20 1235 11/15/20 1315 11/22/20 0951  NA 132*  --  130*   < > 131* 128* 132*  K 4.9  --  4.4   < > 4.5 3.9 4.6  CL 98  --  92*   < > 94* 94* 97*  CO2 24  --  26   < > 23 24 29   GLUCOSE 460*  --  395*   < > 641* 588* 343*  BUN 13  --  15   < > 11 17 9   CREATININE 0.80   < > 0.77   < > 0.70 0.74 0.53*  CALCIUM 9.3  --  9.0   < > 8.8* 8.9 8.7*  GFRNONAA >60  --  >60   < > >60 >60 >60  GFRAA >60  --  >60  --   --   --   --   PROT 7.7  --  7.1   < > 6.6 6.2* 6.7  ALBUMIN 4.4  --  4.0   < > 3.6 3.4* 3.4*  AST 30  --  15   < > 21 19 18   ALT 33  --  12   < > 16 14 16   ALKPHOS 110  --  93   < > 96 93 95  BILITOT 0.9  --  1.0   < > 1.0 0.6 0.6   < > = values in this interval not displayed.   Iron/TIBC/Ferritin/ %Sat No results found for: IRON, TIBC, FERRITIN, IRONPCTSAT    RADIOGRAPHIC STUDIES: I have personally reviewed the radiological images as listed and agreed with the findings in the report.  US Venous Img Lower Unilateral Left  Result Date: 11/20/2020 CLINICAL DATA:  LEFT leg pain and swelling for 5 days question blood clots EXAM: LEFT LOWER EXTREMITY VENOUS DOPPLER ULTRASOUND TECHNIQUE: Gray-scale sonography with compression, as well as color and duplex ultrasound, were performed to evaluate the deep venous system(s) from the level of the common femoral vein through the popliteal and proximal calf veins. COMPARISON:  None FINDINGS: VENOUS Normal compressibility of the common femoral, superficial femoral, and popliteal veins, as well as the visualized calf veins. Visualized portions of profunda femoral vein and great saphenous vein unremarkable. No filling defects to  suggest DVT on grayscale or color Doppler imaging. Doppler waveforms show normal direction of venous flow, normal respiratory plasticity and response to augmentation. Limited views of the contralateral common femoral vein are unremarkable. OTHER None. Limitations: none IMPRESSION: No evidence of deep venous thrombosis in the LEFT lower extremity. Electronically Signed   By: Lavonia Dana M.D.   On: 11/20/2020 13:58      ASSESSMENT & PLAN:  1. NSCLC with EGFR mutation ([place])   2. Protein-calorie malnutrition, unspecified severity ([place])   3. Mucositis   4. Weight loss, unintentional   5. 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  NGS EGFR G719S+, [id number], PD-L1 30%  Labs are reviewed and discussed with patient.  He does not tolerate afatinib.   CT scan in December showed partial response.  Currently he has poor performance status.  Recommend observation.  And repeat CT scan in February or March.  If disease progression, we will switch him to osimertinib.  For now continue supportive care . Poor oral intake due to mucositis, mucositis has improved since the start of afatinib. Patient will proceed with 1 L of IV normal saline x1 today Malnutrition, continue nutritional supplementation. Poorly controlled diabetes, hyperglycemia. Continue follow-up with primary care provider Follow-up 2 weeks. All questions were answered. The patient knows to call the clinic with any problems questions or concerns.  Colin Server, MD, PhD Hematology Oncology [facility] Pager- [phone] 11/22/2020

## 2020-11-22 NOTE — Progress Notes (Signed)
Pt received IV hydration today. He said it made him feel so much better last week. VSS at discharge. Pushed to lobby in wheelchair for [employer].

## 2020-11-22 NOTE — Progress Notes (Signed)
Pt here for follow up. No new concerns voiced.   

## 2020-12-06 ENCOUNTER — Inpatient Hospital Stay: Payer: Medicare Other

## 2020-12-06 ENCOUNTER — Inpatient Hospital Stay: Payer: Medicare Other | Attending: Oncology

## 2020-12-06 ENCOUNTER — Inpatient Hospital Stay (HOSPITAL_BASED_OUTPATIENT_CLINIC_OR_DEPARTMENT_OTHER): Payer: Medicare Other | Admitting: Oncology

## 2020-12-06 ENCOUNTER — Encounter: Payer: Self-pay | Admitting: Oncology

## 2020-12-06 ENCOUNTER — Inpatient Hospital Stay (HOSPITAL_BASED_OUTPATIENT_CLINIC_OR_DEPARTMENT_OTHER): Payer: Medicare Other | Admitting: Hospice and Palliative Medicine

## 2020-12-06 VITALS — BP 95/62 | HR 111 | Temp 97.8°F | Resp 18 | Wt 101.4 lb

## 2020-12-06 VITALS — BP 95/65 | HR 88 | Resp 18

## 2020-12-06 DIAGNOSIS — C3490 Malignant neoplasm of unspecified part of unspecified bronchus or lung: Secondary | ICD-10-CM | POA: Diagnosis not present

## 2020-12-06 DIAGNOSIS — E46 Unspecified protein-calorie malnutrition: Secondary | ICD-10-CM

## 2020-12-06 DIAGNOSIS — R590 Localized enlarged lymph nodes: Secondary | ICD-10-CM | POA: Insufficient documentation

## 2020-12-06 DIAGNOSIS — Z7189 Other specified counseling: Secondary | ICD-10-CM | POA: Diagnosis not present

## 2020-12-06 DIAGNOSIS — E785 Hyperlipidemia, unspecified: Secondary | ICD-10-CM | POA: Insufficient documentation

## 2020-12-06 DIAGNOSIS — F17210 Nicotine dependence, cigarettes, uncomplicated: Secondary | ICD-10-CM | POA: Diagnosis not present

## 2020-12-06 DIAGNOSIS — Z515 Encounter for palliative care: Secondary | ICD-10-CM | POA: Insufficient documentation

## 2020-12-06 DIAGNOSIS — Z9221 Personal history of antineoplastic chemotherapy: Secondary | ICD-10-CM | POA: Diagnosis not present

## 2020-12-06 DIAGNOSIS — Z794 Long term (current) use of insulin: Secondary | ICD-10-CM | POA: Diagnosis not present

## 2020-12-06 DIAGNOSIS — Z79899 Other long term (current) drug therapy: Secondary | ICD-10-CM | POA: Insufficient documentation

## 2020-12-06 DIAGNOSIS — E43 Unspecified severe protein-calorie malnutrition: Secondary | ICD-10-CM | POA: Diagnosis not present

## 2020-12-06 DIAGNOSIS — K219 Gastro-esophageal reflux disease without esophagitis: Secondary | ICD-10-CM | POA: Insufficient documentation

## 2020-12-06 DIAGNOSIS — R634 Abnormal weight loss: Secondary | ICD-10-CM | POA: Diagnosis not present

## 2020-12-06 DIAGNOSIS — Z7982 Long term (current) use of aspirin: Secondary | ICD-10-CM | POA: Diagnosis not present

## 2020-12-06 DIAGNOSIS — R531 Weakness: Secondary | ICD-10-CM | POA: Diagnosis not present

## 2020-12-06 DIAGNOSIS — Z66 Do not resuscitate: Secondary | ICD-10-CM | POA: Diagnosis not present

## 2020-12-06 DIAGNOSIS — Z923 Personal history of irradiation: Secondary | ICD-10-CM | POA: Insufficient documentation

## 2020-12-06 DIAGNOSIS — I712 Thoracic aortic aneurysm, without rupture: Secondary | ICD-10-CM | POA: Diagnosis not present

## 2020-12-06 DIAGNOSIS — C3480 Malignant neoplasm of overlapping sites of unspecified bronchus and lung: Secondary | ICD-10-CM

## 2020-12-06 DIAGNOSIS — E119 Type 2 diabetes mellitus without complications: Secondary | ICD-10-CM | POA: Insufficient documentation

## 2020-12-06 DIAGNOSIS — J449 Chronic obstructive pulmonary disease, unspecified: Secondary | ICD-10-CM | POA: Insufficient documentation

## 2020-12-06 LAB — CBC WITH DIFFERENTIAL/PLATELET
Abs Immature Granulocytes: 0.04 10*3/uL (ref 0.00–0.07)
Basophils Absolute: 0 10*3/uL (ref 0.0–0.1)
Basophils Relative: 1 %
Eosinophils Absolute: 0 10*3/uL (ref 0.0–0.5)
Eosinophils Relative: 0 %
HCT: 41.9 % (ref 39.0–52.0)
Hemoglobin: 15.1 g/dL (ref 13.0–17.0)
Immature Granulocytes: 1 %
Lymphocytes Relative: 9 %
Lymphs Abs: 0.6 10*3/uL — ABNORMAL LOW (ref 0.7–4.0)
MCH: 34.2 pg — ABNORMAL HIGH (ref 26.0–34.0)
MCHC: 36 g/dL (ref 30.0–36.0)
MCV: 94.8 fL (ref 80.0–100.0)
Monocytes Absolute: 0.6 10*3/uL (ref 0.1–1.0)
Monocytes Relative: 10 %
Neutro Abs: 4.8 10*3/uL (ref 1.7–7.7)
Neutrophils Relative %: 79 %
Platelets: 241 10*3/uL (ref 150–400)
RBC: 4.42 MIL/uL (ref 4.22–5.81)
RDW: 12.8 % (ref 11.5–15.5)
WBC: 6 10*3/uL (ref 4.0–10.5)
nRBC: 0 % (ref 0.0–0.2)

## 2020-12-06 LAB — COMPREHENSIVE METABOLIC PANEL
ALT: 15 U/L (ref 0–44)
AST: 16 U/L (ref 15–41)
Albumin: 3.3 g/dL — ABNORMAL LOW (ref 3.5–5.0)
Alkaline Phosphatase: 102 U/L (ref 38–126)
Anion gap: 11 (ref 5–15)
BUN: 12 mg/dL (ref 8–23)
CO2: 29 mmol/L (ref 22–32)
Calcium: 8.9 mg/dL (ref 8.9–10.3)
Chloride: 96 mmol/L — ABNORMAL LOW (ref 98–111)
Creatinine, Ser: 0.52 mg/dL — ABNORMAL LOW (ref 0.61–1.24)
GFR, Estimated: >60 mL/min (ref >60)
Glucose, Bld: 202 mg/dL — ABNORMAL HIGH (ref 70–99)
Potassium: 3.7 mmol/L (ref 3.5–5.1)
Sodium: 136 mmol/L (ref 135–145)
Total Bilirubin: 0.9 mg/dL (ref 0.3–1.2)
Total Protein: 6.9 g/dL (ref 6.5–8.1)

## 2020-12-06 MED ORDER — SODIUM CHLORIDE 0.9 % IV SOLN
Freq: Once | INTRAVENOUS | Status: AC
  Filled 2020-12-06: qty 250

## 2020-12-06 NOTE — Progress Notes (Signed)
Pt here for follow up. No new concerns voiced.   

## 2020-12-06 NOTE — Progress Notes (Signed)
Hematology/Oncology follow up note [facility] [phone] [phone]   Patient Care Team: Guadalupe Maple, MD as PCP - General (Family Medicine) Solum, Betsey Holiday, MD as Physician Assistant (Endocrinology) Kem Parkinson, MD (Ophthalmology) Telford Nab, RN as Registered Nurse Tamala Julian, Jonette Eva, NP as Nurse Practitioner (Hospice and Palliative Medicine) Earlie Server, MD as Consulting Physician (Oncology) Borders, Kirt Boys, NP as Referring Physician (Hospice and Palliative Medicine)  REFERRING PROVIDER: Venita Lick, NP  CHIEF COMPLAINTS/REASON FOR VISIT:  Follow up for non-small cell lung cancer  HISTORY OF PRESENTING ILLNESS:  Colin Novak is a  75 y.o.  male with PMH listed below was seen in consultation at the request of  Marnee Guarneri T, NP  for evaluation of lung mass Patient reports ongoing shortness of breath, cough for  few months. He had CT chest scan done on 06/01/2019 which showed a concerning feature of 9.1 x 5.1 x 3.6 cm right upper lobe mass extending into mediastinum, encasing and occluding right upper lobe bronchus and some of the right upper lobe bronchial branches.  There is also significant narrowing of FVC. He also had a similar appearance of left upper lobe mass without mediastinal extension suspicious for a synchronized primary lung cancer Multiple additional smaller bilateral lung nodules, left greater than right. Mildly enlarged right hilar and mediastinal lymph nodes suspicious for metastatic lymphadenopathy. 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 Daughter [name] is his power of attorney. Denies fever or chills.  # patient was seen by me on 06/20/2019.  At that time, patient has 2 separate lung tumor.  Possibility of stage IV lung adenocarcinoma TX N1 M1 discussed with patient given that he has tumor in the  contralateral lobe.  Systemic chemotherapy with immunotherapy was recommended.  Patient declined at that time. He may have 2 separate primary lung cancer, if that is the case, he has stage III lung cancer.  would benefit from definitive concurrent chemoradiation followed by immunotherapy for maintenance. Patient has decided at that time that he does not want any systemic chemotherapy.   08/05/2019 He finished radiation to right upper lobe lesion.  09/16/2019 s/p radiation boost  01/06/2020 CT scan showed interval increase of the right para mediastinal and hilar mass and also multiple areas of increased density in the left lower lobe consistent with multifocal adenocarcinoma/metastasis.  #01/23/2020, PET scan showed a decreased left upper lobe and right upper lobe lesion. There is still occlusion of the right upper lobe bronchus with some associated atelectasis.Enlarged left lung nodules have not had increased metabolic activity and remained well below blood pool.New sclerosis in the upper sternal body comparing to previous PET scan. -Patient was not interested in any systemic treatment.  Declined immunotherapy maintenance.  NGS was sent.  he follows up with palliative care service.  # 08/02/2020 PET scan images were reviewed  No recurrent hypermetabolism at the site of masslike fibrosis in paramediastinal right upper lobe  Mildly hypermetabolic left upper lobe 3.8GY mass, not changed since last PET 6 months ago,  Numerous (>10) irregular pulmonary nodules scattered through out bilateral lungs,increased or new from last study. Non has significant FDG uptake, compatible with progression of metastatic disease. No recurrent hypermetabolism at the site of masslike fibrosis in the right upper lobe. No recurrent hypermetabolic thoracic Adenopathy. No hypermetabolic metastatic disease in the abdomen, pelvis or Skeleton  # 07/31/2020-08/29/2020  Afatinib 82m daily- Stopped due  to mucositis which affected his  eating.  09/09/2020 restarted on Afatinib 2m daily.  He took about 10 days and he stopped it due to mucositis.  # 10/11/2020, CT chest with contrast showed interval decrease in size of the medial right upper lobe masslike fibrosis described previously.  Index nodule of the posterior left upper lobe and left lower lobe measures smaller.  Bilateral pulmonary nodules are similar or smaller during the interval. I had a discussion with patient, and his daughter about options of stay on afatinib low-dose versus switch to osimertinib.  Patient/daughter called back and expressed wishes to stay on afatinib 20 mg. Today patient informed me that he had not been taking afatinib due to mucositis.  Last dose was about a month ago.  Mucositis has improved since the start of the medication   INTERVAL HISTORY Colin BROUGHTONis a 75y.o. male who has above history reviewed by me today presents for follow up visit for management of  non-small cell lung cancer. Problems and complaints are listed below: Patient has elected to stop afatinib.Mucositis has resolved. Reports appetite has slightly improved.  Lost 2 pounds since last visit.     Review of Systems  Constitutional: Positive for appetite change, fatigue and unexpected weight change. Negative for chills, diaphoresis and fever.  HENT:   Negative for hearing loss, lump/mass, nosebleeds, sore throat and voice change.   Eyes: Negative for eye problems and icterus.  Respiratory: Negative for chest tightness, cough, hemoptysis, shortness of breath and wheezing.   Cardiovascular: Negative for chest pain and leg swelling.  Gastrointestinal: Negative for abdominal distention, abdominal pain, blood in stool, diarrhea, nausea and rectal pain.  Endocrine: Negative for hot flashes.  Genitourinary: Negative for bladder incontinence, difficulty urinating, dysuria, frequency, hematuria and nocturia.   Musculoskeletal: Positive for back pain. Negative for arthralgias,  flank pain, gait problem and myalgias.  Skin: Negative for itching and rash.  Neurological: Negative for dizziness, gait problem, headaches, light-headedness, numbness and seizures.  Hematological: Negative for adenopathy. Does not bruise/bleed easily.  Psychiatric/Behavioral: Negative for confusion and decreased concentration. The patient is not nervous/anxious.     MEDICAL HISTORY:  Past Medical History:  Diagnosis Date  . Anxiety   . [place] ([place]   . COPD (chronic obstructive pulmonary disease) ([place]   . Diabetes mellitus without complication ([place]    type 2  . GERD (gastroesophageal reflux disease)   . Hyperlipidemia     SURGICAL HISTORY: Past Surgical History:  Procedure Laterality Date  . APPENDECTOMY    . ENDOBRONCHIAL ULTRASOUND Bilateral 06/13/2019   Procedure: ENDOBRONCHIAL ULTRASOUND, BILATERAL;  Surgeon: RLaverle Hobby MD;  Location: [facility] ORS;  Service: Pulmonary;  Laterality: Bilateral;  . EYE SURGERY Bilateral    cataract    SOCIAL HISTORY: Social History   Socioeconomic History  . Marital status: Widowed    Spouse name: Not on file  . Number of children: 1  . Years of education: Not on file  . Highest education level: High school graduate  Occupational History  . Occupation: retired  Tobacco Use  . Smoking status: Current Every Day Smoker    Packs/day: 1.00    Types: Cigarettes  . Smokeless tobacco: Former [employer] developer   Types: Chew    Quit date: 06/10/1959  . Tobacco comment: 1 pack daily- 06/09/2019  Vaping Use  . Vaping Use: Never used  Substance and Sexual Activity  . Alcohol use: No  . Drug use: No  . Sexual activity: Not Currently  Other Topics Concern  . Not on file  Social History Narrative  . Not on file   Social Determinants of Health   Financial Resource Strain: Not on file  Food Insecurity: Not on file  Transportation Needs: Not on file  Physical Activity: Not on file  Stress: Not on file  Social Connections: Not on file   Intimate Partner Violence: Not on file    FAMILY HISTORY: Family History  Problem Relation Age of Onset  . Cancer Mother        lung (non smoker)  . Diabetes Father   . Hypertension Father   . Lymphoma Brother   . Prostate cancer Brother     ALLERGIES:  is allergic to penicillins.  MEDICATIONS:  Current Outpatient Medications  Medication Sig Dispense Refill  . albuterol (PROAIR HFA) 108 (90 Base) MCG/ACT inhaler Inhale 1-2 puffs into the lungs every 6 (six) hours as needed for wheezing or shortness of breath (use as needed for chest congestion.). 1 g 3  . aspirin EC 81 MG tablet Take 81 mg by mouth daily.    . citalopram (CELEXA) 20 MG tablet Take 1 tablet (20 mg total) by mouth daily. 90 tablet 4  . Dextromethorphan-guaiFENesin (ROBITUSSIN DM PO) Take 5 mLs by mouth at bedtime.     . fludrocortisone (FLORINEF) 0.1 MG tablet Take 100 mcg by mouth daily.    Marland Kitchen GILOTRIF 20 MG tablet Take 20 mg by mouth daily.    Marland Kitchen glucose blood (ONE TOUCH ULTRA TEST) test strip 1 each by Other route 2 (two) times daily as needed for other. Dx: E11.9 100 each 12  . HUMALOG KWIKPEN 100 UNIT/ML KwikPen 10 Units. Sliding scale up to 12 units    . LANTUS SOLOSTAR 100 UNIT/ML Solostar Pen INJECT 10-20 UNITS INTO THE SKIN DAILY (Patient taking differently: Inject 14 Units into the skin daily.) 5 pen 12  . loperamide (IMODIUM) 2 MG capsule Take 1 capsule (2 mg total) by mouth See admin instructions. Initial: 4 mg, followed by 2 mg after each loose stool; maximum: 16 mg/day 90 capsule 0  . metFORMIN (GLUCOPHAGE) 500 MG tablet TAKE ONE TABLET BY MOUTH TWICE DAILY (Patient taking differently: Take 1,000 mg by mouth daily with supper.) 60 tablet 12  . Multiple Vitamin (MULTIVITAMIN) tablet Take 1 tablet by mouth daily.     Marland Kitchen NOVOFINE 32G X 6 MM MISC USE AS DIRECTED. 5 INJECTIONS A DAY 900 each 0  . megestrol (MEGACE) 400 MG/10ML suspension Take 10 mLs (400 mg total) by mouth daily. (Patient not taking: No sig  reported) 240 mL 0  . Morphine Sulfate (MORPHINE CONCENTRATE) 10 mg / 0.5 ml concentrated solution Take 0.5 mLs (10 mg total) by mouth every 6 (six) hours as needed for moderate pain or severe pain. (Patient not taking: Reported on 12/06/2020) 30 mL 0   No current facility-administered medications for this visit.     PHYSICAL EXAMINATION: ECOG PERFORMANCE STATUS: 2 - Symptomatic, <50% confined to bed Vitals:   12/06/20 1047  BP: 95/62  Pulse: (!) 111  Resp: 18  Temp: 97.8 F (36.6 C)  SpO2: 95%   Filed Weights   12/06/20 1047  Weight: 101 lb 6.4 oz (46 kg)    Physical Exam Constitutional:      General: He is not in acute distress.    Appearance: He is not ill-appearing.     Comments: Frail and cachectic  HENT:     Head: Normocephalic and atraumatic.  Eyes:  General: No scleral icterus.    Pupils: Pupils are equal, round, and reactive to light.  Cardiovascular:     Rate and Rhythm: Normal rate and regular rhythm.     Heart sounds: Normal heart sounds.  Pulmonary:     Effort: Pulmonary effort is normal. No respiratory distress.     Breath sounds: No wheezing.     Comments: Decreased breath sounds bilaterally.  Abdominal:     General: Bowel sounds are normal. There is no distension.     Palpations: Abdomen is soft. There is no mass.  Musculoskeletal:        General: No deformity. Normal range of motion.     Cervical back: Normal range of motion and neck supple.     Comments: Clubbing  Skin:    General: Skin is warm and dry.     Findings: No erythema or rash.  Neurological:     Mental Status: He is alert and oriented to person, place, and time. Mental status is at baseline.     Cranial Nerves: No cranial nerve deficit.     Coordination: Coordination normal.  Psychiatric:        Mood and Affect: Mood normal.     LABORATORY DATA:  I have reviewed the data as listed Lab Results  Component Value Date   WBC 6.0 12/06/2020   HGB 15.1 12/06/2020   HCT 41.9  12/06/2020   MCV 94.8 12/06/2020   PLT 241 12/06/2020   Recent Labs    01/16/20 0942 07/24/20 1104 07/26/20 1001 07/31/20 1059 11/15/20 1315 11/22/20 0951 12/06/20 0946  NA 132*  --  130*   < > 128* 132* 136  K 4.9  --  4.4   < > 3.9 4.6 3.7  CL 98  --  92*   < > 94* 97* 96*  CO2 24  --  26   < > 24 29 29   GLUCOSE 460*  --  395*   < > 588* 343* 202*  BUN 13  --  15   < > 17 9 12   CREATININE 0.80   < > 0.77   < > 0.74 0.53* 0.52*  CALCIUM 9.3  --  9.0   < > 8.9 8.7* 8.9  GFRNONAA >60  --  >60   < > >60 >60 >60  GFRAA >60  --  >60  --   --   --   --   PROT 7.7  --  7.1   < > 6.2* 6.7 6.9  ALBUMIN 4.4  --  4.0   < > 3.4* 3.4* 3.3*  AST 30  --  15   < > 19 18 16   ALT 33  --  12   < > 14 16 15   ALKPHOS 110  --  93   < > 93 95 102  BILITOT 0.9  --  1.0   < > 0.6 0.6 0.9   < > = values in this interval not displayed.   Iron/TIBC/Ferritin/ %Sat No results found for: IRON, TIBC, FERRITIN, IRONPCTSAT    RADIOGRAPHIC STUDIES: I have personally reviewed the radiological images as listed and agreed with the findings in the report.  US Venous Img Lower Unilateral Left  Result Date: 11/20/2020 CLINICAL DATA:  LEFT leg pain and swelling for 5 days question blood clots EXAM: LEFT LOWER EXTREMITY VENOUS DOPPLER ULTRASOUND TECHNIQUE: Gray-scale sonography with compression, as well as color and duplex ultrasound, were performed to evaluate the deep venous system(s)  from the level of the common femoral vein through the popliteal and proximal calf veins. COMPARISON:  None FINDINGS: VENOUS Normal compressibility of the common femoral, superficial femoral, and popliteal veins, as well as the visualized calf veins. Visualized portions of profunda femoral vein and great saphenous vein unremarkable. No filling defects to suggest DVT on grayscale or color Doppler imaging. Doppler waveforms show normal direction of venous flow, normal respiratory plasticity and response to augmentation. Limited views of  the contralateral common femoral vein are unremarkable. OTHER None. Limitations: none IMPRESSION: No evidence of deep venous thrombosis in the LEFT lower extremity. Electronically Signed   By: Lavonia Dana M.D.   On: 11/20/2020 13:58      ASSESSMENT & PLAN:  1. NSCLC with EGFR mutation ([place])   2. Protein-calorie malnutrition, unspecified severity ([place])   3. 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  NGS EGFR G719S+, [id number], PD-L1 30% Patient does not tolerate afatinib. Discussed with him during last visit.  Decision was made to continue supportive care. Patient appears to have declined gradually.  Goals of care was discussed with patient.  Patient desires to pursue with some treatment. Repeat CT scan in late February or early March for further evaluation of disease status. He agrees with the plan.   #Severe protein calorie malnutrition.  Poor oral intake continue nutrition supplementation. Follow-up with palliative care service.  Follow-up 2 weeks. All questions were answered. The patient knows to call the clinic with any problems questions or concerns.  Earlie Server, MD, PhD Hematology Oncology [facility] Pager- [phone] 12/06/2020

## 2020-12-06 NOTE — Progress Notes (Signed)
Pt received 1 liter of 0.9% NS. Tolerated well. Ate chips and drank water. No complaints at time of discharge.

## 2020-12-06 NOTE — Progress Notes (Signed)
[place]  [phone] [phone]   Name: Colin Novak Date: 12/06/2020 MRN: [id number]  DOB: [birth year]  Patient Care Team: Guadalupe Maple, MD as PCP - General (Family Medicine) Solum, Betsey Holiday, MD as Physician Assistant (Endocrinology) Kem Parkinson, MD (Ophthalmology) Telford Nab, RN as Registered Nurse Tamala Julian, Jonette Eva, NP as Nurse Practitioner (Hospice and Palliative Medicine) Earlie Server, MD as Consulting Physician (Oncology) Kemauri Musa, Kirt Boys, NP as Referring Physician (Hospice and Palliative Medicine)    REASON FOR CONSULTATION: Palliative Care consult requested for this 75 y.o. male with multiple medical problems including stage IV non-small cell lung cancer who initially refused chemotherapy/immunotherapy but received palliative XRT to the right upper lobe as mass is obstructing his bronchus.    PET scan on 08/02/2020 revealed disease progression in the lungs.  Patient was started on afatinib.  Patient was referred to palliative care to help address goals and manage ongoing symptoms.  SOCIAL HISTORY:     reports that he has been smoking cigarettes. He has been smoking about 1.00 pack per day. He quit smokeless tobacco use about 61 years ago.  His smokeless tobacco use included chew. He reports that he does not drink alcohol and does not use drugs.   Patient is a widower.  He lives at home alone with his dog.  He has a daughter who is involved in his care.  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  [facility] DIRECTIVES:  Not on file  CODE STATUS: DNR (DNR form signed on 09/01/2019 and again on 08/09/2020)  PAST MEDICAL HISTORY: Past Medical History:  Diagnosis Date  . Anxiety   . Cancer ([place])   . COPD (chronic obstructive pulmonary disease) ([place])   . Diabetes mellitus without complication ([place])    type 2  . GERD (gastroesophageal reflux disease)   . Hyperlipidemia      PAST SURGICAL HISTORY:  Past Surgical History:  Procedure Laterality Date  . APPENDECTOMY    . ENDOBRONCHIAL ULTRASOUND Bilateral 06/13/2019   Procedure: ENDOBRONCHIAL ULTRASOUND, BILATERAL;  Surgeon: [name], MD;  Location: [facility] ORS;  Service: Pulmonary;  Laterality: Bilateral;  . EYE SURGERY Bilateral    cataract    HEMATOLOGY/ONCOLOGY HISTORY:  Oncology History  Malignant neoplasm of lung ([place])  06/20/2019 Initial Diagnosis   Non-small cell lung cancer ([place])   01/16/2020 - 01/16/2020 Chemotherapy   The patient had dexamethasone (DECADRON) 4 MG tablet, 0 of 1 cycle, Start date: --, End date: -- palonosetron (ALOXI) injection 0.25 mg, 0.25 mg, Intravenous,  Once, 0 of 4 cycles PEMEtrexed (ALIMTA) 800 mg in sodium chloride 0.9 % 100 mL chemo infusion, 500 mg/m2, Intravenous,  Once, 0 of 4 cycles CARBOplatin (PARAPLATIN) in sodium chloride 0.9 % 100 mL chemo infusion, , Intravenous,  Once, 0 of 4 cycles fosaprepitant (EMEND) 150 mg in sodium chloride 0.9 % 145 mL IVPB, 150 mg, Intravenous,  Once, 0 of 4 cycles pembrolizumab (KEYTRUDA) 200 mg in sodium chloride 0.9 % 50 mL chemo infusion, 200 mg (original dose ), Intravenous, Once, 0 of 4 cycles Dose modification: 200 mg (Cycle 1, Reason: Other (see comments))  for chemotherapy treatment.    01/23/2020 - 01/23/2020 Chemotherapy   The patient had dexamethasone (DECADRON) 4 MG tablet, 1 of 1 cycle, Start date: 06/20/2019, End date: 01/16/2020 palonosetron (ALOXI) injection 0.25 mg, 0.25 mg, Intravenous,  Once, 0 of 4 cycles PEMEtrexed (ALIMTA) 775 mg in sodium chloride 0.9 %  100 mL chemo infusion, 500 mg/m2, Intravenous,  Once, 0 of 4 cycles CARBOplatin (PARAPLATIN) in sodium chloride 0.9 % 100 mL chemo infusion, , Intravenous,  Once, 0 of 4 cycles fosaprepitant (EMEND) 150 mg, dexamethasone (DECADRON) 12 mg in sodium chloride 0.9 % 145 mL IVPB, , Intravenous,  Once, 0 of 4 cycles  for chemotherapy treatment.    07/26/2020 -  07/26/2020 Chemotherapy   The patient had [No matching medication found in this treatment plan]  for chemotherapy treatment.    10/05/2020 -  Chemotherapy    Patient is on Treatment Plan: LUNG NSCLC OSIMERTINIB Q28D        ALLERGIES:  is allergic to penicillins.  MEDICATIONS:  Current Outpatient Medications  Medication Sig Dispense Refill  . albuterol (PROAIR HFA) 108 (90 Base) MCG/ACT inhaler Inhale 1-2 puffs into the lungs every 6 (six) hours as needed for wheezing or shortness of breath (use as needed for chest congestion.). 1 g 3  . aspirin EC 81 MG tablet Take 81 mg by mouth daily.    . citalopram (CELEXA) 20 MG tablet Take 1 tablet (20 mg total) by mouth daily. 90 tablet 4  . Dextromethorphan-guaiFENesin (ROBITUSSIN DM PO) Take 5 mLs by mouth at bedtime.     . fludrocortisone (FLORINEF) 0.1 MG tablet Take 100 mcg by mouth daily.    Marland Kitchen GILOTRIF 20 MG tablet Take 20 mg by mouth daily.    Marland Kitchen glucose blood (ONE TOUCH ULTRA TEST) test strip 1 each by Other route 2 (two) times daily as needed for other. Dx: E11.9 100 each 12  . HUMALOG KWIKPEN 100 UNIT/ML KwikPen 10 Units. Sliding scale up to 12 units    . LANTUS SOLOSTAR 100 UNIT/ML Solostar Pen INJECT 10-20 UNITS INTO THE SKIN DAILY (Patient taking differently: Inject 14 Units into the skin daily.) 5 pen 12  . loperamide (IMODIUM) 2 MG capsule Take 1 capsule (2 mg total) by mouth See admin instructions. Initial: 4 mg, followed by 2 mg after each loose stool; maximum: 16 mg/day 90 capsule 0  . megestrol (MEGACE) 400 MG/10ML suspension Take 10 mLs (400 mg total) by mouth daily. (Patient not taking: No sig reported) 240 mL 0  . metFORMIN (GLUCOPHAGE) 500 MG tablet TAKE ONE TABLET BY MOUTH TWICE DAILY (Patient taking differently: Take 1,000 mg by mouth daily with supper.) 60 tablet 12  . Morphine Sulfate (MORPHINE CONCENTRATE) 10 mg / 0.5 ml concentrated solution Take 0.5 mLs (10 mg total) by mouth every 6 (six) hours as needed for moderate  pain or severe pain. (Patient not taking: Reported on 12/06/2020) 30 mL 0  . Multiple Vitamin (MULTIVITAMIN) tablet Take 1 tablet by mouth daily.     Marland Kitchen NOVOFINE 32G X 6 MM MISC USE AS DIRECTED. 5 INJECTIONS A DAY 900 each 0   No current facility-administered medications for this visit.    VITAL SIGNS: There were no vitals taken for this visit. There were no vitals filed for this visit.  Estimated body mass index is 15.42 kg/m as calculated from the following:   Height as of 09/27/20: $RemoveBef'5\' 8"'vykkDKKMbO$  (1.727 m).   Weight as of an earlier encounter on 12/06/20: 101 lb 6.4 oz (46 kg).  LABS: CBC:    Component Value Date/Time   WBC 6.0 12/06/2020 0946   HGB 15.1 12/06/2020 0946   HGB 13.2 12/06/2018 0857   HCT 41.9 12/06/2020 0946   HCT 38.6 12/06/2018 0857   PLT 241 12/06/2020 0946   PLT 266 12/06/2018  0857   MCV 94.8 12/06/2020 0946   MCV 96 12/06/2018 0857   NEUTROABS 4.8 12/06/2020 0946   NEUTROABS 1.7 12/06/2018 0857   LYMPHSABS 0.6 (L) 12/06/2020 0946   LYMPHSABS 1.6 12/06/2018 0857   MONOABS 0.6 12/06/2020 0946   EOSABS 0.0 12/06/2020 0946   EOSABS 0.1 12/06/2018 0857   BASOSABS 0.0 12/06/2020 0946   BASOSABS 0.1 12/06/2018 0857   Comprehensive Metabolic Panel:    Component Value Date/Time   NA 136 12/06/2020 0946   NA 140 12/06/2018 0857   K 3.7 12/06/2020 0946   CL 96 (L) 12/06/2020 0946   CO2 29 12/06/2020 0946   BUN 12 12/06/2020 0946   BUN 9 12/06/2018 0857   CREATININE 0.52 (L) 12/06/2020 0946   GLUCOSE 202 (H) 12/06/2020 0946   CALCIUM 8.9 12/06/2020 0946   AST 16 12/06/2020 0946   AST 55 (H) 04/01/2017 0907   ALT 15 12/06/2020 0946   ALT 48 (H) 04/01/2017 0907   ALKPHOS 102 12/06/2020 0946   BILITOT 0.9 12/06/2020 0946   BILITOT <0.2 12/06/2018 0857   PROT 6.9 12/06/2020 0946   PROT 6.4 12/06/2018 0857   ALBUMIN 3.3 (L) 12/06/2020 0946   ALBUMIN 4.1 12/06/2018 0857    RADIOGRAPHIC STUDIES: US Venous Img Lower Unilateral Left  Result Date:  11/20/2020 CLINICAL DATA:  LEFT leg pain and swelling for 5 days question blood clots EXAM: LEFT LOWER EXTREMITY VENOUS DOPPLER ULTRASOUND TECHNIQUE: Gray-scale sonography with compression, as well as color and duplex ultrasound, were performed to evaluate the deep venous system(s) from the level of the common femoral vein through the popliteal and proximal calf veins. COMPARISON:  None FINDINGS: VENOUS Normal compressibility of the common femoral, superficial femoral, and popliteal veins, as well as the visualized calf veins. Visualized portions of profunda femoral vein and great saphenous vein unremarkable. No filling defects to suggest DVT on grayscale or color Doppler imaging. Doppler waveforms show normal direction of venous flow, normal respiratory plasticity and response to augmentation. Limited views of the contralateral common femoral vein are unremarkable. OTHER None. Limitations: none IMPRESSION: No evidence of deep venous thrombosis in the LEFT lower extremity. Electronically Signed   By: Lavonia Dana M.D.   On: 11/20/2020 13:58    PERFORMANCE STATUS (ECOG) : 1 - Symptomatic but completely ambulatory  Review of Systems Unless otherwise noted, a complete review of systems is negative.  Physical Exam General: NAD, frail appearing, thin Pulmonary: Unlabored Extremities: no edema Skin: no rashes Neurological: Weakness but otherwise nonfocal  IMPRESSION: Patient was an add-on to my clinic schedule today at Dr. Collie Siad request.  Patient was seen while he was receiving IV fluids.  He is increasingly frail and weak.  Oral intake remains poor despite discontinuation chemotherapy.  I met with patient today to discuss general goals.  He says that he is undecided on whether he wishes to pursue further chemotherapy versus supportive/comfort care.  There has been some discussion about repeat imaging in the next month and patient would like to wait until then before making any decisions.  I called and  spoke with patient's daughter.  She feels that patient is declining.  She asked about hospice care, which we discussed in detail.  She is exploring options for getting patient additional help in the home.  She requested DME including a Rollator walker and bedside commode.     Durable Medical Equipment  (From admission, onward)         Start  Ordered   12/06/20 0000  For home use only DME 4 wheeled rolling walker with seat       Question:  Patient needs a walker to treat with the following condition  Answer:  Weakness   12/06/20 1434   12/06/20 0000  DME Bedside commode       Question:  Patient needs a bedside commode to treat with the following condition  Answer:  Weakness   12/06/20 1434         [facility] for DME  PLAN: -Best supportive care -Patient considering treatment options -Discussed option for hospice with family -We will refer again to home-based palliative care -DME: Walker with seat and bedside commode -RTC 2 weeks   Patient expressed understanding and was in agreement with this plan. He also understands that He can call the clinic at any time with any questions, concerns, or complaints.     Time Total: 30 minutes  Visit consisted of counseling and education dealing with the complex and emotionally intense issues of symptom management and palliative care in the setting of serious and potentially life-threatening illness.Greater than 50%  of this time was spent counseling and coordinating care related to the above assessment and plan.  Signed by: Altha Harm, PhD, NP-C

## 2020-12-07 ENCOUNTER — Telehealth: Payer: Self-pay | Admitting: Primary Care

## 2020-12-07 ENCOUNTER — Telehealth: Payer: Self-pay

## 2020-12-07 ENCOUNTER — Telehealth: Payer: Self-pay | Admitting: *Deleted

## 2020-12-07 DIAGNOSIS — C3480 Malignant neoplasm of overlapping sites of unspecified bronchus and lung: Secondary | ICD-10-CM

## 2020-12-07 DIAGNOSIS — M6281 Muscle weakness (generalized): Secondary | ICD-10-CM | POA: Diagnosis not present

## 2020-12-07 NOTE — Telephone Encounter (Signed)
Called to set up time for home visit next week. Options given, message left.

## 2020-12-07 NOTE — Telephone Encounter (Signed)
Spoke with pt's daughter on 12/06/20 who stated they are trying to find home care services for patient that is affordable. Options given for home care providers, [facility], and [facility]. Informed daughter that these services are not covered under medical insurance and that there will most likely be an out of pocket expense. Pt previously referred to home health with [facility] but daughter states they only came to see pt once and didn't make any additional visits. Per [facility], PT recommended further visits but pt declined any further visits after initial evaluation. Pt's daughter made aware.   Pt's daughter states that she has been thinking about hospice as an option for patient but would like to discuss further with him. Pt scheduled for home visit with [facility] next week and will discuss further with him about hospice as well.   Pt's daughter instructed to call back if needs any further assistance with hospice or home care services.

## 2020-12-07 NOTE — Telephone Encounter (Signed)
-----   Message from Earlie Server, MD sent at 12/06/2020  7:40 PM EST ----- Please arrange him to get CT chest abdomen pelvis w contrast. I discussed with him during today's visit. Thank you

## 2020-12-07 NOTE — Telephone Encounter (Addendum)
Patient may be pursuing hospice due to decline in health status. Colin Novak will let us know if pt will proceed with hospice once he confirms, so we can cancel scan.

## 2020-12-07 NOTE — Telephone Encounter (Signed)
Done   CT has be sched as requested.  I was unable to reach pt by ph to make him aware of the shed date and time...however, I'll try giving him a call again later A NEW appt reminder letter will be mailed out

## 2020-12-07 NOTE — Telephone Encounter (Signed)
Please schedule CT- next avail and notify pt of appt. Dr. Tasia Catchings has discussed with pt about CT scan.

## 2020-12-12 ENCOUNTER — Other Ambulatory Visit: Payer: Self-pay

## 2020-12-12 ENCOUNTER — Other Ambulatory Visit: Payer: Medicare Other | Admitting: Primary Care

## 2020-12-12 DIAGNOSIS — J42 Unspecified chronic bronchitis: Secondary | ICD-10-CM

## 2020-12-12 DIAGNOSIS — E119 Type 2 diabetes mellitus without complications: Secondary | ICD-10-CM | POA: Diagnosis not present

## 2020-12-12 DIAGNOSIS — Z794 Long term (current) use of insulin: Secondary | ICD-10-CM

## 2020-12-12 DIAGNOSIS — Z515 Encounter for palliative care: Secondary | ICD-10-CM | POA: Diagnosis not present

## 2020-12-12 DIAGNOSIS — C3490 Malignant neoplasm of unspecified part of unspecified bronchus or lung: Secondary | ICD-10-CM | POA: Diagnosis not present

## 2020-12-12 NOTE — Progress Notes (Signed)
[facility] Consult Note Telephone: [phone]  Fax: [phone]     Date of encounter: 12/12/20 PATIENT NAME: Colin Novak [id number] [id number] [phone] (home)  DOB: 08/03/1946 MRN: [id number]  PRIMARY CARE PROVIDER:    Charlynne Cousins, MD [place] [address] [phone]   REFERRING PROVIDER:    Irean Hong, NP [place],  [place] [address]  [phone]    RESPONSIBLE PARTY:   Extended Emergency Contact Information Primary Emergency Contact: Chandra Batch Mobile Phone: [phone] Relation: Daughter Secondary Emergency Contact: [facility] Mobile Phone: [phone] Relation: Sister Interpreter needed? No  I met face to face with patient  in  home. Palliative Care was asked to follow this patient by consultation request of Billey Chang, NP to help address advance care planning and goals of care. This is a follow up visit.  ASSESSMENT AND RECOMMENDATIONS:   1. Advance Care Planning/Goals of Care: Goals include to maximize quality of life and symptom management. Our advance care planning conversation included a discussion about:     The value and importance of advance care planning   Exploration of personal, cultural or spiritual beliefs that might influence medical decisions   Exploration of goals of care in the event of a sudden injury or illness   Exploration of disease treating options vis a vis comfort/supportive measures  Identification and preparation of a healthcare agent - [name] daughter  Review of an advance directive document  and level of care desired  DNR on record and in home.  Decision to de escalate treatment oriented care due to poor prognosis, and refer to hospice.  I spent 50 minutes providing this consultation,  from 1430 to 1520. More than 50% of the time in this consultation was spent coordinating  communication. ____________________________________________________________________________  2. Symptom Management:   Hyperglycemia: Patient has had uncontrolled hyperglycemia but endorses non compliance with po meds as well as insulin.  Patient endorses continuous high BGs in 400-600's. He has not yet taken his BG yet today,  Has not taken meds, and he has not eaten today at 4 pm. He does take our BG during our visit today- BG 344  He endorses frequently forgetting his insulin or taking it after he eats. Med box set up with metformin  Extended release 500 mg bid, holding actos until compliant with metformin. Has lantus 14 u and meal time insulin. He is unable to negotiate this complex of a regimen so recommend to simplify with a mixed insulin.  Endocrine  has recently changed his regimen and he has been put on oral antidiabetic medications (Actos and metformin). We discussed that getting better control of his BG would likely help with dehydration. He will likely be able to have glucose managed by home visiting PCP  NP once he is on hospice services in the home. Since he has been so erratic about medication taking, I recommend observed dosing for a few weeks.  Med compliance; said he was compliant and using a med box, but could not produce one. Then said he was not taking his meds. Recommended medication box- pt is not taking medications a prescribed, discussed caregiver support around meals/medication time.  Daughter to obtain a med box and fill with current med list, per chart update today. Glucose control is possible in my opinion with support and cueing.  Care management: Recommend f/u with PCP. Has not seen a new provider since his MD  retired 2 years ago.  Patient is interested in home based primary care. Will reach out to local home based NP, Alvester Chou and make referral.   Stomatitis: Pt endorses stopping chemotherapeutic due to oral pain. He states this is healing now and he is eating better.    Pain: States it is tolerable and does not want to treat more aggressively. I recommend to monitor and be proactive if pain worsens due to progression of disease. I also recommend 1000 mg acetaminophen bid for arthritic pain in L knee. Pt agrees and I have asked daughter to put in med box.   Loss of appetite: Family concerned because they are not sure patient is eating when they are not here. He does not have much of an appetite. Family tries to keep easy things in the fridge for patient to eat. We also discussed hired caregivers for a couple hours in the morning for breakfast/lunch/ADL support. Discussed ensure protein drinks, which he is agreeable to try. Hopeful that appetite will be improved since pt stopped chemo/stomatitis has improved. He is also edentulous and gums are tender. We discussed smoothies with protein, peanut butter. Nutrition is a serious challenge for him.  3. Follow up Palliative Care Visit: Refer to hospice and d/c from palliative. Can continue to follow for pc if not admitted to hospice.  4. Family /Caregiver/Community Supports: Lives in own home alone with family nearby. They have a schedule with family who comes in multiple times a week. They have started to explore further caregivers/companions options for the future.   5. Cognitive / Functional decline: A and O x 3,  needs assistance with all  iadls and most adls.  CODE STATUS:DNR  PPS: 40%  HOSPICE ELIGIBILITY/DIAGNOSIS: yes/ Lung cancer  Subjective:  CHIEF COMPLAINT: stopped cancer treatment. Pt and family requesting hospice, anorexia, hyperglycemia  HISTORY OF PRESENT ILLNESS:  Colin Novak is a 75 y.o. year old male  with NSCLC, DM with uncontrolled hyperglycemia,  And anorexia. Has forgotten to take insulin on many occasions. He has stopped chemo for his cancer, due to side effects  Of oral pain making intake difficult. He is seen today with his family. He does not feel that chemo side effects are tolerable  at this point, and does not plan to resume any treatments at this time. Due to decision to de escalate, pt requests to d/c appt for scan and any more oncology treatment fu. He does endorse not taking his  medicines on many occasions and raises question of more care giving oversight needed at this time. His intake is poor in context of advancing disease and poor glycemic control and oral pain. Tylenol occasionally helps but his decision to stop oral chemos is also due to pain management needs. Family is working on adequate nutritional options.  We are asked to consult around advance care planning and complex medical decision making.    Review and summarization of old Epic records shows or history from other than patient- recent oncology progress note.  History obtained from review of EMR, discussion with primary team, and  interview with family, caregiver  and/or Mr. Wack. Records reviewed and summarized above.   CURRENT PROBLEM LIST:  Patient Active Problem List   Diagnosis Date Noted  . Hypoglycemia 09/27/2020  . Encounter for antineoplastic chemotherapy 08/09/2020  . NSCLC with EGFR mutation ([place]) 08/09/2020  . Hypotension due to hypovolemia 07/26/2020  . Hyponatremia 07/26/2020  . Type 2 diabetes mellitus without complication, with long-term current use  of insulin ([place]) 02/03/2020  . Malignant neoplasm of overlapping sites of lung ([place]) 01/16/2020  . Goals of care, counseling/discussion 06/20/2019  . Protein-calorie malnutrition ([place]) 06/20/2019  . Malignant neoplasm of lung ([place]) 06/20/2019  . Moderate protein-calorie malnutrition ([place]) 05/24/2018  . Tobacco dependence 05/24/2018  . Underweight 05/24/2018  . Weight loss, unintentional 05/24/2018  . Edema extremities 11/19/2017  . Depression, major, single episode, moderate ([place]) 11/19/2017  . Advanced care planning/counseling discussion 11/19/2017  . Senile purpura ([place]) 04/01/2017  . COPD (chronic obstructive pulmonary disease)  ([place]) 07/24/2015  . Malnutrition ([place]) 07/24/2015  . Skin lesion 07/24/2015  . BPH (benign prostatic hyperplasia) 07/24/2015  . Diabetes mellitus associated with hormonal etiology ([place]) 04/11/2015  . Hyperlipidemia 04/11/2015   PAST MEDICAL HISTORY:  Active Ambulatory Problems    Diagnosis Date Noted  . Diabetes mellitus associated with hormonal etiology ([place]) 04/11/2015  . Hyperlipidemia 04/11/2015  . COPD (chronic obstructive pulmonary disease) ([place]) 07/24/2015  . Malnutrition ([place]) 07/24/2015  . Skin lesion 07/24/2015  . BPH (benign prostatic hyperplasia) 07/24/2015  . Senile purpura ([place]) 04/01/2017  . Edema extremities 11/19/2017  . Depression, major, single episode, moderate ([place]) 11/19/2017  . Advanced care planning/counseling discussion 11/19/2017  . Moderate protein-calorie malnutrition ([place]) 05/24/2018  . Tobacco dependence 05/24/2018  . Underweight 05/24/2018  . Weight loss, unintentional 05/24/2018  . Goals of care, counseling/discussion 06/20/2019  . Protein-calorie malnutrition ([place]) 06/20/2019  . Malignant neoplasm of lung ([place]) 06/20/2019  . Malignant neoplasm of overlapping sites of lung ([place]) 01/16/2020  . Type 2 diabetes mellitus without complication, with long-term current use of insulin ([place]) 02/03/2020  . Hypotension due to hypovolemia 07/26/2020  . Hyponatremia 07/26/2020  . Encounter for antineoplastic chemotherapy 08/09/2020  . NSCLC with EGFR mutation ([place]) 08/09/2020  . Hypoglycemia 09/27/2020   Resolved Ambulatory Problems    Diagnosis Date Noted  . Uncontrolled type 2 diabetes mellitus with hyperglycemia, with long-term current use of insulin ([place]) 01/29/2017   Past Medical History:  Diagnosis Date  . Anxiety   . Cancer ([place])   . Diabetes mellitus without complication ([place])   . GERD (gastroesophageal reflux disease)    SOCIAL HX:  Social History   Tobacco Use  . Smoking status: Current Every Day Smoker    Packs/day: 1.00    Types:  Cigarettes  . Smokeless tobacco: Former Systems developer    Types: Chew    Quit date: 06/10/1959  . Tobacco comment: 1 pack daily- 06/09/2019  Substance Use Topics  . Alcohol use: No   FAMILY HX:  Family History  Problem Relation Age of Onset  . Cancer Mother        lung (non smoker)  . Diabetes Father   . Hypertension Father   . Lymphoma Brother   . Prostate cancer Brother       ALLERGIES:  Allergies  Allergen Reactions  . Penicillins Rash     PERTINENT MEDICATIONS: . Outpatient Encounter Medications as of 12/12/2020  Medication Sig  . acetaminophen (TYLENOL) 500 MG tablet Take 1,000 mg by mouth every 12 (twelve) hours.  Marland Kitchen albuterol (PROAIR HFA) 108 (90 Base) MCG/ACT inhaler Inhale 1-2 puffs into the lungs every 6 (six) hours as needed for wheezing or shortness of breath (use as needed for chest congestion.).  Marland Kitchen aspirin EC 81 MG tablet Take 81 mg by mouth daily.  Marland Kitchen Dextromethorphan-guaiFENesin (ROBITUSSIN DM PO) Take 5 mLs by mouth at bedtime.   Marland Kitchen FAMOTIDINE MAXIMUM STRENGTH PO Take 20 mg by mouth daily.  Marland Kitchen  fludrocortisone (FLORINEF) 0.1 MG tablet Take 100 mcg by mouth daily.  Marland Kitchen glucose blood (ONE TOUCH ULTRA TEST) test strip 1 each by Other route 2 (two) times daily as needed for other. Dx: E11.9  . HUMALOG KWIKPEN 100 UNIT/ML KwikPen 14 Units. Sliding scale up to 12 units  . LANTUS SOLOSTAR 100 UNIT/ML Solostar Pen INJECT 10-20 UNITS INTO THE SKIN DAILY (Patient taking differently: Inject 14 Units into the skin daily.)  . megestrol (MEGACE) 400 MG/10ML suspension Take 10 mLs (400 mg total) by mouth daily.  . metFORMIN (GLUCOPHAGE) 500 MG tablet TAKE ONE TABLET BY MOUTH TWICE DAILY (Patient taking differently: Take 1,000 mg by mouth daily with supper.)  . Multiple Vitamin (MULTIVITAMIN) tablet Take 1 tablet by mouth daily.   Marland Kitchen NOVOFINE 32G X 6 MM MISC USE AS DIRECTED. 5 INJECTIONS A DAY  . citalopram (CELEXA) 20 MG tablet Take 1 tablet (20 mg total) by mouth daily. (Patient not taking:  Reported on 12/12/2020)  . loperamide (IMODIUM) 2 MG capsule Take 1 capsule (2 mg total) by mouth See admin instructions. Initial: 4 mg, followed by 2 mg after each loose stool; maximum: 16 mg/day (Patient not taking: Reported on 12/12/2020)  . Morphine Sulfate (MORPHINE CONCENTRATE) 10 mg / 0.5 ml concentrated solution Take 0.5 mLs (10 mg total) by mouth every 6 (six) hours as needed for moderate pain or severe pain. (Patient not taking: No sig reported)  . [DISCONTINUED] GILOTRIF 20 MG tablet Take 20 mg by mouth daily.  . [DISCONTINUED] prochlorperazine (COMPAZINE) 10 MG tablet Take 1 tablet (10 mg total) by mouth every 6 (six) hours as needed (Nausea or vomiting). (Patient not taking: Reported on 01/13/2020)   No facility-administered encounter medications on file as of 12/12/2020.     Objective: ROS  General: weakness EYES: denies vision changes, wears glasses ENMT: endorses dysphagia and pain due to chemotherapy  Cardiovascular: denies chest pain Pulmonary: endorses cough, denies increased SOB Abdomen: endorses poor appetite,endorses continence of bowel GU: denies dysuria, endorses continence of urine, endorses nocturia and frequency at hs MSK:  endorses ROM limitations, no falls reported Skin: denies rashes or wounds Neurological:  Endorses pain, denies insomnia Psych: Endorses withdrawn  mood   Physical Exam: Current and past weights:  101 lbs, 117 lbs 6 months ago, 10% weight loss BMI 15 Constitutional: BG 344 this afternoon during visit, ranges from 250-600. General: frail appearing, cachectic EYES: anicteric sclera,lids intact, no discharge  ENMT:slight hard of  hearing,oral mucous membranes moist, edentulous CV: S1S2, RRR, no LE edema Pulmonary: exp. Wheezes  throughout all fields,  + increased work of breathing, ++ cough, room air, daily smoker Abdomen: intake 25-50%, no ascites MSK: severe sarcopenia, decreased ROM in all extremities,  Ambulatory (I) with rollator Skin:  warm and dry, no rashes or wounds on visible skin Neuro: Generalized weakness, mild cognitive impairment Psych: non-anxious affect, A and O x 3 Hem/lymph/immuno: no widespread bruising   Thank you for the opportunity to participate in the care of Mr. Glantz.  The palliative care team will continue to follow. Please call our office at [phone] if we can be of additional assistance.  Eliezer Lofts, NP , DNP, MPH, AGPCNP-BC, ACHPN  COVID-19 PATIENT SCREENING TOOL  Person answering questions: ____________self______ _____   1.  Is the patient or any family member in the home showing any signs or symptoms regarding respiratory infection?               Person with Symptom- __________NA_________________  a. Fever                                                                          Yes___ No___          ___________________  b. Shortness of breath                                                    Yes___ No___          ___________________ c. Cough/congestion                                       Yes___  No___         ___________________ d. Body aches/pains                                                         Yes___ No___        ____________________ e. Gastrointestinal symptoms (diarrhea, nausea)           Yes___ No___        ____________________  2. Within the past 14 days, has anyone living in the home had any contact with someone with or under investigation for COVID-19?    Yes___ No_X_   Person __________________

## 2020-12-18 ENCOUNTER — Ambulatory Visit: Payer: Medicare Other

## 2020-12-18 ENCOUNTER — Other Ambulatory Visit: Payer: Self-pay | Admitting: Nurse Practitioner

## 2020-12-18 MED ORDER — MEGESTROL ACETATE 400 MG/10ML PO SUSP: 400.0000 mg | Freq: Every day | ORAL | 1 refills | Status: DC

## 2020-12-21 ENCOUNTER — Encounter: Payer: Medicare Other | Admitting: Hospice and Palliative Medicine

## 2020-12-21 ENCOUNTER — Ambulatory Visit: Payer: Medicare Other | Admitting: Oncology

## 2020-12-21 ENCOUNTER — Other Ambulatory Visit: Payer: Medicare Other

## 2020-12-21 ENCOUNTER — Ambulatory Visit: Payer: Medicare Other

## 2021-01-15 DIAGNOSIS — I700 Atherosclerosis of aorta: Secondary | ICD-10-CM | POA: Insufficient documentation

## 2021-01-17 ENCOUNTER — Other Ambulatory Visit (HOSPITAL_COMMUNITY): Payer: Self-pay

## 2021-01-18 ENCOUNTER — Emergency Department
Admission: EM | Admit: 2021-01-18 | Discharge: 2021-01-18 | Disposition: A | Discharge status: Home / Self Care | Attending: Emergency Medicine | Admitting: Emergency Medicine

## 2021-01-18 ENCOUNTER — Emergency Department

## 2021-01-18 ENCOUNTER — Other Ambulatory Visit: Payer: Self-pay

## 2021-01-18 DIAGNOSIS — F17210 Nicotine dependence, cigarettes, uncomplicated: Secondary | ICD-10-CM | POA: Diagnosis not present

## 2021-01-18 DIAGNOSIS — R55 Syncope and collapse: Secondary | ICD-10-CM | POA: Diagnosis not present

## 2021-01-18 DIAGNOSIS — Z794 Long term (current) use of insulin: Secondary | ICD-10-CM | POA: Insufficient documentation

## 2021-01-18 DIAGNOSIS — Z79899 Other long term (current) drug therapy: Secondary | ICD-10-CM | POA: Insufficient documentation

## 2021-01-18 DIAGNOSIS — E119 Type 2 diabetes mellitus without complications: Secondary | ICD-10-CM | POA: Diagnosis not present

## 2021-01-18 DIAGNOSIS — J449 Chronic obstructive pulmonary disease, unspecified: Secondary | ICD-10-CM | POA: Insufficient documentation

## 2021-01-18 DIAGNOSIS — M542 Cervicalgia: Secondary | ICD-10-CM | POA: Insufficient documentation

## 2021-01-18 DIAGNOSIS — W0110XA Fall on same level from slipping, tripping and stumbling with subsequent striking against unspecified object, initial encounter: Secondary | ICD-10-CM | POA: Diagnosis not present

## 2021-01-18 DIAGNOSIS — Y92000 Kitchen of unspecified non-institutional (private) residence as  the place of occurrence of the external cause: Secondary | ICD-10-CM | POA: Diagnosis not present

## 2021-01-18 DIAGNOSIS — Z7982 Long term (current) use of aspirin: Secondary | ICD-10-CM | POA: Insufficient documentation

## 2021-01-18 DIAGNOSIS — S01112A Laceration without foreign body of left eyelid and periocular area, initial encounter: Secondary | ICD-10-CM | POA: Diagnosis not present

## 2021-01-18 DIAGNOSIS — Z85118 Personal history of other malignant neoplasm of bronchus and lung: Secondary | ICD-10-CM | POA: Insufficient documentation

## 2021-01-18 DIAGNOSIS — S00202A Unspecified superficial injury of left eyelid and periocular area, initial encounter: Secondary | ICD-10-CM | POA: Diagnosis present

## 2021-01-18 LAB — COMPREHENSIVE METABOLIC PANEL
ALT: 25 U/L (ref 0–44)
AST: 36 U/L (ref 15–41)
Albumin: 3.3 g/dL — ABNORMAL LOW (ref 3.5–5.0)
Alkaline Phosphatase: 69 U/L (ref 38–126)
Anion gap: 10 (ref 5–15)
BUN: 20 mg/dL (ref 8–23)
CO2: 27 mmol/L (ref 22–32)
Calcium: 8.7 mg/dL — ABNORMAL LOW (ref 8.9–10.3)
Chloride: 104 mmol/L (ref 98–111)
Creatinine, Ser: 0.4 mg/dL — ABNORMAL LOW (ref 0.61–1.24)
GFR, Estimated: >60 mL/min (ref >60)
Glucose, Bld: 81 mg/dL (ref 70–99)
Potassium: 4 mmol/L (ref 3.5–5.1)
Sodium: 141 mmol/L (ref 135–145)
Total Bilirubin: 0.8 mg/dL (ref 0.3–1.2)
Total Protein: 6.1 g/dL — ABNORMAL LOW (ref 6.5–8.1)

## 2021-01-18 LAB — CBC
HCT: 36.8 % — ABNORMAL LOW (ref 39.0–52.0)
Hemoglobin: 12.3 g/dL — ABNORMAL LOW (ref 13.0–17.0)
MCH: 33.9 pg (ref 26.0–34.0)
MCHC: 33.4 g/dL (ref 30.0–36.0)
MCV: 101.4 fL — ABNORMAL HIGH (ref 80.0–100.0)
Platelets: 132 10*3/uL — ABNORMAL LOW (ref 150–400)
RBC: 3.63 MIL/uL — ABNORMAL LOW (ref 4.22–5.81)
RDW: 14.5 % (ref 11.5–15.5)
WBC: 6.4 10*3/uL (ref 4.0–10.5)
nRBC: 0 % (ref 0.0–0.2)

## 2021-01-18 LAB — TROPONIN I (HIGH SENSITIVITY): Troponin I (High Sensitivity): 9 ng/L (ref <18)

## 2021-01-18 NOTE — ED Notes (Signed)
IV catheter removed intact without complication.  D/C instructions given to pt and daughter.  Advised of follow up.  All questions addressed.  Understanding verbalized.  Pt left ER via w/c

## 2021-01-18 NOTE — Progress Notes (Signed)
[facility] Liaison note:  Mr. Garate is currently followed by [facility] hospice services at home with a hospice diagnosis of lung cancer. He is a DNR code, with out of facility DNR in place in the home. This did accompany patient. Patient was transported to the [facility] ED via EMS for evaluation following a syncopal episode. He did have a head strike in this fall. Patient seen at bedside in the emergency room. He was alert and oriented. Small laceration and bruising noted to outer aspect of his left eyebrow. He denied pain. Significant edema to lower extremities. Patient reports it started last Saturday, he reports some improvement by morning.  Writer spoke with staff RN Tammy and attending ED physician Dr. Corky Downs. Head CT results and blood work are pending. If negative patient will most likely discharge home.  Writer spoke with patient's daughter [name] via telephone.  She did express concern regarding patient's increased edema. Discussed that his hospice RN would be able to address that if he discharges home.  [name] is aware that patient will have a follow up visit at home if he discharges and to call the hospice number if she has any needs or concerns. [name] confirmed that patient would transport home via [facility].  Flo Shanks BSN, RN, [facility] Liaison [employer]

## 2021-01-18 NOTE — ED Provider Notes (Signed)
[facility] Emergency Department Provider Note   ____________________________________________    I have reviewed the triage vital signs and the nursing notes.   HISTORY  Chief Complaint Loss of Consciousness     HPI Colin Novak is a 75 y.o. male who presents after a syncopal episode.  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  Patient reports he was making himself some food, turned quickly felt lightheaded and apparently syncopized.  He did hit his head and suffered a an abrasion to the left eyebrow.  Reports some mild neck discomfort as well.  No other injuries reported.  Feels well now.  Denies palpitations or chest pain.  No new shortness of breath.  Past Medical History:  Diagnosis Date  . Anxiety   . Cancer ([place])   . COPD (chronic obstructive pulmonary disease) ([place])   . Diabetes mellitus without complication ([place])    type 2  . GERD (gastroesophageal reflux disease)   . Hyperlipidemia     Patient Active Problem List   Diagnosis Date Noted  . Aortic atherosclerosis ([place]) 01/15/2021  . Hypoglycemia 09/27/2020  . Encounter for antineoplastic chemotherapy 08/09/2020  . NSCLC with EGFR mutation ([place]) 08/09/2020  . Hypotension due to hypovolemia 07/26/2020  . Hyponatremia 07/26/2020  . Type 2 diabetes mellitus without complication, with long-term current use of insulin ([place]) 02/03/2020  . Malignant neoplasm of overlapping sites of lung ([place]) 01/16/2020  . Goals of care, counseling/discussion 06/20/2019  . Protein-calorie malnutrition ([place]) 06/20/2019  . Malignant neoplasm of lung ([place]) 06/20/2019  . Moderate protein-calorie malnutrition ([place]) 05/24/2018  . Tobacco dependence 05/24/2018  . Underweight 05/24/2018  . Weight loss, unintentional 05/24/2018  . Edema extremities 11/19/2017  . Depression, major, single episode, moderate ([place]) 11/19/2017  . Advanced care  planning/counseling discussion 11/19/2017  . Senile purpura ([place]) 04/01/2017  . COPD (chronic obstructive pulmonary disease) ([place]) 07/24/2015  . Malnutrition ([place]) 07/24/2015  . Skin lesion 07/24/2015  . BPH (benign prostatic hyperplasia) 07/24/2015  . Diabetes mellitus associated with hormonal etiology ([place]) 04/11/2015  . Hyperlipidemia 04/11/2015    Past Surgical History:  Procedure Laterality Date  . APPENDECTOMY    . ENDOBRONCHIAL ULTRASOUND Bilateral 06/13/2019   Procedure: ENDOBRONCHIAL ULTRASOUND, BILATERAL;  Surgeon: [name], MD;  Location: [facility] ORS;  Service: Pulmonary;  Laterality: Bilateral;  . EYE SURGERY Bilateral    cataract    Prior to Admission medications   Medication Sig Start Date End Date Taking? Authorizing Provider  acetaminophen (TYLENOL) 500 MG tablet Take 1,000 mg by mouth every 12 (twelve) hours.    [provider]  albuterol (PROAIR HFA) 108 (90 Base) MCG/ACT inhaler Inhale 1-2 puffs into the lungs every 6 (six) hours as needed for wheezing or shortness of breath (use as needed for chest congestion.). 06/09/19   [name], MD  aspirin EC 81 MG tablet Take 81 mg by mouth daily.    [provider]  citalopram (CELEXA) 20 MG tablet Take 1 tablet (20 mg total) by mouth daily. Patient not taking: Reported on 12/12/2020 12/06/18   Guadalupe Maple, MD  Dextromethorphan-guaiFENesin (ROBITUSSIN DM PO) Take 5 mLs by mouth at bedtime.     [provider]  FAMOTIDINE MAXIMUM STRENGTH PO Take 20 mg by mouth daily.    [provider]  fludrocortisone (FLORINEF) 0.1 MG tablet Take 100 mcg by mouth daily. 08/24/20   [provider]  glucose  blood (ONE TOUCH ULTRA TEST) test strip 1 each by Other route 2 (two) times daily as needed for other. Dx: E11.9 07/19/15   Guadalupe Maple, MD  HUMALOG KWIKPEN 100 UNIT/ML KwikPen 14 Units. Sliding scale up to 12 units 06/14/19   [provider]  LANTUS SOLOSTAR  100 UNIT/ML Solostar Pen INJECT 10-20 UNITS INTO THE SKIN DAILY Patient taking differently: Inject 14 Units into the skin daily. 04/27/17   Guadalupe Maple, MD  loperamide (IMODIUM) 2 MG capsule Take 1 capsule (2 mg total) by mouth See admin instructions. Initial: 4 mg, followed by 2 mg after each loose stool; maximum: 16 mg/day Patient not taking: Reported on 12/12/2020 08/27/20   Earlie Server, MD  megestrol (MEGACE) 400 MG/10ML suspension Take 10 mLs (400 mg total) by mouth daily. 12/18/20   Verlon Au, NP  metFORMIN (GLUCOPHAGE) 500 MG tablet TAKE ONE TABLET BY MOUTH TWICE DAILY Patient taking differently: Take 1,000 mg by mouth daily with supper. 12/22/16   Guadalupe Maple, MD  Morphine Sulfate (MORPHINE CONCENTRATE) 10 mg / 0.5 ml concentrated solution Take 0.5 mLs (10 mg total) by mouth every 6 (six) hours as needed for moderate pain or severe pain. Patient not taking: No sig reported 09/27/20   Earlie Server, MD  Multiple Vitamin (MULTIVITAMIN) tablet Take 1 tablet by mouth daily.     [provider]  NOVOFINE 32G X 6 MM MISC USE AS DIRECTED. 5 INJECTIONS A DAY 05/05/16   Johnson, Megan P, DO  prochlorperazine (COMPAZINE) 10 MG tablet Take 1 tablet (10 mg total) by mouth every 6 (six) hours as needed (Nausea or vomiting). Patient not taking: Reported on 01/13/2020 06/20/19 01/16/20  Earlie Server, MD     Allergies Penicillins  Family History  Problem Relation Age of Onset  . Cancer Mother        lung (non smoker)  . Diabetes Father   . Hypertension Father   . Lymphoma Brother   . Prostate cancer Brother     Social History Social History   Tobacco Use  . Smoking status: Current Every Day Smoker    Packs/day: 1.00    Types: Cigarettes  . Smokeless tobacco: Former Systems developer    Types: Chew    Quit date: 06/10/1959  . Tobacco comment: 1 pack daily- 06/09/2019  Vaping Use  . Vaping Use: Never used  Substance Use Topics  . Alcohol use: No  . Drug use: No    Review of  Systems  Constitutional: No fever/chills Eyes: No visual changes.  ENT: No nasal injury Cardiovascular: Denies chest pain. Respiratory: As above Gastrointestinal: No abdominal pain.  Genitourinary: Negative for dysuria. Musculoskeletal: Negative for back pain. Skin: Negative for rash. Neurological: No acute weakness   ____________________________________________   PHYSICAL EXAM:  VITAL SIGNS: ED Triage Vitals  Enc Vitals Group     BP 01/18/21 1545 119/64     Pulse Rate 01/18/21 1545 90     Resp 01/18/21 1545 20     Temp 01/18/21 1545 97.7 F (36.5 C)     Temp Source 01/18/21 1545 Oral     SpO2 01/18/21 1546 95 %     Weight 01/18/21 1546 45.4 kg (100 lb)     Height 01/18/21 1546 1.727 m (5' 8" )     Head Circumference --      Peak Flow --      Pain Score 01/18/21 1546 3     Pain Loc --  Pain Edu? --      Excl. in [place]? --     Constitutional: Alert and oriented. No acute distress Eyes: Conjunctivae are normal.  Head: Shallow abrasion to the left eyebrow Nose: No congestion/rhinnorhea. Mouth/Throat: Mucous membranes are moist.   Neck: No pain with axial load, no vertebral has palpation Cardiovascular: Normal rate, regular rhythm.  Good peripheral circulation.  No chest wall tenderness to palpation Respiratory: Normal respiratory effort.  No retractions. Lungs CTAB. Gastrointestinal: Soft and nontender. No distention.   Musculoskeletal: Normal range of motion of all extremities without pain.  Warm and well perfused Neurologic:  Normal speech and language. No gross focal neurologic deficits are appreciated.  Skin:  Skin is warm, dry and intact. No rash noted. Psychiatric: Mood and affect are normal. Speech and behavior are normal.  ____________________________________________   LABS (all labs ordered are listed, but only abnormal results are displayed)  Labs Reviewed  CBC - Abnormal; Notable for the following components:      Result Value   RBC 3.63 (*)     Hemoglobin 12.3 (*)    HCT 36.8 (*)    MCV 101.4 (*)    Platelets 132 (*)    All other components within normal limits  COMPREHENSIVE METABOLIC PANEL - Abnormal; Notable for the following components:   Creatinine, Ser 0.40 (*)    Calcium 8.7 (*)    Total Protein 6.1 (*)    Albumin 3.3 (*)    All other components within normal limits  TROPONIN I (HIGH SENSITIVITY)   ____________________________________________  EKG  ED ECG REPORT I, Lavonia Drafts, the attending physician, personally viewed and interpreted this ECG.  Date: 01/18/2021  Rhythm: normal sinus rhythm QRS Axis: normal Intervals: normal ST/T Wave abnormalities: normal Narrative Interpretation: no evidence of acute ischemia  ____________________________________________  RADIOLOGY  CT head and cervical spine without acute injury ____________________________________________   PROCEDURES  Procedure(s) performed:   .1-3 Lead EKG Interpretation Performed by: Lavonia Drafts, MD Authorized by: Lavonia Drafts, MD     Interpretation: normal     ECG rate assessment: normal     Rhythm: sinus rhythm     Ectopy: none     Conduction: normal       Critical Care performed: No ____________________________________________   INITIAL IMPRESSION / ASSESSMENT AND PLAN / ED COURSE  Pertinent labs & imaging results that were available during my care of the patient were reviewed by me and considered in my medical decision making (see chart for details).  Patient presents after a syncopal episode as detailed above, EKG is reassuring.  Vital signs here are unremarkable.  CT head and cervical spine obtained which were normal.  Dressing applied to patient's left eyebrow.  Lab work is reassuring, no abnormalities on cardiac monitor.  Asymptomatic in the emergency department, appropriate for discharge at this time with outpatient follow-up with PCP.   ____________________________________________   FINAL CLINICAL  IMPRESSION(S) / ED DIAGNOSES  Final diagnoses:  Syncope and collapse  Laceration of left eyebrow, initial encounter        Note:  This document was prepared using Dragon voice recognition software and may include unintentional dictation errors.   Lavonia Drafts, MD 01/18/21 [phone]

## 2021-01-18 NOTE — ED Triage Notes (Signed)
Pt brought in via EMS from home for syncope.  Pt states he went in the kitchen to make a sandwich and passed out on kitchen floor, fall witnessed by home health nurse.   Lac to left eyebrow.  Pt denies any other injuries.  Denies use of blood thinners aside from one baby ASA daily.   Pt has stage 4 lung CA.  CBG 85 per EMS

## 2021-01-22 ENCOUNTER — Inpatient Hospital Stay
Admission: EM | Admit: 2021-01-22 | Discharge: 2021-01-27 | DRG: 291 | Disposition: A | Discharge status: Home / Self Care | Attending: Internal Medicine | Admitting: Internal Medicine

## 2021-01-22 ENCOUNTER — Emergency Department

## 2021-01-22 DIAGNOSIS — F17210 Nicotine dependence, cigarettes, uncomplicated: Secondary | ICD-10-CM | POA: Diagnosis present

## 2021-01-22 DIAGNOSIS — Z7952 Long term (current) use of systemic steroids: Secondary | ICD-10-CM | POA: Diagnosis not present

## 2021-01-22 DIAGNOSIS — R64 Cachexia: Secondary | ICD-10-CM | POA: Diagnosis present

## 2021-01-22 DIAGNOSIS — F32A Depression, unspecified: Secondary | ICD-10-CM | POA: Diagnosis present

## 2021-01-22 DIAGNOSIS — E785 Hyperlipidemia, unspecified: Secondary | ICD-10-CM | POA: Diagnosis present

## 2021-01-22 DIAGNOSIS — Z794 Long term (current) use of insulin: Secondary | ICD-10-CM | POA: Diagnosis not present

## 2021-01-22 DIAGNOSIS — K219 Gastro-esophageal reflux disease without esophagitis: Secondary | ICD-10-CM | POA: Diagnosis present

## 2021-01-22 DIAGNOSIS — E119 Type 2 diabetes mellitus without complications: Secondary | ICD-10-CM | POA: Diagnosis not present

## 2021-01-22 DIAGNOSIS — Z880 Allergy status to penicillin: Secondary | ICD-10-CM | POA: Diagnosis not present

## 2021-01-22 DIAGNOSIS — E43 Unspecified severe protein-calorie malnutrition: Secondary | ICD-10-CM | POA: Diagnosis present

## 2021-01-22 DIAGNOSIS — Z681 Body mass index (BMI) 19 or less, adult: Secondary | ICD-10-CM | POA: Diagnosis not present

## 2021-01-22 DIAGNOSIS — Z20822 Contact with and (suspected) exposure to covid-19: Secondary | ICD-10-CM | POA: Diagnosis present

## 2021-01-22 DIAGNOSIS — Z833 Family history of diabetes mellitus: Secondary | ICD-10-CM | POA: Diagnosis not present

## 2021-01-22 DIAGNOSIS — Z66 Do not resuscitate: Secondary | ICD-10-CM | POA: Diagnosis present

## 2021-01-22 DIAGNOSIS — E876 Hypokalemia: Secondary | ICD-10-CM | POA: Diagnosis present

## 2021-01-22 DIAGNOSIS — Z7984 Long term (current) use of oral hypoglycemic drugs: Secondary | ICD-10-CM | POA: Diagnosis not present

## 2021-01-22 DIAGNOSIS — R778 Other specified abnormalities of plasma proteins: Secondary | ICD-10-CM | POA: Diagnosis not present

## 2021-01-22 DIAGNOSIS — E1165 Type 2 diabetes mellitus with hyperglycemia: Secondary | ICD-10-CM | POA: Diagnosis present

## 2021-01-22 DIAGNOSIS — I5031 Acute diastolic (congestive) heart failure: Secondary | ICD-10-CM | POA: Diagnosis not present

## 2021-01-22 DIAGNOSIS — J439 Emphysema, unspecified: Secondary | ICD-10-CM | POA: Diagnosis present

## 2021-01-22 DIAGNOSIS — D649 Anemia, unspecified: Secondary | ICD-10-CM | POA: Diagnosis present

## 2021-01-22 DIAGNOSIS — I248 Other forms of acute ischemic heart disease: Secondary | ICD-10-CM | POA: Diagnosis present

## 2021-01-22 DIAGNOSIS — F419 Anxiety disorder, unspecified: Secondary | ICD-10-CM | POA: Diagnosis present

## 2021-01-22 DIAGNOSIS — J441 Chronic obstructive pulmonary disease with (acute) exacerbation: Secondary | ICD-10-CM | POA: Diagnosis present

## 2021-01-22 DIAGNOSIS — I5033 Acute on chronic diastolic (congestive) heart failure: Principal | ICD-10-CM

## 2021-01-22 DIAGNOSIS — I471 Supraventricular tachycardia: Secondary | ICD-10-CM | POA: Diagnosis present

## 2021-01-22 DIAGNOSIS — C3490 Malignant neoplasm of unspecified part of unspecified bronchus or lung: Secondary | ICD-10-CM | POA: Diagnosis not present

## 2021-01-22 DIAGNOSIS — Z7982 Long term (current) use of aspirin: Secondary | ICD-10-CM

## 2021-01-22 DIAGNOSIS — C3412 Malignant neoplasm of upper lobe, left bronchus or lung: Secondary | ICD-10-CM | POA: Diagnosis present

## 2021-01-22 DIAGNOSIS — Z79899 Other long term (current) drug therapy: Secondary | ICD-10-CM

## 2021-01-22 DIAGNOSIS — R0602 Shortness of breath: Secondary | ICD-10-CM | POA: Diagnosis present

## 2021-01-22 DIAGNOSIS — I509 Heart failure, unspecified: Secondary | ICD-10-CM | POA: Diagnosis not present

## 2021-01-22 LAB — CBC WITH DIFFERENTIAL/PLATELET
Abs Immature Granulocytes: 0.02 10*3/uL (ref 0.00–0.07)
Basophils Absolute: 0 10*3/uL (ref 0.0–0.1)
Basophils Relative: 1 %
Eosinophils Absolute: 0 10*3/uL (ref 0.0–0.5)
Eosinophils Relative: 1 %
HCT: 34.7 % — ABNORMAL LOW (ref 39.0–52.0)
Hemoglobin: 11.5 g/dL — ABNORMAL LOW (ref 13.0–17.0)
Immature Granulocytes: 0 %
Lymphocytes Relative: 22 %
Lymphs Abs: 1.1 10*3/uL (ref 0.7–4.0)
MCH: 33.8 pg (ref 26.0–34.0)
MCHC: 33.1 g/dL (ref 30.0–36.0)
MCV: 102.1 fL — ABNORMAL HIGH (ref 80.0–100.0)
Monocytes Absolute: 0.6 10*3/uL (ref 0.1–1.0)
Monocytes Relative: 11 %
Neutro Abs: 3.4 10*3/uL (ref 1.7–7.7)
Neutrophils Relative %: 65 %
Platelets: 236 10*3/uL (ref 150–400)
RBC: 3.4 MIL/uL — ABNORMAL LOW (ref 4.22–5.81)
RDW: 14.4 % (ref 11.5–15.5)
WBC: 5.2 10*3/uL (ref 4.0–10.5)
nRBC: 0 % (ref 0.0–0.2)

## 2021-01-22 LAB — BASIC METABOLIC PANEL
Anion gap: 9 (ref 5–15)
Anion gap: 9 (ref 5–15)
BUN: 12 mg/dL (ref 8–23)
BUN: 13 mg/dL (ref 8–23)
CO2: 29 mmol/L (ref 22–32)
CO2: 28 mmol/L (ref 22–32)
Calcium: 8.8 mg/dL — ABNORMAL LOW (ref 8.9–10.3)
Calcium: 8.7 mg/dL — ABNORMAL LOW (ref 8.9–10.3)
Chloride: 103 mmol/L (ref 98–111)
Chloride: 103 mmol/L (ref 98–111)
Creatinine, Ser: 0.46 mg/dL — ABNORMAL LOW (ref 0.61–1.24)
Creatinine, Ser: 0.46 mg/dL — ABNORMAL LOW (ref 0.61–1.24)
GFR, Estimated: >60 mL/min (ref >60)
GFR, Estimated: >60 mL/min (ref >60)
Glucose, Bld: 183 mg/dL — ABNORMAL HIGH (ref 70–99)
Glucose, Bld: 285 mg/dL — ABNORMAL HIGH (ref 70–99)
Potassium: 3.4 mmol/L — ABNORMAL LOW (ref 3.5–5.1)
Potassium: 3.5 mmol/L (ref 3.5–5.1)
Sodium: 141 mmol/L (ref 135–145)
Sodium: 140 mmol/L (ref 135–145)

## 2021-01-22 LAB — CBC
HCT: 34.6 % — ABNORMAL LOW (ref 39.0–52.0)
Hemoglobin: 11.9 g/dL — ABNORMAL LOW (ref 13.0–17.0)
MCH: 34.4 pg — ABNORMAL HIGH (ref 26.0–34.0)
MCHC: 34.4 g/dL (ref 30.0–36.0)
MCV: 100 fL (ref 80.0–100.0)
Platelets: 225 10*3/uL (ref 150–400)
RBC: 3.46 MIL/uL — ABNORMAL LOW (ref 4.22–5.81)
RDW: 14.7 % (ref 11.5–15.5)
WBC: 6.5 10*3/uL (ref 4.0–10.5)
nRBC: 0 % (ref 0.0–0.2)

## 2021-01-22 LAB — GLUCOSE, CAPILLARY: Glucose-Capillary: 136 mg/dL — ABNORMAL HIGH (ref 70–99)

## 2021-01-22 LAB — CBG MONITORING, ED
Glucose-Capillary: 244 mg/dL — ABNORMAL HIGH (ref 70–99)
Glucose-Capillary: 286 mg/dL — ABNORMAL HIGH (ref 70–99)
Glucose-Capillary: 224 mg/dL — ABNORMAL HIGH (ref 70–99)

## 2021-01-22 LAB — BRAIN NATRIURETIC PEPTIDE
B Natriuretic Peptide: 293.8 pg/mL — ABNORMAL HIGH (ref 0.0–100.0)
B Natriuretic Peptide: 463.7 pg/mL — ABNORMAL HIGH (ref 0.0–100.0)

## 2021-01-22 LAB — RESP PANEL BY RT-PCR (FLU A&B, COVID) ARPGX2
Influenza A by PCR: NEGATIVE
Influenza B by PCR: NEGATIVE
SARS Coronavirus 2 by RT PCR: NEGATIVE

## 2021-01-22 LAB — APTT: aPTT: 28 seconds (ref 24–36)

## 2021-01-22 LAB — PROTIME-INR
INR: 1 (ref 0.8–1.2)
Prothrombin Time: 12.5 seconds (ref 11.4–15.2)

## 2021-01-22 LAB — TROPONIN I (HIGH SENSITIVITY)
Troponin I (High Sensitivity): 37 ng/L — ABNORMAL HIGH (ref <18)
Troponin I (High Sensitivity): 95 ng/L — ABNORMAL HIGH (ref <18)
Troponin I (High Sensitivity): 141 ng/L (ref <18)

## 2021-01-22 LAB — HEMOGLOBIN A1C
Hgb A1c MFr Bld: 12.6 % — ABNORMAL HIGH (ref 4.8–5.6)
Mean Plasma Glucose: 314.92 mg/dL

## 2021-01-22 MED ORDER — ATORVASTATIN CALCIUM 80 MG PO TABS
80.0000 mg | ORAL_TABLET | Freq: Every day | ORAL | Status: DC
  Administered 2021-01-23 – 2021-01-27 (×5): 80 mg via ORAL
  Filled 2021-01-22 (×5): qty 1

## 2021-01-22 MED ORDER — ALBUTEROL SULFATE (2.5 MG/3ML) 0.083% IN NEBU
5.0000 mg | INHALATION_SOLUTION | Freq: Once | RESPIRATORY_TRACT | Status: AC
  Administered 2021-01-22: 5 mg via RESPIRATORY_TRACT

## 2021-01-22 MED ORDER — ASPIRIN EC 81 MG PO TBEC: 81.0000 mg | DELAYED_RELEASE_TABLET | Freq: Every day | ORAL | Status: DC

## 2021-01-22 MED ORDER — NITROGLYCERIN 0.4 MG SL SUBL: 0.4000 mg | SUBLINGUAL_TABLET | SUBLINGUAL | Status: DC | PRN

## 2021-01-22 MED ORDER — INSULIN ASPART 100 UNIT/ML ~~LOC~~ SOLN
0.0000 [IU] | Freq: Three times a day (TID) | SUBCUTANEOUS | Status: DC
  Administered 2021-01-22: 3 [IU] via SUBCUTANEOUS
  Administered 2021-01-22: 1 [IU] via SUBCUTANEOUS
  Administered 2021-01-22: 3 [IU] via SUBCUTANEOUS
  Administered 2021-01-22: 5 [IU] via SUBCUTANEOUS
  Administered 2021-01-23: 2 [IU] via SUBCUTANEOUS
  Administered 2021-01-23 (×2): 9 [IU] via SUBCUTANEOUS
  Administered 2021-01-23: 2 [IU] via SUBCUTANEOUS
  Administered 2021-01-24: 9 [IU] via SUBCUTANEOUS
  Filled 2021-01-22 (×9): qty 1

## 2021-01-22 MED ORDER — HEPARIN (PORCINE) 25000 UT/250ML-% IV SOLN
900.0000 [IU]/h | INTRAVENOUS | Status: DC
  Administered 2021-01-22: 900 [IU]/h via INTRAVENOUS
  Filled 2021-01-22: qty 250

## 2021-01-22 MED ORDER — MEGESTROL ACETATE 400 MG/10ML PO SUSP
400.0000 mg | Freq: Every day | ORAL | Status: DC
  Administered 2021-01-22 – 2021-01-27 (×6): 400 mg via ORAL
  Filled 2021-01-22 (×6): qty 10

## 2021-01-22 MED ORDER — IPRATROPIUM BROMIDE 0.02 % IN SOLN
0.5000 mg | Freq: Once | RESPIRATORY_TRACT | Status: AC
  Administered 2021-01-22: 0.5 mg via RESPIRATORY_TRACT

## 2021-01-22 MED ORDER — ONDANSETRON HCL 4 MG PO TABS: 4.0000 mg | ORAL_TABLET | Freq: Four times a day (QID) | ORAL | Status: DC | PRN

## 2021-01-22 MED ORDER — FLUDROCORTISONE ACETATE 0.1 MG PO TABS
100.0000 ug | ORAL_TABLET | Freq: Every day | ORAL | Status: DC
  Administered 2021-01-22 – 2021-01-27 (×6): 100 ug via ORAL
  Filled 2021-01-22 (×6): qty 1

## 2021-01-22 MED ORDER — FUROSEMIDE 10 MG/ML IJ SOLN
40.0000 mg | Freq: Two times a day (BID) | INTRAMUSCULAR | Status: DC
  Administered 2021-01-23 – 2021-01-25 (×5): 40 mg via INTRAVENOUS
  Filled 2021-01-22 (×6): qty 4

## 2021-01-22 MED ORDER — SODIUM CHLORIDE 0.9 % IV SOLN
500.0000 mg | INTRAVENOUS | Status: DC
  Administered 2021-01-22 – 2021-01-23 (×2): 500 mg via INTRAVENOUS
  Filled 2021-01-22 (×2): qty 500

## 2021-01-22 MED ORDER — HEPARIN BOLUS VIA INFUSION
4000.0000 [IU] | Freq: Once | INTRAVENOUS | Status: AC
  Administered 2021-01-22: 4000 [IU] via INTRAVENOUS
  Filled 2021-01-22: qty 4000

## 2021-01-22 MED ORDER — SODIUM CHLORIDE 0.9 % IV SOLN: INTRAVENOUS | Status: DC

## 2021-01-22 MED ORDER — IPRATROPIUM-ALBUTEROL 0.5-2.5 (3) MG/3ML IN SOLN
3.0000 mL | Freq: Four times a day (QID) | RESPIRATORY_TRACT | Status: DC
  Administered 2021-01-22 – 2021-01-23 (×4): 3 mL via RESPIRATORY_TRACT
  Filled 2021-01-22 (×4): qty 3

## 2021-01-22 MED ORDER — ENOXAPARIN SODIUM 40 MG/0.4ML ~~LOC~~ SOLN: 40.0000 mg | SUBCUTANEOUS | Status: DC

## 2021-01-22 MED ORDER — FAMOTIDINE 20 MG PO TABS
20.0000 mg | ORAL_TABLET | Freq: Every day | ORAL | Status: DC
  Administered 2021-01-22 – 2021-01-27 (×6): 20 mg via ORAL
  Filled 2021-01-22 (×6): qty 1

## 2021-01-22 MED ORDER — ONDANSETRON HCL 4 MG/2ML IJ SOLN: 4.0000 mg | Freq: Four times a day (QID) | INTRAMUSCULAR | Status: DC | PRN

## 2021-01-22 MED ORDER — MORPHINE SULFATE (PF) 2 MG/ML IV SOLN
2.0000 mg | INTRAVENOUS | Status: DC | PRN
Start: 2021-01-22 — End: 2021-01-27

## 2021-01-22 MED ORDER — BUDESONIDE 0.25 MG/2ML IN SUSP
0.2500 mg | Freq: Two times a day (BID) | RESPIRATORY_TRACT | Status: DC
  Administered 2021-01-22 – 2021-01-27 (×11): 0.25 mg via RESPIRATORY_TRACT
  Filled 2021-01-22 (×11): qty 2

## 2021-01-22 MED ORDER — INSULIN GLARGINE 100 UNIT/ML ~~LOC~~ SOLN
14.0000 [IU] | Freq: Every day | SUBCUTANEOUS | Status: DC
  Administered 2021-01-22 – 2021-01-23 (×2): 14 [IU] via SUBCUTANEOUS
  Filled 2021-01-22 (×3): qty 0.14

## 2021-01-22 MED ORDER — SODIUM CHLORIDE 0.9 % IV SOLN
1.0000 g | INTRAVENOUS | Status: DC
  Administered 2021-01-22: 1 g via INTRAVENOUS
  Filled 2021-01-22: qty 10

## 2021-01-22 MED ORDER — DM-GUAIFENESIN ER 30-600 MG PO TB12
1.0000 | ORAL_TABLET | Freq: Two times a day (BID) | ORAL | Status: DC | PRN
  Filled 2021-01-22: qty 1

## 2021-01-22 MED ORDER — MAGNESIUM HYDROXIDE 400 MG/5ML PO SUSP
30.0000 mL | Freq: Every day | ORAL | Status: DC | PRN
Start: 2021-01-22 — End: 2021-01-27

## 2021-01-22 MED ORDER — ASPIRIN EC 81 MG PO TBEC
81.0000 mg | DELAYED_RELEASE_TABLET | Freq: Every day | ORAL | Status: DC
  Administered 2021-01-23 – 2021-01-27 (×5): 81 mg via ORAL
  Filled 2021-01-22 (×5): qty 1

## 2021-01-22 MED ORDER — TRAZODONE HCL 50 MG PO TABS: 25.0000 mg | ORAL_TABLET | Freq: Every evening | ORAL | Status: DC | PRN

## 2021-01-22 MED ORDER — PREDNISONE 20 MG PO TABS: 40.0000 mg | ORAL_TABLET | Freq: Every day | ORAL | Status: DC

## 2021-01-22 MED ORDER — METHYLPREDNISOLONE SODIUM SUCC 40 MG IJ SOLR
40.0000 mg | Freq: Four times a day (QID) | INTRAMUSCULAR | Status: DC
  Administered 2021-01-22: 40 mg via INTRAVENOUS
  Filled 2021-01-22: qty 1

## 2021-01-22 MED ORDER — ALBUTEROL SULFATE (2.5 MG/3ML) 0.083% IN NEBU
5.0000 mg | INHALATION_SOLUTION | Freq: Once | RESPIRATORY_TRACT | Status: AC
  Administered 2021-01-22: 5 mg via RESPIRATORY_TRACT
  Filled 2021-01-22 (×2): qty 6

## 2021-01-22 MED ORDER — GUAIFENESIN ER 600 MG PO TB12
600.0000 mg | ORAL_TABLET | Freq: Two times a day (BID) | ORAL | Status: DC
  Administered 2021-01-22 – 2021-01-27 (×11): 600 mg via ORAL
  Filled 2021-01-22 (×11): qty 1

## 2021-01-22 MED ORDER — ASPIRIN EC 325 MG PO TBEC
325.0000 mg | DELAYED_RELEASE_TABLET | Freq: Every day | ORAL | Status: DC
  Administered 2021-01-22: 325 mg via ORAL
  Filled 2021-01-22: qty 1

## 2021-01-22 MED ORDER — METHYLPREDNISOLONE SODIUM SUCC 40 MG IJ SOLR
40.0000 mg | Freq: Three times a day (TID) | INTRAMUSCULAR | Status: DC
  Administered 2021-01-22 – 2021-01-23 (×4): 40 mg via INTRAVENOUS
  Filled 2021-01-22 (×5): qty 1

## 2021-01-22 MED ORDER — ACETAMINOPHEN 325 MG PO TABS
650.0000 mg | ORAL_TABLET | Freq: Four times a day (QID) | ORAL | Status: DC | PRN
  Filled 2021-01-22: qty 2

## 2021-01-22 MED ORDER — ARFORMOTEROL TARTRATE 15 MCG/2ML IN NEBU
15.0000 ug | INHALATION_SOLUTION | Freq: Two times a day (BID) | RESPIRATORY_TRACT | Status: DC
  Administered 2021-01-22 – 2021-01-25 (×5): 15 ug via RESPIRATORY_TRACT
  Filled 2021-01-22 (×9): qty 2

## 2021-01-22 MED ORDER — ACETAMINOPHEN 650 MG RE SUPP: 650.0000 mg | Freq: Four times a day (QID) | RECTAL | Status: DC | PRN

## 2021-01-22 MED ORDER — ADULT MULTIVITAMIN W/MINERALS CH
1.0000 | ORAL_TABLET | Freq: Every day | ORAL | Status: DC
  Administered 2021-01-22 – 2021-01-27 (×6): 1 via ORAL
  Filled 2021-01-22 (×6): qty 1

## 2021-01-22 MED ORDER — CITALOPRAM HYDROBROMIDE 20 MG PO TABS
20.0000 mg | ORAL_TABLET | Freq: Every day | ORAL | Status: DC
  Administered 2021-01-22 – 2021-01-27 (×6): 20 mg via ORAL
  Filled 2021-01-22 (×6): qty 1

## 2021-01-22 NOTE — ED Provider Notes (Signed)
[facility] Emergency Department Provider Note  ____________________________________________   Event Date/Time   First MD Initiated Contact with Patient 01/22/21 [phone]     (approximate)  I have reviewed the triage vital signs and the nursing notes.   HISTORY  Chief Complaint Shortness of Breath    HPI Colin Novak is a 75 y.o. male with history of diabetes, hyperlipidemia, COPD, lung cancer in remission who presents to the emergency department with EMS with shortness of breath for the past several days.  Was seen in the emergency department for the same on Friday, March 25.  EMS reports patient was wheezing.  Sats 90% on room air at home.  Does not wear oxygen chronically.  EMS gave 2 DuoNeb's and 125 mg of IV Solu-Medrol.  Patient denies fevers, cough, chest pain.  Reports bilateral lower extremity swelling for the past 8 to 10 days.  No known history of CHF.  No calf tenderness.  Denies history of PE or DVT.        Past Medical History:  Diagnosis Date  . Anxiety   . Cancer ([place])   . COPD (chronic obstructive pulmonary disease) ([place])   . Diabetes mellitus without complication ([place])    type 2  . GERD (gastroesophageal reflux disease)   . Hyperlipidemia     Patient Active Problem List   Diagnosis Date Noted  . COPD exacerbation ([place]) 01/22/2021  . Aortic atherosclerosis ([place]) 01/15/2021  . Hypoglycemia 09/27/2020  . Encounter for antineoplastic chemotherapy 08/09/2020  . NSCLC with EGFR mutation ([place]) 08/09/2020  . Hypotension due to hypovolemia 07/26/2020  . Hyponatremia 07/26/2020  . Type 2 diabetes mellitus without complication, with long-term current use of insulin ([place]) 02/03/2020  . Malignant neoplasm of overlapping sites of lung ([place]) 01/16/2020  . Goals of care, counseling/discussion 06/20/2019  . Protein-calorie malnutrition ([place]) 06/20/2019  . Malignant neoplasm of lung ([place]) 06/20/2019  . Moderate protein-calorie malnutrition  ([place]) 05/24/2018  . Tobacco dependence 05/24/2018  . Underweight 05/24/2018  . Weight loss, unintentional 05/24/2018  . Edema extremities 11/19/2017  . Depression, major, single episode, moderate ([place]) 11/19/2017  . Advanced care planning/counseling discussion 11/19/2017  . Senile purpura ([place]) 04/01/2017  . COPD (chronic obstructive pulmonary disease) ([place]) 07/24/2015  . Malnutrition ([place]) 07/24/2015  . Skin lesion 07/24/2015  . BPH (benign prostatic hyperplasia) 07/24/2015  . Diabetes mellitus associated with hormonal etiology ([place]) 04/11/2015  . Hyperlipidemia 04/11/2015    Past Surgical History:  Procedure Laterality Date  . APPENDECTOMY    . ENDOBRONCHIAL ULTRASOUND Bilateral 06/13/2019   Procedure: ENDOBRONCHIAL ULTRASOUND, BILATERAL;  Surgeon: [name], MD;  Location: [facility] ORS;  Service: Pulmonary;  Laterality: Bilateral;  . EYE SURGERY Bilateral    cataract    Prior to Admission medications   Medication Sig Start Date End Date Taking? Authorizing Provider  acetaminophen (TYLENOL) 500 MG tablet Take 1,000 mg by mouth every 12 (twelve) hours.    [provider]  albuterol (PROAIR HFA) 108 (90 Base) MCG/ACT inhaler Inhale 1-2 puffs into the lungs every 6 (six) hours as needed for wheezing or shortness of breath (use as needed for chest congestion.). 06/09/19   [name], MD  aspirin EC 81 MG tablet Take 81 mg by mouth daily.    [provider]  citalopram (CELEXA) 20 MG tablet Take 1 tablet (20 mg total) by mouth daily. Patient not taking: Reported on 12/12/2020 12/06/18   Guadalupe Maple, MD  Dextromethorphan-guaiFENesin (ROBITUSSIN DM PO) Take 5  mLs by mouth at bedtime.     [provider]  FAMOTIDINE MAXIMUM STRENGTH PO Take 20 mg by mouth daily.    [provider]  fludrocortisone (FLORINEF) 0.1 MG tablet Take 100 mcg by mouth daily. 08/24/20   [provider]  glucose blood (ONE TOUCH ULTRA TEST) test  strip 1 each by Other route 2 (two) times daily as needed for other. Dx: E11.9 07/19/15   Guadalupe Maple, MD  HUMALOG KWIKPEN 100 UNIT/ML KwikPen 14 Units. Sliding scale up to 12 units 06/14/19   [provider]  LANTUS SOLOSTAR 100 UNIT/ML Solostar Pen INJECT 10-20 UNITS INTO THE SKIN DAILY Patient taking differently: Inject 14 Units into the skin daily. 04/27/17   Guadalupe Maple, MD  loperamide (IMODIUM) 2 MG capsule Take 1 capsule (2 mg total) by mouth See admin instructions. Initial: 4 mg, followed by 2 mg after each loose stool; maximum: 16 mg/day Patient not taking: Reported on 12/12/2020 08/27/20   Earlie Server, MD  megestrol (MEGACE) 400 MG/10ML suspension Take 10 mLs (400 mg total) by mouth daily. 12/18/20   Verlon Au, NP  metFORMIN (GLUCOPHAGE) 500 MG tablet TAKE ONE TABLET BY MOUTH TWICE DAILY Patient taking differently: Take 1,000 mg by mouth daily with supper. 12/22/16   Guadalupe Maple, MD  Morphine Sulfate (MORPHINE CONCENTRATE) 10 mg / 0.5 ml concentrated solution Take 0.5 mLs (10 mg total) by mouth every 6 (six) hours as needed for moderate pain or severe pain. Patient not taking: No sig reported 09/27/20   Earlie Server, MD  Multiple Vitamin (MULTIVITAMIN) tablet Take 1 tablet by mouth daily.     [provider]  NOVOFINE 32G X 6 MM MISC USE AS DIRECTED. 5 INJECTIONS A DAY 05/05/16   Johnson, Megan P, DO  prochlorperazine (COMPAZINE) 10 MG tablet Take 1 tablet (10 mg total) by mouth every 6 (six) hours as needed (Nausea or vomiting). Patient not taking: Reported on 01/13/2020 06/20/19 01/16/20  Earlie Server, MD    Allergies Penicillins  Family History  Problem Relation Age of Onset  . Cancer Mother        lung (non smoker)  . Diabetes Father   . Hypertension Father   . Lymphoma Brother   . Prostate cancer Brother     Social History Social History   Tobacco Use  . Smoking status: Current Every Day Smoker    Packs/day: 1.00    Types: Cigarettes  . Smokeless  tobacco: Former Systems developer    Types: Chew    Quit date: 06/10/1959  . Tobacco comment: 1 pack daily- 06/09/2019  Vaping Use  . Vaping Use: Never used  Substance Use Topics  . Alcohol use: No  . Drug use: No    Review of Systems Constitutional: No fever. Eyes: No visual changes. ENT: No sore throat. Cardiovascular: No chest pain. Respiratory: + shortness of breath. Gastrointestinal: No nausea, vomiting, diarrhea. Genitourinary: Negative for dysuria. Musculoskeletal: Negative for back pain. Skin: Negative for rash. Neurological: Negative for focal weakness or numbness.  ____________________________________________   PHYSICAL EXAM:  VITAL SIGNS: ED Triage Vitals  Enc Vitals Group     BP 01/22/21 0053 105/72     Pulse Rate 01/22/21 0053 (!) 104     Resp 01/22/21 0053 (!) 24     Temp 01/22/21 0053 98 F (36.7 C)     Temp Source 01/22/21 0053 Oral     SpO2 01/22/21 0052 96 %     Weight 01/22/21  0055 122 lb 8 oz (55.6 kg)     Height --      Head Circumference --      Peak Flow --      Pain Score 01/22/21 0055 0     Pain Loc --      Pain Edu? --      Excl. in [place]? --    CONSTITUTIONAL: Alert and oriented and responds appropriately to questions.  Elderly, chronically ill-appearing, thin HEAD: Normocephalic EYES: Conjunctivae clear, pupils appear equal, EOM appear intact ENT: normal nose; moist mucous membranes NECK: Supple, normal ROM CARD: Regular and tachycardic; S1 and S2 appreciated; no murmurs, no clicks, no rubs, no gallops RESP: Has coarse breath sounds diffusely with scattered expiratory wheezes and diminished at bases bilaterally.  He is tachypneic and speaking in truncated sentences.  Sats 98% on room air. ABD/GI: Normal bowel sounds; non-distended; soft, non-tender, no rebound, no guarding, no peritoneal signs, no hepatosplenomegaly BACK: The back appears normal EXT: Normal ROM in all joints; no deformity noted, 1+ pitting edema and bilateral feet, no calf tenderness  or calf swelling SKIN: Normal color for age and race; warm; no rash on exposed skin NEURO: Moves all extremities equally PSYCH: The patient's mood and manner are appropriate.  ____________________________________________   LABS (all labs ordered are listed, but only abnormal results are displayed)  Labs Reviewed  CBC WITH DIFFERENTIAL/PLATELET - Abnormal; Notable for the following components:      Result Value   RBC 3.40 (*)    Hemoglobin 11.5 (*)    HCT 34.7 (*)    MCV 102.1 (*)    All other components within normal limits  BASIC METABOLIC PANEL - Abnormal; Notable for the following components:   Potassium 3.4 (*)    Glucose, Bld 183 (*)    Creatinine, Ser 0.46 (*)    Calcium 8.8 (*)    All other components within normal limits  BRAIN NATRIURETIC PEPTIDE - Abnormal; Notable for the following components:   B Natriuretic Peptide 293.8 (*)    All other components within normal limits  TROPONIN I (HIGH SENSITIVITY) - Abnormal; Notable for the following components:   Troponin I (High Sensitivity) 37 (*)    All other components within normal limits  RESP PANEL BY RT-PCR (FLU A&B, COVID) [id number]  TROPONIN I (HIGH SENSITIVITY)   ____________________________________________  EKG   EKG Interpretation  Date/Time:  Tuesday January 22 2021 00:58:46 EDT Ventricular Rate:  94 PR Interval:    QRS Duration: 76 QT Interval:  385 QTC Calculation: 482 R Axis:   86 Text Interpretation: Sinus rhythm Anteroseptal infarct, age indeterminate Minimal ST elevation, inferior leads No significant change since last tracing Confirmed by Pryor Curia [id number]) on 01/22/2021 1:01:36 AM       ____________________________________________  RADIOLOGY Jessie Foot Anaika Santillano, personally viewed and evaluated these images (plain radiographs) as part of my medical decision making, as well as reviewing the written report by the radiologist.  ED MD interpretation: No infiltrate or edema on chest x-ray.  Official  radiology report(s): DG Chest Portable 1 View  Result Date: 01/22/2021 CLINICAL DATA:  Shortness of breath EXAM: PORTABLE CHEST 1 VIEW COMPARISON:  09/27/2020 FINDINGS: Cardiac shadow is within normal limits. Aortic calcifications are again seen. Patient rotation to the right accentuates the mediastinal markings but stable. This is similar to fibrotic changes seen on prior CT. Persistent density is noted in the medial aspect of the left apex similar to that seen on prior CT exam. Lungs  are hyperinflated bilaterally. No bony abnormality noted. IMPRESSION: Stable changes in the medial paramediastinal region and left apex medially consistent with the given clinical history lung carcinoma. Stable changes of COPD. Electronically Signed   By: Inez Catalina M.D.   On: 01/22/2021 01:28    ____________________________________________   PROCEDURES  Procedure(s) performed (including Critical Care):  Procedures  CRITICAL CARE Performed by: Cyril Mourning Alexy Bringle   Total critical care time: 65 minutes  Critical care time was exclusive of separately billable procedures and treating other patients.  Critical care was necessary to treat or prevent imminent or life-threatening deterioration.  Critical care was time spent personally by me on the following activities: development of treatment plan with patient and/or surrogate as well as nursing, discussions with consultants, evaluation of patient's response to treatment, examination of patient, obtaining history from patient or surrogate, ordering and performing treatments and interventions, ordering and review of laboratory studies, ordering and review of radiographic studies, pulse oximetry and re-evaluation of patient's condition.  ____________________________________________   INITIAL IMPRESSION / ASSESSMENT AND PLAN / ED COURSE  As part of my medical decision making, I reviewed the following data within the [place] notes reviewed  and incorporated, Labs reviewed , EKG interpreted , Old EKG reviewed, Radiograph reviewed , Discussed with admitting physician  and Notes from prior ED visits         Patient here with shortness of breath, wheezing.  Differential includes COPD exacerbation, CHF, ACS, PE, Covid, influenza.  Will obtain labs, chest x-ray.  We will continue breathing treatments.  Anticipate admission.  EKG nonischemic.  ED PROGRESS  Patient's labs show minimally elevated troponin which is likely from demand ischemia.  EKG is nonischemic.  Chest x-ray shows findings consistent with COPD but no infiltrate or edema.  Patient continues to wheeze despite breathing treatments.  Will admit.   3:03 AM Discussed patient's case with hospitalist, Dr. Sidney Ace.  I have recommended admission and patient (and family if present) agree with this plan. Admitting physician will place admission orders.   I reviewed all nursing notes, vitals, pertinent previous records and reviewed/interpreted all EKGs, lab and urine results, imaging (as available).   ____________________________________________   FINAL CLINICAL IMPRESSION(S) / ED DIAGNOSES  Final diagnoses:  COPD exacerbation [facility])     ED Discharge Orders    None      *Please note:  Colin Novak was evaluated in Emergency Department on 01/22/2021 for the symptoms described in the history of present illness. He was evaluated in the context of the global COVID-19 pandemic, which necessitated consideration that the patient might be at risk for infection with the SARS-CoV-2 virus that causes COVID-19. Institutional protocols and algorithms that pertain to the evaluation of patients at risk for COVID-19 are in a state of rapid change based on information released by regulatory bodies including the CDC and federal and state organizations. These policies and algorithms were followed during the patient's care in the ED.  Some ED evaluations and interventions may be delayed as a  result of limited staffing during and the pandemic.*   Note:  This document was prepared using Dragon voice recognition software and may include unintentional dictation errors.   Reginae Wolfrey, Delice Bison, DO 01/22/21 [phone]

## 2021-01-22 NOTE — ED Notes (Signed)
Pharmacy Tech reviewing with home meds with Hospice Meds

## 2021-01-22 NOTE — Consult Note (Signed)
Colin Novak is a 75 y.o. male  [id number]  Primary Cardiologist: N/A Reason for Consultation: Elevated Troponin  HPI: Patient is a 75 year old male with past medical history of COPD, DMII, hyperlipidemia, and lung cancer who presented to the emergency department with progressively worsening dyspnea over the last few days.  Chest x-ray with stable COPD changes.  Patient has had a mildly increased troponin with current peak at 141 pending next draw and BNP of 294.  We have been consulted due to patient's increased troponin.   Review of Systems: Denies chest pain.  Continues have mild dyspnea but states it is much improved.  States the edema in his legs have mildly improved.   Past Medical History:  Diagnosis Date  . Anxiety   . Cancer ([place])   . COPD (chronic obstructive pulmonary disease) ([place])   . Diabetes mellitus without complication ([place])    type 2  . GERD (gastroesophageal reflux disease)   . Hyperlipidemia     (Not in a hospital admission)    . arformoterol  15 mcg Nebulization BID  . aspirin EC  325 mg Oral Daily  . atorvastatin  80 mg Oral Daily  . budesonide (PULMICORT) nebulizer solution  0.25 mg Nebulization BID  . citalopram  20 mg Oral Daily  . famotidine  20 mg Oral Daily  . fludrocortisone  100 mcg Oral Daily  . furosemide  40 mg Intravenous Q12H  . guaiFENesin  600 mg Oral BID  . insulin aspart  0-9 Units Subcutaneous TID PC & HS  . insulin glargine  14 Units Subcutaneous Daily  . ipratropium-albuterol  3 mL Nebulization QID  . megestrol  400 mg Oral Daily  . methylPREDNISolone (SOLU-MEDROL) injection  40 mg Intravenous Q8H   Followed by  . [START ON 01/24/2021] predniSONE  40 mg Oral Q breakfast  . multivitamin with minerals  1 tablet Oral Daily    Infusions: . azithromycin Stopped (01/22/21 8938)    Allergies  Allergen Reactions  . Penicillins Rash    Social History   Socioeconomic History  . Marital status: Widowed    Spouse name: Not  on file  . Number of children: 1  . Years of education: Not on file  . Highest education level: High school graduate  Occupational History  . Occupation: retired  Tobacco Use  . Smoking status: Current Every Day Smoker    Packs/day: 1.00    Types: Cigarettes  . Smokeless tobacco: Former Systems developer    Types: Chew    Quit date: 06/10/1959  . Tobacco comment: 1 pack daily- 06/09/2019  Vaping Use  . Vaping Use: Never used  Substance and Sexual Activity  . Alcohol use: No  . Drug use: No  . Sexual activity: Not Currently  Other Topics Concern  . Not on file  Social History Narrative  . Not on file   Social Determinants of Health   Financial Resource Strain: Not on file  Food Insecurity: Not on file  Transportation Needs: Not on file  Physical Activity: Not on file  Stress: Not on file  Social Connections: Not on file  Intimate Partner Violence: Not on file    Family History  Problem Relation Age of Onset  . Cancer Mother        lung (non smoker)  . Diabetes Father   . Hypertension Father   . Lymphoma Brother   . Prostate cancer Brother     PHYSICAL EXAM: Vitals:   01/22/21 1017  01/22/21 0900  BP:  102/71  Pulse: (!) 107 100  Resp:  17  Temp:    SpO2: 94% 93%     Intake/Output Summary (Last 24 hours) at 01/22/2021 0954 Last data filed at 01/22/2021 0626 Gross per 24 hour  Intake 250 ml  Output --  Net 250 ml    General:  Well appearing. No respiratory difficulty HEENT: normal Neck: supple. no JVD. Carotids 2+ bilat; no bruits. No lymphadenopathy or thryomegaly appreciated. Cor: PMI nondisplaced. Regular rate & rhythm. No rubs, gallops or murmurs. Lungs: Scattered rhonchi and expiratory wheezes Abdomen: soft, nontender, nondistended. No hepatosplenomegaly. No bruits or masses. Good bowel sounds. Extremities: +3 BLE pitting edema Neuro: alert & oriented x 3, cranial nerves grossly intact. moves all 4 extremities w/o difficulty. Affect pleasant.  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  94/BPM  Results for orders placed or performed during the hospital encounter of 01/22/21 (from the past 24 hour(s))  CBC with Differential/Platelet     Status: Abnormal   Collection Time: 01/22/21  1:04 AM  Result Value Ref Range   WBC 5.2 4.0 - 10.5 K/uL   RBC 3.40 (L) 4.22 - 5.81 MIL/uL   Hemoglobin 11.5 (L) 13.0 - 17.0 g/dL   HCT 34.7 (L) 39.0 - 52.0 %   MCV 102.1 (H) 80.0 - 100.0 fL   MCH 33.8 26.0 - 34.0 pg   MCHC 33.1 30.0 - 36.0 g/dL   RDW 14.4 11.5 - 15.5 %   Platelets 236 150 - 400 K/uL   nRBC 0.0 0.0 - 0.2 %   Neutrophils Relative % 65 %   Neutro Abs 3.4 1.7 - 7.7 K/uL   Lymphocytes Relative 22 %   Lymphs Abs 1.1 0.7 - 4.0 K/uL   Monocytes Relative 11 %   Monocytes Absolute 0.6 0.1 - 1.0 K/uL   Eosinophils Relative 1 %   Eosinophils Absolute 0.0 0.0 - 0.5 K/uL   Basophils Relative 1 %   Basophils Absolute 0.0 0.0 - 0.1 K/uL   Immature Granulocytes 0 %   Abs Immature Granulocytes 0.02 0.00 - 0.07 K/uL  Basic metabolic panel     Status: Abnormal   Collection Time: 01/22/21  1:04 AM  Result Value Ref Range   Sodium 141 135 - 145 mmol/L   Potassium 3.4 (L) 3.5 - 5.1 mmol/L   Chloride 103 98 - 111 mmol/L   CO2 29 22 - 32 mmol/L   Glucose, Bld 183 (H) 70 - 99 mg/dL   BUN 12 8 - 23 mg/dL   Creatinine, Ser 0.46 (L) 0.61 - 1.24 mg/dL   Calcium 8.8 (L) 8.9 - 10.3 mg/dL   GFR, Estimated >60 >60 mL/min   Anion gap 9 5 - 15  Troponin I (High Sensitivity)     Status: Abnormal   Collection Time: 01/22/21  1:04 AM  Result Value Ref Range   Troponin I (High Sensitivity) 37 (H) <18 ng/L  Brain natriuretic peptide     Status: Abnormal   Collection Time: 01/22/21  1:04 AM  Result Value Ref Range   B Natriuretic Peptide 293.8 (H) 0.0 - 100.0 pg/mL  Resp Panel by RT-PCR (Flu A&B, Covid) Nasopharyngeal Swab     Status: None   Collection Time: 01/22/21  1:05 AM   Specimen: Nasopharyngeal Swab; Nasopharyngeal(NP) swabs in vial transport  medium  Result Value Ref Range   SARS Coronavirus 2 by RT PCR NEGATIVE NEGATIVE   Influenza A by PCR NEGATIVE NEGATIVE  Influenza B by PCR NEGATIVE NEGATIVE  Troponin I (High Sensitivity)     Status: Abnormal   Collection Time: 01/22/21  2:50 AM  Result Value Ref Range   Troponin I (High Sensitivity) 95 (H) <18 ng/L  Culture, blood (Routine X 2) w Reflex to ID Panel     Status: None (Preliminary result)   Collection Time: 01/22/21  4:23 AM   Specimen: BLOOD  Result Value Ref Range   Specimen Description BLOOD LEFT AC    Special Requests      BOTTLES DRAWN AEROBIC AND ANAEROBIC Blood Culture adequate volume   Culture      NO GROWTH <12 HOURS Performed at [facility], [address] [place], [place] [address]    Report Status PENDING   Basic metabolic panel     Status: Abnormal   Collection Time: 01/22/21  4:23 AM  Result Value Ref Range   Sodium 140 135 - 145 mmol/L   Potassium 3.5 3.5 - 5.1 mmol/L   Chloride 103 98 - 111 mmol/L   CO2 28 22 - 32 mmol/L   Glucose, Bld 285 (H) 70 - 99 mg/dL   BUN 13 8 - 23 mg/dL   Creatinine, Ser 0.46 (L) 0.61 - 1.24 mg/dL   Calcium 8.7 (L) 8.9 - 10.3 mg/dL   GFR, Estimated >60 >60 mL/min   Anion gap 9 5 - 15  CBC     Status: Abnormal   Collection Time: 01/22/21  4:23 AM  Result Value Ref Range   WBC 6.5 4.0 - 10.5 K/uL   RBC 3.46 (L) 4.22 - 5.81 MIL/uL   Hemoglobin 11.9 (L) 13.0 - 17.0 g/dL   HCT 34.6 (L) 39.0 - 52.0 %   MCV 100.0 80.0 - 100.0 fL   MCH 34.4 (H) 26.0 - 34.0 pg   MCHC 34.4 30.0 - 36.0 g/dL   RDW 14.7 11.5 - 15.5 %   Platelets 225 150 - 400 K/uL   nRBC 0.0 0.0 - 0.2 %  Brain natriuretic peptide     Status: Abnormal   Collection Time: 01/22/21  4:23 AM  Result Value Ref Range   B Natriuretic Peptide 463.7 (H) 0.0 - 100.0 pg/mL  Culture, blood (Routine X 2) w Reflex to ID Panel     Status: None (Preliminary result)   Collection Time: 01/22/21  4:24 AM   Specimen: BLOOD  Result Value Ref Range   Specimen  Description BLOOD RIGHT FA    Special Requests      BOTTLES DRAWN AEROBIC AND ANAEROBIC Blood Culture adequate volume   Culture      NO GROWTH <12 HOURS Performed at [facility], [address] [place], [place] [address]    Report Status PENDING   APTT     Status: None   Collection Time: 01/22/21  5:50 AM  Result Value Ref Range   aPTT 28 24 - 36 seconds  Protime-INR     Status: None   Collection Time: 01/22/21  5:50 AM  Result Value Ref Range   Prothrombin Time 12.5 11.4 - 15.2 seconds   INR 1.0 0.8 - 1.2  Troponin I (High Sensitivity)     Status: Abnormal   Collection Time: 01/22/21  7:46 AM  Result Value Ref Range   Troponin I (High Sensitivity) 141 (HH) <18 ng/L  CBG monitoring, ED     Status: Abnormal   Collection Time: 01/22/21  9:11 AM  Result Value Ref Range   Glucose-Capillary 244 (  H) 70 - 99 mg/dL   Comment 1 Notify RN    Comment 2 Document in Chart    DG Chest Portable 1 View  Result Date: 01/22/2021 CLINICAL DATA:  Shortness of breath EXAM: PORTABLE CHEST 1 VIEW COMPARISON:  09/27/2020 FINDINGS: Cardiac shadow is within normal limits. Aortic calcifications are again seen. Patient rotation to the right accentuates the mediastinal markings but stable. This is similar to fibrotic changes seen on prior CT. Persistent density is noted in the medial aspect of the left apex similar to that seen on prior CT exam. Lungs are hyperinflated bilaterally. No bony abnormality noted. IMPRESSION: Stable changes in the medial paramediastinal region and left apex medially consistent with the given clinical history lung carcinoma. Stable changes of COPD. Electronically Signed   By: Inez Catalina M.D.   On: 01/22/2021 01:28     ASSESSMENT AND PLAN: Patient presenting to the emergency department with worsening dyspnea found to have most likely COPD exacerbation as well as HFpEF exacerbation.  Will check echocardiogram for HFpEF vs cor pulmonale.  Troponin increase most likely due  to demand ischemia.  As patient denies chest pain and with evidence of old infarct on EKG patient can have outpatient ischemic work-up.  Would recommend continuing IV Lasix for now given significant BLE pitting edema.  Plan to adjust medications based on echocardiogram results.  We will continue to follow  Adaline Sill NP-C

## 2021-01-22 NOTE — CV Procedure (Signed)
Pt resting comfortably at this time. NAD noted. Pt currently on 2L//min via [place]. Call bell in reach.

## 2021-01-22 NOTE — ED Triage Notes (Addendum)
75 y/o male arrived to the [facility] by EMS with a CC of SOB. EMS states pt was in the low 90s on RA SAT. PT notes he was at the ER last Friday for SOB and was sent home. Pt has history of COPD and isnt on any oxygen at home. Pt also has a history of lung cancer stage 4 but is in remisson. EMS gave pt 2 duo nebulizing treatments and 125 of IV solumedrol for his breathing. Upon assessment pt has coarse breath sounds accompanied with expiratory wheezing  Pt is A&Ox4

## 2021-01-22 NOTE — H&P (Addendum)
[place]   PATIENT NAME: Colin Novak    MR#:  [id number]  DATE OF BIRTH:  1945-11-26  DATE OF ADMISSION:  01/22/2021  PRIMARY CARE PHYSICIAN: Patient, No Pcp Per   Patient is coming from: Home.  REQUESTING/REFERRING PHYSICIAN: Ward, Delice Bison, DO  CHIEF COMPLAINT:   Chief Complaint  Patient presents with  . Shortness of Breath    HISTORY OF PRESENT ILLNESS:  Colin Novak is a 75 y.o. cachectic Caucasian male with medical history significant for COPD, type diabetes mellitus, GERD, dyslipidemia and anxiety as well as lung cancer, presented to the emergency room with a [place] of worsening dyspnea over the last couple of days with associated cough productive of clear whitish sputum as well as wheezing.  He denies any fever or chills.  No chest pain or palpitations.  No nausea or vomiting or abdominal pain.  No dysuria, oliguria or hematuria or flank pain.  Pulsoxymeter is 90% on room air.  ED Course: When the patient came to the ER, vital signs were within normal.  Labs revealed hypokalemia of 3.4 and blood glucose was 183 with a calcium of 8.8.  BNP was 293.8 and high-sensitivity 0.9 was 37 and later 95.  CBC showed anemia with hemoglobin of 11.5 hematocrit 34.7.  Influenza antigens and COVID-19 PCR came back negative.   EKG as reviewed by me : Showed normal sinus rhythm with rate of 94 with Q waves anteroseptally with T wave inversion. Imaging: Chest x-ray showed stable changes in the medial paramediastinal region left apex medially consistent with history of lung carcinoma.  It showed stable changes of COPD.  The patient was given numbers albuterol, as well as Atrovent and to DuoNeb.  With IV Solu-Medrol by EMS earlier.  He will be admitted to a progressive unit bed for further evaluation and management. PAST MEDICAL HISTORY:   Past Medical History:  Diagnosis Date  . Anxiety   . Cancer ([place])   . COPD (chronic obstructive pulmonary disease) ([place])   . Diabetes  mellitus without complication ([place])    type 2  . GERD (gastroesophageal reflux disease)   . Hyperlipidemia     PAST SURGICAL HISTORY:   Past Surgical History:  Procedure Laterality Date  . APPENDECTOMY    . ENDOBRONCHIAL ULTRASOUND Bilateral 06/13/2019   Procedure: ENDOBRONCHIAL ULTRASOUND, BILATERAL;  Surgeon: [name], MD;  Location: [facility] ORS;  Service: Pulmonary;  Laterality: Bilateral;  . EYE SURGERY Bilateral    cataract    SOCIAL HISTORY:   Social History   Tobacco Use  . Smoking status: Current Every Day Smoker    Packs/day: 1.00    Types: Cigarettes  . Smokeless tobacco: Former Systems developer    Types: Chew    Quit date: 06/10/1959  . Tobacco comment: 1 pack daily- 06/09/2019  Substance Use Topics  . Alcohol use: No    FAMILY HISTORY:   Family History  Problem Relation Age of Onset  . Cancer Mother        lung (non smoker)  . Diabetes Father   . Hypertension Father   . Lymphoma Brother   . Prostate cancer Brother     DRUG ALLERGIES:   Allergies  Allergen Reactions  . Penicillins Rash    REVIEW OF SYSTEMS:   ROS As per history of present illness. All pertinent systems were reviewed above. Constitutional, HEENT, cardiovascular, respiratory, GI, GU, musculoskeletal, neuro, psychiatric, endocrine, integumentary and hematologic systems were reviewed and are otherwise negative/unremarkable except  for positive findings mentioned above in the HPI.   MEDICATIONS AT HOME:   Prior to Admission medications   Medication Sig Start Date End Date Taking? Authorizing Provider  acetaminophen (TYLENOL) 500 MG tablet Take 1,000 mg by mouth every 12 (twelve) hours.   Yes [provider]  albuterol (PROAIR HFA) 108 (90 Base) MCG/ACT inhaler Inhale 1-2 puffs into the lungs every 6 (six) hours as needed for wheezing or shortness of breath (use as needed for chest congestion.). 06/09/19  Yes [name], MD  aspirin EC 81 MG tablet Take 81 mg by mouth  daily.   Yes [provider]  fludrocortisone (FLORINEF) 0.1 MG tablet Take 100 mcg by mouth daily. 08/24/20  Yes [provider]  HUMALOG KWIKPEN 100 UNIT/ML KwikPen Inject 14 Units into the skin as needed. Sliding scale up to 12 units 06/14/19  Yes [provider]  LANTUS SOLOSTAR 100 UNIT/ML Solostar Pen INJECT 10-20 UNITS INTO THE SKIN DAILY Patient taking differently: Inject 12 Units into the skin at bedtime. 04/27/17  Yes Crissman, Jeannette How, MD  metFORMIN (GLUCOPHAGE) 500 MG tablet TAKE ONE TABLET BY MOUTH TWICE DAILY Patient taking differently: Take 1,000 mg by mouth daily with supper. 12/22/16  Yes Crissman, Jeannette How, MD  Multiple Vitamin (MULTIVITAMIN) tablet Take 1 tablet by mouth daily.    Yes [provider]  Phenylephrine-APAP-guaiFENesin ([place] FAST-MAX) 10-650-400 MG/20ML LIQD Take 20 mLs by mouth 2 (two) times daily as needed. Takes mainly at bedtime, will take occasionally during the day due to coughing   Yes [provider]  citalopram (CELEXA) 20 MG tablet Take 1 tablet (20 mg total) by mouth daily. Patient not taking: No sig reported 12/06/18   Guadalupe Maple, MD  Dextromethorphan-guaiFENesin (ROBITUSSIN DM PO) Take 5 mLs by mouth at bedtime.  Patient not taking: Reported on 01/22/2021    [provider]  FAMOTIDINE MAXIMUM STRENGTH PO Take 20 mg by mouth daily. Patient not taking: No sig reported    [provider]  glucose blood (ONE TOUCH ULTRA TEST) test strip 1 each by Other route 2 (two) times daily as needed for other. Dx: E11.9 07/19/15   Guadalupe Maple, MD  loperamide (IMODIUM) 2 MG capsule Take 1 capsule (2 mg total) by mouth See admin instructions. Initial: 4 mg, followed by 2 mg after each loose stool; maximum: 16 mg/day Patient not taking: No sig reported 08/27/20   Earlie Server, MD  megestrol (MEGACE) 400 MG/10ML suspension Take 10 mLs (400 mg total) by mouth daily. Patient not taking: Reported on 01/22/2021  12/18/20   Verlon Au, NP  Morphine Sulfate (MORPHINE CONCENTRATE) 10 mg / 0.5 ml concentrated solution Take 0.5 mLs (10 mg total) by mouth every 6 (six) hours as needed for moderate pain or severe pain. Patient not taking: No sig reported 09/27/20   Earlie Server, MD  NOVOFINE 32G X 6 MM MISC USE AS DIRECTED. 5 INJECTIONS A DAY 05/05/16   Johnson, Megan P, DO  prochlorperazine (COMPAZINE) 10 MG tablet Take 1 tablet (10 mg total) by mouth every 6 (six) hours as needed (Nausea or vomiting). Patient not taking: Reported on 01/13/2020 06/20/19 01/16/20  Earlie Server, MD      VITAL SIGNS:  Blood pressure 100/65, pulse (!) 107, temperature 98 F (36.7 C), temperature source Oral, resp. rate 20, weight 55.6 kg, SpO2 93 %.  PHYSICAL EXAMINATION:  Physical Exam  GENERAL:  75 y.o.-year-old cachectic Caucasian patient lying in the bed with  mild respiratory distress with conversational dyspnea. EYES: Pupils equal, round, reactive to light and accommodation. No scleral icterus. Extraocular muscles intact.  HEENT: Head atraumatic, normocephalic. Oropharynx and nasopharynx clear.  NECK:  Supple, no jugular venous distention. No thyroid enlargement, no tenderness.  LUNGS: Diffuse expiratory wheezes with tight expiratory airflow and harsh physical breathing with mild bibasilar rales. CARDIOVASCULAR: Regular rate and rhythm, S1, S2 normal. No murmurs, rubs, or gallops.  ABDOMEN: Soft, nondistended, nontender. Bowel sounds present. No organomegaly or mass.  EXTREMITIES: 3-4 bilateral lower extremity pitting edema, with no cyanosis, or clubbing.  NEUROLOGIC: Cranial nerves II through XII are intact. Muscle strength 5/5 in all extremities. Sensation intact. Gait not checked.  PSYCHIATRIC: The patient is alert and oriented x 3.  Normal affect and good eye contact. SKIN: No obvious rash, lesion, or ulcer.   LABORATORY PANEL:   CBC Recent Labs  Lab 01/22/21 0423  WBC 6.5  HGB 11.9*  HCT 34.6*  PLT 225    ------------------------------------------------------------------------------------------------------------------  Chemistries  Recent Labs  Lab 01/18/21 1540 01/22/21 0104  NA 141 141  K 4.0 3.4*  CL 104 103  CO2 27 29  GLUCOSE 81 183*  BUN 20 12  CREATININE 0.40* 0.46*  CALCIUM 8.7* 8.8*  AST 36  --   ALT 25  --   ALKPHOS 69  --   BILITOT 0.8  --    ------------------------------------------------------------------------------------------------------------------  Cardiac Enzymes No results for input(s): TROPONINI in the last 168 hours. ------------------------------------------------------------------------------------------------------------------  RADIOLOGY:  DG Chest Portable 1 View  Result Date: 01/22/2021 CLINICAL DATA:  Shortness of breath EXAM: PORTABLE CHEST 1 VIEW COMPARISON:  09/27/2020 FINDINGS: Cardiac shadow is within normal limits. Aortic calcifications are again seen. Patient rotation to the right accentuates the mediastinal markings but stable. This is similar to fibrotic changes seen on prior CT. Persistent density is noted in the medial aspect of the left apex similar to that seen on prior CT exam. Lungs are hyperinflated bilaterally. No bony abnormality noted. IMPRESSION: Stable changes in the medial paramediastinal region and left apex medially consistent with the given clinical history lung carcinoma. Stable changes of COPD. Electronically Signed   By: Inez Catalina M.D.   On: 01/22/2021 01:28      IMPRESSION AND PLAN:  Active Problems:   COPD exacerbation ([place])  1.  COPD acute exacerbation. -The patient will be admitted to a progressive unit bed. -We will continue steroid therapy with Solu-Medrol as well as bronchodilator therapy with DuoNebs 4 times daily and every 4 hours as needed. -We will place the patient on IV Rocephin and Zithromax. -We will obtain a sputum culture and sensitivity. -Mucolytic therapy will be provided.  2.  Elevated BNP  concerning about acute CHF, possibly cor pulmonale. -The patient will be diuresed with IV Lasix especially given associated significant lower extremity edema. -We will follow serial troponin I's. -We will obtain a cardiology consultation and 2D echo.  3.  Elevated troponin I concerning for demand ischemia with acute CHF.  Differential diagnosis would include non-STEMI.  The patient is chest pain-free. -We will follow serial troponin and place the patient on aspirin, high-dose statin.  Beta-blocker can be started with euvolemia. -2D echo and cardiology consult will be obtained as mentioned above. -I notified Dr. Humphrey Rolls about the patient.  4.  Type II diabetes mellitus. -The patient will be placed on supplement coverage with NovoLog. -We will hold Metformin and continue basal coverage.  5.  Depression. -We will continue Celexa.  6.  Non-small cell lung cancer. -Pain management will be provided.  DVT prophylaxis: The patient will be on IV heparin. Code Status: The patient is DNR/DNI.  He has an out of facility DNR form. Family Communication:  The plan of care was discussed in details with the patient who requested no family members to be notified at this time. I answered all questions. The patient agreed to proceed with the above mentioned plan. Further management will depend upon hospital course. Disposition Plan: Back to previous home environment Consults called: Cardiology consult.  All the records are reviewed and case discussed with ED provider.  Status is: Inpatient  Remains inpatient appropriate because:Ongoing diagnostic testing needed not appropriate for outpatient work up, Unsafe d/c plan, IV treatments appropriate due to intensity of illness or inability to take PO and Inpatient level of care appropriate due to severity of illness   Dispo: The patient is from: Home              Anticipated d/c is to: Home              Patient currently is not medically stable to d/c.    Difficult to place patient No  TOTAL TIME TAKING CARE OF THIS PATIENT: 55 minutes.    Christel Mormon M.D on 01/22/2021 at 4:59 AM  [facility]   From 7 PM-7 AM, contact night-coverage [email or web address]  CC: Primary care physician; Patient, No Pcp Per

## 2021-01-22 NOTE — ED Notes (Signed)
[facility] care RN, Horris Latino called in for this pt and reported that he lives alone and is under hospice care for lung cancer. Pt has been having fluid buildup in bilateral lower extremities. Daughter to contact for more information is: Vaughan Browner [phone]

## 2021-01-22 NOTE — Progress Notes (Signed)
Brief hospitalist update note.  This is a nonbillable note.  Please see same-day H&P for full billable details.  Briefly, this is a 75 year old male with history significant for advanced COPD, diabetes, GERD, hyperlipidemia, anxiety who presents for evaluation of worsening dyspnea over the last several days with associated cough productive of whitish sputum and audible wheezing.  Mild hypoxia noted on admission, this is resolved.  Patient also had minimal elevation of troponin.  Initially concern for ACS of heparin GTT was started.  This was discontinued.  Cardiology was consulted by the admitting physician who feel that presentation is more consistent with congestive heart failure/COPD rather than ACS.  No indication for heparin GTT or ischemic evaluation.  Plan: IV Solu-Medrol Bronchodilator therapy Wean oxygen as tolerated Continue IV Lasix  Offered to call patient's family.  He declined  Ralene Muskrat MD

## 2021-01-22 NOTE — Progress Notes (Signed)
ANTICOAGULATION CONSULT NOTE - Initial Consult  Pharmacy Consult for Heparin  Indication: chest pain/ACS  Allergies  Allergen Reactions  . Penicillins Rash    Patient Measurements: Weight: 55.6 kg (122 lb 8 oz) Heparin Dosing Weight: 76.5 kg   Vital Signs: Temp: 98 F (36.7 C) (03/29 0053) Temp Source: Oral (03/29 0053) BP: 100/65 (03/29 0430) Pulse Rate: 107 (03/29 0430)  Labs: Recent Labs    01/22/21 0104 01/22/21 0250 01/22/21 0423  HGB 11.5*  --  11.9*  HCT 34.7*  --  34.6*  PLT 236  --  225  CREATININE 0.46*  --  0.46*  TROPONINIHS 37* 95*  --     Estimated Creatinine Clearance: 62.7 mL/min (A) (by C-G formula based on SCr of 0.46 mg/dL (L)).   Medical History: Past Medical History:  Diagnosis Date  . Anxiety   . Cancer ([place])   . COPD (chronic obstructive pulmonary disease) ([place])   . Diabetes mellitus without complication ([place])    type 2  . GERD (gastroesophageal reflux disease)   . Hyperlipidemia     Medications:  (Not in a hospital admission)   Assessment: Pharmacy consulted to dose heparin in this 75 year old male admitted with ACS/NSTEMI.   No prior anticoag noted. CrCl = 62.7 ml/min   Goal of Therapy:  Heparin level 0.3-0.7 units/ml Monitor platelets by anticoagulation protocol: Yes   Plan:  Give 4000 units bolus x 1 Start heparin infusion at 900 units/hr Check anti-Xa level in 8 hours and daily while on heparin Continue to monitor H&H and platelets  Refugia Laneve D 01/22/2021,5:41 AM

## 2021-01-22 NOTE — ED Notes (Signed)
Food tray given to pt

## 2021-01-22 NOTE — Progress Notes (Signed)
[facility] Room ED [address] [facility]) [place] patient RN note:  Branden Shallenberger. Ehrman is a current hospice patient with a terminal diagnosis of lung cancer. He activated EMS on the evening of 03.28 for worsening shortness of breath. He contacted hospice to notify of transport to the hospital. He was admitted to [facility] on 03.29.22 with a diagnosis of COPD and CHF exacerbation. He is a DNR. Per Dr. Gilford Rile with [facility], this is a related admission.  He presented to the emergency room with complaints of worsening dyspnea over the last couple of days with associated cough productive of clear whitish sputum as well as wheezing.  He denied any fever or chills. No chest pain or palpitations. No nausea or vomiting or abdominal pain. No dysuria, oliguria or hematuria or flank pain. Pulsoxymeter was 90% on room air. On presentation to the ED, his vital signs were normal. Labs revealed hypokalemia of 3.4 and blood glucose was 183 with a calcium of 8.8.  BNP was 293.8 and high-sensitivity 0.9 was 37 and later 95. CBC showed anemia with hemoglobin of 11.5 hematocrit 34.7.  Influenza antigens and COVID-19 PCR came back negative.   EKG showed normal sinus rhythm with rate of 94 with Q waves anteroseptally with T wave inversion. Imaging: Chest x-ray showed stable changes in the medial paramediastinal region left apex medially consistent with history of lung carcinoma.  It showed stable changes of COPD.  The patient was given numbers albuterol, as well as Atrovent and to DuoNeb.  With IV Solu-Medrol by EMS earlier.  He was admitted to a progressive unit bed for further evaluation and management.  Visited patient at bedside. He was sitting up in bed. Alert and oriented. Stated that he was breathing much better. He was on RA.  Bilateral lower extremity edema noted. Patient states that it was improved. Report exchanged with hospital care team. Offered to call patient's daughter but  he declined. Plan is for IV solumedrol, Bronchodilator therapy, wean O2 and IV lasix.  Vital Signs: BP 110/69; HR 116; Resp 19; Temp 98; O2 sat 92% on RA  I&O:231ml/no output charted  Abnormal labs: Glucose: 285 (H) Creatinine: 0.46 (L) Calcium: 8.7 (L) B Natriuretic Peptide: 463.7 (H) RBC: 3.46 (L) Hemoglobin: 11.9 (L) HCT: 34.6 (L) MCH: 34.4 (H)  Diagnostics: CXR IMPRESSION: Stable changes in the medial paramediastinal region and left apex medially consistent with the given clinical history lung carcinoma. Stable changes of COPD.  IV/PRN Meds: arformoterol (BROVANA) nebulizer solution 15 mcg Dose: 15 mcg Freq: 2 times daily Route: NEBULIZATION  budesonide (PULMICORT) nebulizer solution 0.25 mg Dose: 0.25 mg Freq: 2 times daily Route: NEBULIZATION  furosemide (LASIX) injection 40 mg Dose: 40 mg Freq: Every 12 hours Route: IV Start: 01/22/21 0530  ipratropium-albuterol (DUONEB) 0.5-2.5 (3) MG/3ML nebulizer solution 3 mL Dose: 3 mL Freq: 4 times daily Route: NEBULIZATION Start: 01/22/21 0800  methylPREDNISolone sodium succinate (SOLU-MEDROL) 40 mg/mL injection 40 mg Dose: 40 mg Freq: Every 8 hours Route: IV Start: 01/22/21 1400 End: 01/24/21 1359  azithromycin (ZITHROMAX) 500 mg in sodium chloride 0.9 % 250 mL IVPB Dose: 500 mg Freq: Every 24 hours Route: IV Last Dose: Stopped (01/22/21 0702) Start: 01/22/21 0500  0.9 % sodium chloride infusion Rate: 75 mL/hr Freq: Continuous Route: IV Last Dose: Stopped (01/22/21 0950) Start: 01/22/21 0330 End: 01/22/21 0726  cefTRIAXone (ROCEPHIN) 1 g in sodium chloride 0.9 % 100 mL IVPB Dose: 1 g Freq: Every 24 hours Route: IV Last Dose: Stopped (  01/22/21 0524) Start: 01/22/21 0500 End: 01/22/21 0729  heparin ADULT infusion 100 units/mL (25000 units/241mL) Rate: 9 mL/hr Dose: 900 Units/hr Freq: Continuous Route: IV Last Dose: Stopped (01/22/21 0835) Start: 01/22/21 0545 End: 01/22/21 1941  Problem List: Active  Problems:   COPD exacerbation ([place])  1.  COPD acute exacerbation. -The patient will be admitted to a progressive unit bed. -We will continue steroid therapy with Solu-Medrol as well as bronchodilator therapy with DuoNebs 4 times daily and every 4 hours as needed. -We will place the patient on IV Rocephin and Zithromax. -We will obtain a sputum culture and sensitivity. -Mucolytic therapy will be provided.  2.  Elevated BNP concerning about acute CHF, possibly cor pulmonale. -The patient will be diuresed with IV Lasix especially given associated significant lower extremity edema. -We will follow serial troponin I's. -We will obtain a cardiology consultation and 2D echo.  3.  Elevated troponin I concerning for demand ischemia with acute CHF.  Differential diagnosis would include non-STEMI.  The patient is chest pain-free. -We will follow serial troponin and place the patient on aspirin, high-dose statin.  Beta-blocker can be started with euvolemia. -2D echo and cardiology consult will be obtained as mentioned above. -I notified Dr. Humphrey Rolls about the patient.  4.  Type II diabetes mellitus. -The patient will be placed on supplement coverage with NovoLog. -We will hold Metformin and continue basal coverage.  5.  Depression. -We will continue Celexa.  6.  Non-small cell lung cancer. -Pain management will be provided.  Discharge Planning: Discharge home once medically ready.  Family Contact: Spoke with patient at bedside. He declined for me to call daughter.  IDG: Updated  Goals of care: Clear  Medication list and Transfer Summary placed on Shadow Chart.  Please call with any hospice related questions or concerns.  Zandra Abts, RN [facility] Liaison  [phone]

## 2021-01-22 NOTE — ED Notes (Signed)
Hospice Nurse at bedside. Nurse gave a copy of pt med list

## 2021-01-22 NOTE — ED Notes (Signed)
Unable to give medication at appointed time due to pyxis malfunction

## 2021-01-23 ENCOUNTER — Other Ambulatory Visit: Payer: Self-pay

## 2021-01-23 ENCOUNTER — Inpatient Hospital Stay
Admit: 2021-01-23 | Discharge: 2021-01-23 | Disposition: A | Discharge status: Home / Self Care | Attending: Family Medicine | Admitting: Family Medicine

## 2021-01-23 DIAGNOSIS — F32A Depression, unspecified: Secondary | ICD-10-CM

## 2021-01-23 DIAGNOSIS — I509 Heart failure, unspecified: Secondary | ICD-10-CM

## 2021-01-23 LAB — BLOOD CULTURE ID PANEL (REFLEXED) - BCID2

## 2021-01-23 LAB — ECHOCARDIOGRAM COMPLETE
AR max vel: 2.73 cm2
AV Area VTI: 2.54 cm2
AV Area mean vel: 2.81 cm2
AV Mean grad: 2 mmHg
AV Peak grad: 3.2 mmHg
Ao pk vel: 0.9 m/s
Area-P 1/2: 5.38 cm2
Height: 68 in
MV VTI: 2.91 cm2
S' Lateral: 2.2 cm
Weight: 1902.4 oz

## 2021-01-23 LAB — BASIC METABOLIC PANEL
Anion gap: 7 (ref 5–15)
BUN: 21 mg/dL (ref 8–23)
CO2: 26 mmol/L (ref 22–32)
Calcium: 8.8 mg/dL — ABNORMAL LOW (ref 8.9–10.3)
Chloride: 106 mmol/L (ref 98–111)
Creatinine, Ser: 0.4 mg/dL — ABNORMAL LOW (ref 0.61–1.24)
GFR, Estimated: >60 mL/min (ref >60)
Glucose, Bld: 133 mg/dL — ABNORMAL HIGH (ref 70–99)
Potassium: 3.9 mmol/L (ref 3.5–5.1)
Sodium: 139 mmol/L (ref 135–145)

## 2021-01-23 LAB — GLUCOSE, CAPILLARY
Glucose-Capillary: 153 mg/dL — ABNORMAL HIGH (ref 70–99)
Glucose-Capillary: 180 mg/dL — ABNORMAL HIGH (ref 70–99)
Glucose-Capillary: 360 mg/dL — ABNORMAL HIGH (ref 70–99)
Glucose-Capillary: 390 mg/dL — ABNORMAL HIGH (ref 70–99)
Glucose-Capillary: 450 mg/dL — ABNORMAL HIGH (ref 70–99)

## 2021-01-23 LAB — CBC
HCT: 34.3 % — ABNORMAL LOW (ref 39.0–52.0)
Hemoglobin: 11.2 g/dL — ABNORMAL LOW (ref 13.0–17.0)
MCH: 33.8 pg (ref 26.0–34.0)
MCHC: 32.7 g/dL (ref 30.0–36.0)
MCV: 103.6 fL — ABNORMAL HIGH (ref 80.0–100.0)
Platelets: 239 10*3/uL (ref 150–400)
RBC: 3.31 MIL/uL — ABNORMAL LOW (ref 4.22–5.81)
RDW: 15.4 % (ref 11.5–15.5)
WBC: 6.5 10*3/uL (ref 4.0–10.5)
nRBC: 0 % (ref 0.0–0.2)

## 2021-01-23 LAB — GLUCOSE, RANDOM: Glucose, Bld: 466 mg/dL — ABNORMAL HIGH (ref 70–99)

## 2021-01-23 MED ORDER — IPRATROPIUM-ALBUTEROL 0.5-2.5 (3) MG/3ML IN SOLN
3.0000 mL | Freq: Three times a day (TID) | RESPIRATORY_TRACT | Status: DC
  Administered 2021-01-23 – 2021-01-25 (×6): 3 mL via RESPIRATORY_TRACT
  Filled 2021-01-23 (×6): qty 3

## 2021-01-23 MED ORDER — ENOXAPARIN SODIUM 40 MG/0.4ML ~~LOC~~ SOLN
40.0000 mg | SUBCUTANEOUS | Status: DC
  Administered 2021-01-23 – 2021-01-26 (×4): 40 mg via SUBCUTANEOUS
  Filled 2021-01-23 (×4): qty 0.4

## 2021-01-23 MED ORDER — AZITHROMYCIN 250 MG PO TABS
500.0000 mg | ORAL_TABLET | Freq: Every day | ORAL | Status: AC
  Administered 2021-01-24 – 2021-01-26 (×3): 500 mg via ORAL
  Filled 2021-01-23 (×3): qty 2

## 2021-01-23 MED ORDER — ENSURE ENLIVE PO LIQD
237.0000 mL | Freq: Three times a day (TID) | ORAL | Status: DC
  Administered 2021-01-23 – 2021-01-27 (×6): 237 mL via ORAL

## 2021-01-23 NOTE — Progress Notes (Signed)
PHARMACIST - PHYSICIAN COMMUNICATION DR:  TRH CONCERNING: Antibiotic IV to Oral Route Change Policy  RECOMMENDATION: This patient is receiving azithromycin by the intravenous route.  Based on criteria approved by the Pharmacy and Therapeutics Committee, the antibiotic(s) is/are being converted to the equivalent oral dose form(s).   DESCRIPTION: These criteria include:  Patient being treated for a respiratory tract infection, urinary tract infection, cellulitis or clostridium difficile associated diarrhea if on metronidazole  The patient is not neutropenic and does not exhibit a GI malabsorption state  The patient is eating (either orally or via tube) and/or has been taking other orally administered medications for a least 24 hours  The patient is improving clinically and has a Tmax < 100.5  If you have questions about this conversion, please contact the Pharmacy Department  []   [phone] )  [facility] [x]   [phone] )  [facility] []   [phone] )  [facility] []   [phone] )  [facility] []   [phone] )  [place], PharmD, BCPS.   Work Cell: [phone] 01/23/2021 9:58 AM

## 2021-01-23 NOTE — Progress Notes (Signed)
PHARMACY - PHYSICIAN COMMUNICATION CRITICAL VALUE ALERT - BLOOD CULTURE IDENTIFICATION (BCID)  Colin Novak is an 75 y.o. male who presented to [facility] on 01/22/2021 with a chief complaint of COPD exacerbation.   Assessment:  Staph epi in 1 of 4 bottles, no resistance detected.  (include suspected source if known)  Name of physician (or Provider) Contacted: Sharion Settler, NP   Current antibiotics: Azithromycin 500 mg IV Q24H  Changes to prescribed antibiotics recommended:  Patient is on recommended antibiotics - No changes needed  Results for orders placed or performed during the hospital encounter of 01/22/21  Blood Culture ID Panel (Reflexed) (Collected: 01/22/2021  4:23 AM)  Result Value Ref Range   Enterococcus faecalis NOT DETECTED NOT DETECTED   Enterococcus Faecium NOT DETECTED NOT DETECTED   Listeria monocytogenes NOT DETECTED NOT DETECTED   Staphylococcus species DETECTED (A) NOT DETECTED   Staphylococcus aureus (BCID) NOT DETECTED NOT DETECTED   Staphylococcus epidermidis DETECTED (A) NOT DETECTED   Staphylococcus lugdunensis NOT DETECTED NOT DETECTED   Streptococcus species NOT DETECTED NOT DETECTED   Streptococcus agalactiae NOT DETECTED NOT DETECTED   Streptococcus pneumoniae NOT DETECTED NOT DETECTED   Streptococcus pyogenes NOT DETECTED NOT DETECTED   A.calcoaceticus-baumannii NOT DETECTED NOT DETECTED   Bacteroides fragilis NOT DETECTED NOT DETECTED   Enterobacterales NOT DETECTED NOT DETECTED   Enterobacter cloacae complex NOT DETECTED NOT DETECTED   Escherichia coli NOT DETECTED NOT DETECTED   Klebsiella aerogenes NOT DETECTED NOT DETECTED   Klebsiella oxytoca NOT DETECTED NOT DETECTED   Klebsiella pneumoniae NOT DETECTED NOT DETECTED   Proteus species NOT DETECTED NOT DETECTED   Salmonella species NOT DETECTED NOT DETECTED   Serratia marcescens NOT DETECTED NOT DETECTED   Haemophilus influenzae NOT DETECTED NOT DETECTED   Neisseria meningitidis NOT  DETECTED NOT DETECTED   Pseudomonas aeruginosa NOT DETECTED NOT DETECTED   Stenotrophomonas maltophilia NOT DETECTED NOT DETECTED   Candida albicans NOT DETECTED NOT DETECTED   Candida auris NOT DETECTED NOT DETECTED   Candida glabrata NOT DETECTED NOT DETECTED   Candida krusei NOT DETECTED NOT DETECTED   Candida parapsilosis NOT DETECTED NOT DETECTED   Candida tropicalis NOT DETECTED NOT DETECTED   Cryptococcus neoformans/gattii NOT DETECTED NOT DETECTED   Methicillin resistance mecA/C NOT DETECTED NOT DETECTED    [name] 01/23/2021  2:40 AM

## 2021-01-23 NOTE — Evaluation (Signed)
Physical Therapy Evaluation Patient Details Name: Colin Novak MRN: [id number] DOB: [birth year] Today's Date: 01/23/2021   History of Present Illness  Colin Novak is a 64yoM followed by hospice at home 2/2 StgIV [name] comes to [facility] on 3/29 c progression of SOB, LEE x8-10 days. PMH: ED post syncope at home 01/18/21. stg4 LungCA in remission, COPD, GAD, DM2, HLD, GERD.  Pt lives alone at home, ADL support from Hospice, chronic DOE limiting >household AMB distances.  1 recent fall in 6 months 2/2 syncope which pt reports was related to acute BG drop.  Clinical Impression  Pt admitted with above diagnosis. Pt currently with functional limitations due to the deficits listed below (see "PT Problem List"). Upon entry, pt in bed, awake and agreeable to participate. The pt is alert, pleasant, interactive, and able to provide info regarding prior level of function, both in tolerance and independence. Pt recently up and AMB out of room with mobility specialist, reports to feel 90% to baseline for AMB tolerance, however unsteadiness while up remains acutely and significantly exacerbated. Pt has 1 big LOB AMB in room unsupported, but gait is generally ataxic and unsteady-suspect pt can be safe at home with use of walker (which he has already.) Pt would be wise to attempt stairs performance prior to DC as he has no railings. Patient's performance this date reveals decreased ability, independence, and tolerance in performing all basic mobility required for performance of activities of daily living. Pt requires additional DME, close physical assistance, and cues for safe participate in mobility. Pt will benefit from skilled PT intervention to increase independence and safety with basic mobility in preparation for discharge to the venue listed below.        Follow Up Recommendations Other (comment);Supervision - Intermittent (services and equipment are management by hospice)    Equipment Recommendations  Other  (comment) (services and equipment are management by hospice)    Recommendations for Other Services       Precautions / Restrictions Precautions Precautions: Fall Restrictions Weight Bearing Restrictions: No      Mobility  Bed Mobility Overal bed mobility: Modified Independent             General bed mobility comments: mod effort, full confidence    Transfers Overall transfer level: Needs assistance Equipment used: None Transfers: Sit to/from Stand Sit to Stand: Min guard         General transfer comment: clear balance impairment, pt should use a RW for transfers at this time.  Ambulation/Gait Ambulation/Gait assistance: Min assist Gait Distance (Feet): 30 Feet Assistive device: None Gait Pattern/deviations: Ataxic;Scissoring     General Gait Details: obvious unsteadiness, makes attempt to AMB unsupported, but 1 full LOB turning around at doorway, self righting with rapid LUE thurst at wall, minA support of pelvis for safety.  Stairs            Wheelchair Mobility    Modified Rankin (Stroke Patients Only)       Balance Overall balance assessment: Needs assistance Sitting-balance support: No upper extremity supported;Feet supported Sitting balance-Leahy Scale: Good     Standing balance support: Single extremity supported;During functional activity Standing balance-Leahy Scale: Poor                               Pertinent Vitals/Pain Pain Assessment:  (some moderate rib pain, chronci left knee pain exacerbated.)    Home Living Family/patient expects to be discharged to:: Private  residence Living Arrangements: Alone Available Help at Discharge: Family (hospice patient) Type of Home: House Home Access: Stairs to enter Entrance Stairs-Rails: None Entrance Stairs-Number of Steps: 2 steps front, 3 in back Home Layout: One level Home Equipment: [place] - 2 wheels;Cane - single point;Bedside commode Additional Comments: intermittent  use of hands on furniture at night; no regular use of RW; syncopal fall 5 days ago at home; has  a standard  queen bed modified for elevated HOB    Prior Function Level of Independence: Needs assistance   Gait / Transfers Assistance Needed: household distance AMB only due to DOE limitations;  ADL's / Homemaking Assistance Needed: independent, but stamina is limited due to dyspnea; hospice is assistign with bathing currently        Hand Dominance        Extremity/Trunk Assessment   Upper Extremity Assessment Upper Extremity Assessment: Generalized weakness;Overall [facility] for tasks assessed    Lower Extremity Assessment Lower Extremity Assessment: Generalized weakness;Overall WFL for tasks assessed       Communication   Communication: HOH  Cognition Arousal/Alertness: Awake/alert Behavior During Therapy: WFL for tasks assessed/performed Overall Cognitive Status: Within Functional Limits for tasks assessed                                        General Comments      Exercises     Assessment/Plan    PT Assessment Patient needs continued PT services  PT Problem List Decreased strength;Decreased activity tolerance;Decreased balance;Decreased knowledge of use of DME;Decreased mobility;Decreased safety awareness       PT Treatment Interventions DME instruction;Balance training;Gait training;Stair training;Functional mobility training;Therapeutic activities;Therapeutic exercise;Patient/family education;Neuromuscular re-education    PT Goals (Current goals can be found in the Care Plan section)  Acute Rehab PT Goals Patient Stated Goal: regain baseline independence with AMB PT Goal Formulation: With patient Time For Goal Achievement: 02/06/21 Potential to Achieve Goals: Good    Frequency Min 2X/week   Barriers to discharge Inaccessible home environment;Decreased caregiver support      Co-evaluation               AM-PAC PT "6 Clicks" Mobility   Outcome Measure Help needed turning from your back to your side while in a flat bed without using bedrails?: A Little Help needed moving from lying on your back to sitting on the side of a flat bed without using bedrails?: A Little Help needed moving to and from a bed to a chair (including a wheelchair)?: A Little Help needed standing up from a chair using your arms (e.g., wheelchair or bedside chair)?: A Little Help needed to walk in hospital room?: A Lot Help needed climbing 3-5 steps with a railing? : A Lot 6 Click Score: 16    End of Session   Activity Tolerance: Patient limited by fatigue;No increased pain Patient left: in bed;with family/visitor present;with call bell/phone within reach   PT Visit Diagnosis: Unsteadiness on feet (R26.81);Difficulty in walking, not elsewhere classified (R26.2);Other abnormalities of gait and mobility (R26.89);Muscle weakness (generalized) (M62.81)    Time: 3220-2542 PT Time Calculation (min) (ACUTE ONLY): 15 min   Charges:   PT Evaluation $PT Eval Moderate Complexity: 1 Mod         4:21 PM, 01/23/21 Etta Grandchild, PT, DPT Physical Therapist - [facility]  [phone] ([place])    Zeyna Mkrtchyan C 01/23/2021, 4:16  PM

## 2021-01-23 NOTE — Progress Notes (Signed)
Colin Novak Colin Novak ([place] patient RN note:  Colin Novak. Colin Novak is a current hospice patient with a terminal diagnosis of lung cancer. He activated EMS on the evening of 03.28 for worsening shortness of breath. He contacted hospice to notify of transport to the hospital. He was admitted to [facility] on 03.29.22 with a diagnosis of COPD and CHF exacerbation. He is a DNR. Per Dr. Gilford Rile with [facility], this is a related admission.  Visited with patient and daughter, Colin Novak at bedside. Patient is resting and appears in not distress. Edema has improved in bilateral feet and legs. He is off oxygen. States his dyspnea is much better. Plan is to complete an echocardiogram and continue IV Lasix. Also on antibiotics for staph detection in blood cultures. Colin Novak voiced that she and family were making plans to stay with patient when he was discharged. Listened and offered support.  Vital Signs: BP 91/56; HR 74; Resp 18, Temp 97.8; O2 sat 99% on RA  I & O: 367ml/400ml  Abnormal Labs: Glucose: 133 (H) Creatinine: 0.40 (L) Calcium: 8.8 (L)  Diagnostics: Echo done -not resulted  IV/PRN meds: furosemide (LASIX) injection 40 mg Dose: 40 mg Freq: Every 12 hours Route: IV Start: 01/22/21 0530  methylPREDNISolone sodium succinate (SOLU-MEDROL) 40 mg/mL injection 40 mg Dose: 40 mg Freq: Every 8 hours Route: IV Start: 01/22/21 1400 End: 01/24/21 1359  0.9 % sodium chloride infusion Rate: 75 mL/hr Freq: Continuous Route: IV Last Dose: Stopped (01/22/21 0950)  azithromycin (ZITHROMAX) 500 mg in sodium chloride 0.9 % 250 mL IVPB Dose: 500 mg Freq: Every 24 hours Route: IV Last Dose: 500 mg (01/23/21 0436) Start: 01/22/21   cefTRIAXone (ROCEPHIN) 1 g in sodium chloride 0.9 % 100 mL IVPB Dose: 1 g Freq: Every 24 hours Route: IV Last Dose: Stopped (01/22/21 0524) Start: 01/22/21 0500 End: 01/22/21 0729  Problem List: Active Problems:   COPD  exacerbation [facility])  Discharge Planning: To home once medically ready  Family Contact: Spoke with Colin Novak at bedside.  IDG: Updated  Goals of Care: clear  Patient remains GIP due to need for IV antibiotics for Staphylococcus present in blood cultures and IV lasix for edema.  Please call with any hospice related questions or concerns.  Zandra Abts, RN [facility] Liaison [phone]

## 2021-01-23 NOTE — Progress Notes (Incomplete)
*  PRELIMINARY RESULTS* Echocardiogram 2D Echocardiogram has been performed.  Wallie Char Heiss 01/23/2021, 1:53 PM

## 2021-01-23 NOTE — Progress Notes (Signed)
Initial Nutrition Assessment  DOCUMENTATION CODES:   Severe malnutrition in context of chronic illness  INTERVENTION:   Ensure Enlive po TID, each supplement provides 350 kcal and 20 grams of protein  MVI po daily   Dysphagia 3 diet   Pt at high refeed risk; recommend monitor potassium, magnesium and phosphorus labs daily until stable  NUTRITION DIAGNOSIS:   Severe Malnutrition related to chronic illness (COPD, NSCLC) as evidenced by severe muscle depletion,severe fat depletion.  GOAL:   Patient will meet greater than or equal to 90% of their needs  MONITOR:   PO intake,Supplement acceptance,Labs,TF tolerance,Skin,I & O's  REASON FOR ASSESSMENT:   Malnutrition Screening Tool    ASSESSMENT:   75 year old male with history significant for advanced NSCLC, COPD, diabetes, GERD, hyperlipidemia and anxiety who is admitted with SOB.   Met with pt in room today. Pt reports fair appetite and oral intake at baseline. Pt reports that he is eating 100% of meals in hospital. Pt reports that he does like supplements but he does not drink these at home. Pt would like to have chocolate Ensure in hospital. RD will add supplements to help pt meet his estimated needs. Pt reports difficulty chewing r/t poor dentition; RD will change pt over to a dysphagia 3 diet. Pt is likely at refeed risk. Per chart, pt appears weight stable at baseline.    Medications reviewed and include: aspirin, azithromycin, celexa, pepcid, lasix, insulin, megace, MVI  Labs reviewed: creat 0.40(L) cbgs- 180, 153 x 24 hrs AIC 12.6(H)- 3/29  NUTRITION - FOCUSED PHYSICAL EXAM:  Flowsheet Row Most Recent Value  Orbital Region Severe depletion  Upper Arm Region Severe depletion  Thoracic and Lumbar Region Severe depletion  Buccal Region Severe depletion  Temple Region Severe depletion  Clavicle Bone Region Severe depletion  Clavicle and Acromion Bone Region Severe depletion  Scapular Bone Region Severe depletion   Dorsal Hand Severe depletion  Patellar Region Severe depletion  Anterior Thigh Region Severe depletion  Posterior Calf Region Severe depletion  Edema (RD Assessment) Moderate  Hair Reviewed  Eyes Reviewed  Mouth Reviewed  Skin Reviewed  Nails Reviewed     Diet Order:   Diet Order            Diet Carb Modified Fluid consistency: Thin; Room service appropriate? Yes  Diet effective now                EDUCATION NEEDS:   Education needs have been addressed  Skin:  Skin Assessment: Reviewed RN Assessment (ecchymosis)  Last BM:  3/30- TYPE 4  Height:   Ht Readings from Last 1 Encounters:  01/22/21 5' 8"  (1.727 m)    Weight:   Wt Readings from Last 1 Encounters:  01/22/21 53.9 kg    Ideal Body Weight:  70 kg  BMI:  Body mass index is 18.08 kg/m.  Estimated Nutritional Needs:   Kcal:  1800-2000kcal/day  Protein:  90-100g/day  Fluid:  1.4-1.6L/day  Koleen Distance MS, RD, LDN Please refer to [facility] for RD and/or RD on-call/weekend/after hours pager

## 2021-01-23 NOTE — Progress Notes (Signed)
SUBJECTIVE: Patient resting comfortably in bed. Continues to state his dyspnea is improving and remains off oxygen. Denies chest pain   Vitals:   01/23/21 0429 01/23/21 0721 01/23/21 0920 01/23/21 1116  BP: 121/75 102/71 93/61 (!) 91/56  Pulse: (!) 101 84 86 74  Resp: 17 18  18   Temp: 98.3 F (36.8 C) 97.9 F (36.6 C) 97.7 F (36.5 C) 97.8 F (36.6 C)  TempSrc: Oral Oral Oral   SpO2: 98% 98% 95% 99%  Weight:      Height:        Intake/Output Summary (Last 24 hours) at 01/23/2021 1201 Last data filed at 01/23/2021 1026 Gross per 24 hour  Intake 610 ml  Output 2400 ml  Net -1790 ml    LABS: Basic Metabolic Panel: Recent Labs    01/22/21 0423 01/23/21 0455  NA 140 139  K 3.5 3.9  CL 103 106  CO2 28 26  GLUCOSE 285* 133*  BUN 13 21  CREATININE 0.46* 0.40*  CALCIUM 8.7* 8.8*   Liver Function Tests: No results for input(s): AST, ALT, ALKPHOS, BILITOT, PROT, ALBUMIN in the last 72 hours. No results for input(s): LIPASE, AMYLASE in the last 72 hours. CBC: Recent Labs    01/22/21 0104 01/22/21 0423  WBC 5.2 6.5  NEUTROABS 3.4  --   HGB 11.5* 11.9*  HCT 34.7* 34.6*  MCV 102.1* 100.0  PLT 236 225   Cardiac Enzymes: No results for input(s): CKTOTAL, CKMB, CKMBINDEX, TROPONINI in the last 72 hours. BNP: Invalid input(s): POCBNP D-Dimer: No results for input(s): DDIMER in the last 72 hours. Hemoglobin A1C: Recent Labs    01/22/21 0104  HGBA1C 12.6*   Fasting Lipid Panel: No results for input(s): CHOL, HDL, LDLCALC, TRIG, CHOLHDL, LDLDIRECT in the last 72 hours. Thyroid Function Tests: No results for input(s): TSH, T4TOTAL, T3FREE, THYROIDAB in the last 72 hours.  Invalid input(s): FREET3 Anemia Panel: No results for input(s): VITAMINB12, FOLATE, FERRITIN, TIBC, IRON, RETICCTPCT in the last 72 hours.   PHYSICAL EXAM General: Well developed, well nourished, in no acute distress HEENT:  Normocephalic and atramatic Neck:  No JVD.  Lungs: Scattered  inspiratory and expiratory wheezes. Heart: HRRR . Normal S1 and S2 without gallops or murmurs.  Abdomen: Bowel sounds are positive, abdomen soft and non-tender  Msk:  Back normal, normal gait. Normal strength and tone for age. Extremities: +3 BLE pitting edema.   Neuro: Alert and oriented X 3. Psych:  Good affect, responds appropriately  TELEMETRY: NSR 76/bpm  ASSESSMENT AND PLAN: Patient presenting to the emergency department with worsening dyspnea found to have most likely COPD exacerbation as well as HFpEF exacerbation. Patient states that even though he has been on hospice he still wants his echocardiogram completed. Previous troponin increase most likely due to demand ischemia.    Recommend continuing IV Lasix dose for now given continued significant BLE pitting edema. We will continue to follow  Active Problems:   COPD exacerbation ([place])    Adaline Sill, NP-C 01/23/2021 12:01 PM

## 2021-01-23 NOTE — Progress Notes (Signed)
PROGRESS NOTE    Colin Novak  KDX:[id number] DOB: Jul 26, 1946 DOA: 01/22/2021 PCP: Patient, No Pcp Per (Inactive)    Brief Narrative:  Colin Novak is a 75 y.o. cachectic Caucasian male with medical history significant for COPD, type diabetes mellitus, GERD, dyslipidemia and anxiety as well as lung cancer, presented to the emergency room with a [place] of worsening dyspnea over the last couple of days with associated cough productive of clear whitish sputum as well as wheezing.  He denies any fever or chills.  No chest pain or palpitations.  No nausea or vomiting or abdominal pain.  No dysuria, oliguria or hematuria or flank pain.  Pulsoxymeter is 90% on room air.    Consultants:   Cardiology   Procedures: Echo  Antimicrobials:       Subjective: Sob improving.not sure if he can lay flat in bed. Daughter at bedside.  Objective: Vitals:   01/22/21 1900 01/22/21 1953 01/22/21 2347 01/23/21 0429  BP: 108/68 105/71 98/64 121/75  Pulse: 100 92 94 (!) 101  Resp:  18 20 17   Temp:  98.7 F (37.1 C) 98.4 F (36.9 C) 98.3 F (36.8 C)  TempSrc:  Oral Oral Oral  SpO2: 94% 98% 98% 98%  Weight:  53.9 kg    Height:  5\' 8"  (1.727 m)      Intake/Output Summary (Last 24 hours) at 01/23/2021 0817 Last data filed at 01/23/2021 0804 Gross per 24 hour  Intake 370 ml  Output 2400 ml  Net -2030 ml   Filed Weights   01/22/21 0055 01/22/21 1953  Weight: 55.6 kg 53.9 kg    Examination:  General exam: Appears calm and comfortable  Respiratory system: crackles at bases Cardiovascular system: S1 & S2 heard, RRR. No JVD, murmurs, rubs, gallops or clicks. +JVD Gastrointestinal system: Abdomen is nondistended, soft and nontender.. Normal bowel sounds heard. Central nervous system: Alert and oriented. Grossly intact Extremities+pitting edema bl Skin: warm, dry Psychiatry: Judgement and insight appear normal. Mood & affect appropriate.     Data Reviewed: I have personally reviewed  following labs and imaging studies  CBC: Recent Labs  Lab 01/18/21 1540 01/22/21 0104 01/22/21 0423  WBC 6.4 5.2 6.5  NEUTROABS  --  3.4  --   HGB 12.3* 11.5* 11.9*  HCT 36.8* 34.7* 34.6*  MCV 101.4* 102.1* 100.0  PLT 132* 236 976   Basic Metabolic Panel: Recent Labs  Lab 01/18/21 1540 01/22/21 0104 01/22/21 0423 01/23/21 0455  NA 141 141 140 139  K 4.0 3.4* 3.5 3.9  CL 104 103 103 106  CO2 27 29 28 26   GLUCOSE 81 183* 285* 133*  BUN 20 12 13 21   CREATININE 0.40* 0.46* 0.46* 0.40*  CALCIUM 8.7* 8.8* 8.7* 8.8*   GFR: Estimated Creatinine Clearance: 60.8 mL/min (A) (by C-G formula based on SCr of 0.4 mg/dL (L)). Liver Function Tests: Recent Labs  Lab 01/18/21 1540  AST 36  ALT 25  ALKPHOS 69  BILITOT 0.8  PROT 6.1*  ALBUMIN 3.3*   No results for input(s): LIPASE, AMYLASE in the last 168 hours. No results for input(s): AMMONIA in the last 168 hours. Coagulation Profile: Recent Labs  Lab 01/22/21 0550  INR 1.0   Cardiac Enzymes: No results for input(s): CKTOTAL, CKMB, CKMBINDEX, TROPONINI in the last 168 hours. BNP (last 3 results) No results for input(s): PROBNP in the last 8760 hours. HbA1C: Recent Labs    01/22/21 0104  HGBA1C 12.6*   CBG: Recent Labs  Lab 01/22/21 0911 01/22/21 1258 01/22/21 1808 01/22/21 2200 01/23/21 0725  GLUCAP 244* 286* 224* 136* 180*   Lipid Profile: No results for input(s): CHOL, HDL, LDLCALC, TRIG, CHOLHDL, LDLDIRECT in the last 72 hours. Thyroid Function Tests: No results for input(s): TSH, T4TOTAL, FREET4, T3FREE, THYROIDAB in the last 72 hours. Anemia Panel: No results for input(s): VITAMINB12, FOLATE, FERRITIN, TIBC, IRON, RETICCTPCT in the last 72 hours. Sepsis Labs: No results for input(s): PROCALCITON, LATICACIDVEN in the last 168 hours.  Recent Results (from the past 240 hour(s))  Resp Panel by RT-PCR (Flu A&B, Covid) Nasopharyngeal Swab     Status: None   Collection Time: 01/22/21  1:05 AM    Specimen: Nasopharyngeal Swab; Nasopharyngeal(NP) swabs in vial transport medium  Result Value Ref Range Status   SARS Coronavirus 2 by RT PCR NEGATIVE NEGATIVE Final    Comment: (NOTE) SARS-CoV-2 target nucleic acids are NOT DETECTED.  The SARS-CoV-2 RNA is generally detectable in upper respiratory specimens during the acute phase of infection. The lowest concentration of SARS-CoV-2 viral copies this assay can detect is 138 copies/mL. A negative result does not preclude SARS-Cov-2 infection and should not be used as the sole basis for treatment or other patient management decisions. A negative result may occur with  improper specimen collection/handling, submission of specimen other than nasopharyngeal swab, presence of viral mutation(s) within the areas targeted by this assay, and inadequate number of viral copies(<138 copies/mL). A negative result must be combined with clinical observations, patient history, and epidemiological information. The expected result is Negative.  Fact Sheet for Patients:  EntrepreneurPulse.com.au  Fact Sheet for Healthcare Providers:  IncredibleEmployment.be  This test is no t yet approved or cleared by the [place] FDA and  has been authorized for detection and/or diagnosis of SARS-CoV-2 by FDA under an Emergency Use Authorization (EUA). This EUA will remain  in effect (meaning this test can be used) for the duration of the COVID-19 declaration under Section 564(b)(1) of the Act, 21 U.S.C.section 360bbb-3(b)(1), unless the authorization is terminated  or revoked sooner.       Influenza A by PCR NEGATIVE NEGATIVE Final   Influenza B by PCR NEGATIVE NEGATIVE Final    Comment: (NOTE) The Xpert Xpress SARS-CoV-2/FLU/RSV plus assay is intended as an aid in the diagnosis of influenza from Nasopharyngeal swab specimens and should not be used as a sole basis for treatment. Nasal washings and aspirates are  unacceptable for Xpert Xpress SARS-CoV-2/FLU/RSV testing.  Fact Sheet for Patients: EntrepreneurPulse.com.au  Fact Sheet for Healthcare Providers: IncredibleEmployment.be  This test is not yet approved or cleared by the [place] FDA and has been authorized for detection and/or diagnosis of SARS-CoV-2 by FDA under an Emergency Use Authorization (EUA). This EUA will remain in effect (meaning this test can be used) for the duration of the COVID-19 declaration under Section 564(b)(1) of the Act, 21 U.S.C. section 360bbb-3(b)(1), unless the authorization is terminated or revoked.  Performed at [facility], [address] [place], [place] [address]   Culture, blood (Routine X 2) w Reflex to ID Panel     Status: None (Preliminary result)   Collection Time: 01/22/21  4:23 AM   Specimen: BLOOD  Result Value Ref Range Status   Specimen Description BLOOD LEFT [facility]  Final   Special Requests   Final    BOTTLES DRAWN AEROBIC AND ANAEROBIC Blood Culture adequate volume   Culture  Setup Time   Final    GRAM POSITIVE COCCI ANAEROBIC BOTTLE  ONLY Organism ID to follow CRITICAL RESULT CALLED TO, READ BACK BY AND VERIFIED WITHVioleta Gelinas PHARMD [id number] 01/23/21 HNM Performed at [facility], [address] [place], [place] [address]    Culture [facility] POSITIVE COCCI  Final   Report Status PENDING  Incomplete  Blood Culture ID Panel (Reflexed)     Status: Abnormal   Collection Time: 01/22/21  4:23 AM  Result Value Ref Range Status   Enterococcus faecalis NOT DETECTED NOT DETECTED Final   Enterococcus Faecium NOT DETECTED NOT DETECTED Final   Listeria monocytogenes NOT DETECTED NOT DETECTED Final   Staphylococcus species DETECTED (A) NOT DETECTED Final    Comment: CRITICAL RESULT CALLED TO, READ BACK BY AND VERIFIED WITH: JASON ROBBINS PHARMD 0226 01/23/21 HNM    Staphylococcus aureus (BCID) NOT DETECTED NOT DETECTED Final    Staphylococcus epidermidis DETECTED (A) NOT DETECTED Final    Comment: CRITICAL RESULT CALLED TO, READ BACK BY AND VERIFIED WITH: JASON ROBBINS PHARMD 0226 01/23/21 HNM    Staphylococcus lugdunensis NOT DETECTED NOT DETECTED Final   Streptococcus species NOT DETECTED NOT DETECTED Final   Streptococcus agalactiae NOT DETECTED NOT DETECTED Final   Streptococcus pneumoniae NOT DETECTED NOT DETECTED Final   Streptococcus pyogenes NOT DETECTED NOT DETECTED Final   A.calcoaceticus-baumannii NOT DETECTED NOT DETECTED Final   Bacteroides fragilis NOT DETECTED NOT DETECTED Final   Enterobacterales NOT DETECTED NOT DETECTED Final   Enterobacter cloacae complex NOT DETECTED NOT DETECTED Final   Escherichia coli NOT DETECTED NOT DETECTED Final   Klebsiella aerogenes NOT DETECTED NOT DETECTED Final   Klebsiella oxytoca NOT DETECTED NOT DETECTED Final   Klebsiella pneumoniae NOT DETECTED NOT DETECTED Final   Proteus species NOT DETECTED NOT DETECTED Final   Salmonella species NOT DETECTED NOT DETECTED Final   Serratia marcescens NOT DETECTED NOT DETECTED Final   Haemophilus influenzae NOT DETECTED NOT DETECTED Final   Neisseria meningitidis NOT DETECTED NOT DETECTED Final   Pseudomonas aeruginosa NOT DETECTED NOT DETECTED Final   Stenotrophomonas maltophilia NOT DETECTED NOT DETECTED Final   Candida albicans NOT DETECTED NOT DETECTED Final   Candida auris NOT DETECTED NOT DETECTED Final   Candida glabrata NOT DETECTED NOT DETECTED Final   Candida krusei NOT DETECTED NOT DETECTED Final   Candida parapsilosis NOT DETECTED NOT DETECTED Final   Candida tropicalis NOT DETECTED NOT DETECTED Final   Cryptococcus neoformans/gattii NOT DETECTED NOT DETECTED Final   Methicillin resistance mecA/C NOT DETECTED NOT DETECTED Final    Comment: Performed at [facility], [address] [place], [place] [address]  Culture, blood (Routine X 2) w Reflex to ID Panel     Status: None (Preliminary result)    Collection Time: 01/22/21  4:24 AM   Specimen: BLOOD  Result Value Ref Range Status   Specimen Description BLOOD RIGHT FA  Final   Special Requests   Final    BOTTLES DRAWN AEROBIC AND ANAEROBIC Blood Culture adequate volume   Culture   Final    NO GROWTH < 24 HOURS Performed at [facility], [address] [place],  [address]    Report Status PENDING  Incomplete         Radiology Studies: DG Chest Portable 1 View  Result Date: 01/22/2021 CLINICAL DATA:  Shortness of breath EXAM: PORTABLE CHEST 1 VIEW COMPARISON:  09/27/2020 FINDINGS: Cardiac shadow is within normal limits. Aortic calcifications are again seen. Patient rotation to the right accentuates the mediastinal markings but stable. This is similar  to fibrotic changes seen on prior CT. Persistent density is noted in the medial aspect of the left apex similar to that seen on prior CT exam. Lungs are hyperinflated bilaterally. No bony abnormality noted. IMPRESSION: Stable changes in the medial paramediastinal region and left apex medially consistent with the given clinical history lung carcinoma. Stable changes of COPD. Electronically Signed   By: Inez Catalina M.D.   On: 01/22/2021 01:28        Scheduled Meds: . arformoterol  15 mcg Nebulization BID  . aspirin EC  81 mg Oral Daily  . atorvastatin  80 mg Oral Daily  . budesonide (PULMICORT) nebulizer solution  0.25 mg Nebulization BID  . citalopram  20 mg Oral Daily  . famotidine  20 mg Oral Daily  . fludrocortisone  100 mcg Oral Daily  . furosemide  40 mg Intravenous Q12H  . guaiFENesin  600 mg Oral BID  . insulin aspart  0-9 Units Subcutaneous TID PC & HS  . insulin glargine  14 Units Subcutaneous Daily  . ipratropium-albuterol  3 mL Nebulization QID  . megestrol  400 mg Oral Daily  . methylPREDNISolone (SOLU-MEDROL) injection  40 mg Intravenous Q8H   Followed by  . [START ON 01/24/2021] predniSONE  40 mg Oral Q breakfast  . multivitamin with minerals   1 tablet Oral Daily   Continuous Infusions: . azithromycin 500 mg (01/23/21 0436)    Assessment & Plan:   Active Problems:   COPD exacerbation ([place])   1.  COPD acute exacerbation.  Sx improved. Will dc iv steroid as there is no wheezing now and on RA. Continue abx Continue mucinex   2.  acute CHF Still volume overloaded bnp elevated Clinically reports improving Echo pending, will f/u Continue iv lasix  His sx at this point likely due to chf decompensation   3.  Elevated troponin -likely due to demand ischemia , will obtain trend Echo pending F/u on cardiology    4.  Type II diabetes mellitus. BG controlled Continue RISS   5.  Depression.  continue Celexa.  6.  Non-small cell lung cancer. -Pain management will be provided.   DVT prophylaxis: Lovenox Code Status: DNR Family Communication: Daughter at bedside  Status is: Inpatient  Remains inpatient appropriate because:Inpatient level of care appropriate due to severity of illness   Dispo: The patient is from: Home              Anticipated d/c is to: Home              Patient currently is not medically stable to d/c.   Difficult to place patient No              LOS: 1 day   Time spent: 35 min with >50% on coc    Nolberto Hanlon, MD [facility] Pager [phone]  If 7PM-7AM, please contact night-coverage 01/23/2021, 8:17 AM

## 2021-01-23 NOTE — Progress Notes (Signed)
Mobility Specialist - Progress Note   01/23/21 1600  Mobility  Activity Ambulated in hall  Level of Assistance Minimal assist, patient does 75% or more  Assistive Device Front wheel walker  Distance Ambulated (ft) 180 ft  Mobility Response Tolerated well  Mobility performed by Mobility specialist  $Mobility charge 1 Mobility    Pre-mobility: 104 HR, 93% SpO2 During mobility: 108 HR, 88% SpO2 Post-mobility: 96 HR, 94% SpO2   Pt ambulated in hallway with RW. Uses Rolator at baseline. Pt impulsive, requiring verbal cues and minA throughout session. Pt c/o mild SOB during ambulation, O2 desat to 88% on RA. Pt stated it is difficult for him to breathe through his mouth. Upon return to room, pt attempted to take off running towards bed and was educated of safety precautions. No LOB noted. Family at bedside.    Kathee Delton Mobility Specialist 01/23/21, 4:32 PM

## 2021-01-24 DIAGNOSIS — E876 Hypokalemia: Secondary | ICD-10-CM

## 2021-01-24 LAB — BASIC METABOLIC PANEL
Anion gap: 13 (ref 5–15)
BUN: 24 mg/dL — ABNORMAL HIGH (ref 8–23)
CO2: 30 mmol/L (ref 22–32)
Calcium: 9.4 mg/dL (ref 8.9–10.3)
Chloride: 99 mmol/L (ref 98–111)
Creatinine, Ser: 0.42 mg/dL — ABNORMAL LOW (ref 0.61–1.24)
GFR, Estimated: >60 mL/min (ref >60)
Glucose, Bld: 40 mg/dL — CL (ref 70–99)
Potassium: 2.7 mmol/L — CL (ref 3.5–5.1)
Sodium: 142 mmol/L (ref 135–145)

## 2021-01-24 LAB — GLUCOSE, RANDOM: Glucose, Bld: 451 mg/dL — ABNORMAL HIGH (ref 70–99)

## 2021-01-24 LAB — MAGNESIUM: Magnesium: 2 mg/dL (ref 1.7–2.4)

## 2021-01-24 LAB — GLUCOSE, CAPILLARY
Glucose-Capillary: 122 mg/dL — ABNORMAL HIGH (ref 70–99)
Glucose-Capillary: >600 mg/dL (ref 70–99)
Glucose-Capillary: 461 mg/dL — ABNORMAL HIGH (ref 70–99)
Glucose-Capillary: 194 mg/dL — ABNORMAL HIGH (ref 70–99)
Glucose-Capillary: 113 mg/dL — ABNORMAL HIGH (ref 70–99)

## 2021-01-24 LAB — BRAIN NATRIURETIC PEPTIDE: B Natriuretic Peptide: 645.5 pg/mL — ABNORMAL HIGH (ref 0.0–100.0)

## 2021-01-24 MED ORDER — POTASSIUM CHLORIDE 10 MEQ/100ML IV SOLN
10.0000 meq | INTRAVENOUS | Status: AC
  Administered 2021-01-24 (×6): 10 meq via INTRAVENOUS
  Filled 2021-01-24 (×6): qty 100

## 2021-01-24 MED ORDER — INSULIN ASPART 100 UNIT/ML ~~LOC~~ SOLN
0.0000 [IU] | SUBCUTANEOUS | Status: DC
  Administered 2021-01-24 – 2021-01-25 (×2): 2 [IU] via SUBCUTANEOUS
  Administered 2021-01-25 (×2): 5 [IU] via SUBCUTANEOUS
  Filled 2021-01-24 (×4): qty 1

## 2021-01-24 MED ORDER — INSULIN GLARGINE 100 UNIT/ML ~~LOC~~ SOLN: 8.0000 [IU] | Freq: Every day | SUBCUTANEOUS | Status: DC

## 2021-01-24 MED ORDER — INSULIN ASPART 100 UNIT/ML ~~LOC~~ SOLN
6.0000 [IU] | Freq: Once | SUBCUTANEOUS | Status: AC
  Administered 2021-01-24: 6 [IU] via SUBCUTANEOUS

## 2021-01-24 MED ORDER — INSULIN ASPART 100 UNIT/ML ~~LOC~~ SOLN
10.0000 [IU] | Freq: Once | SUBCUTANEOUS | Status: AC
  Administered 2021-01-24: 10 [IU] via SUBCUTANEOUS
  Filled 2021-01-24: qty 1

## 2021-01-24 MED ORDER — INSULIN GLARGINE 100 UNIT/ML ~~LOC~~ SOLN
12.0000 [IU] | Freq: Every day | SUBCUTANEOUS | Status: DC
  Administered 2021-01-25 – 2021-01-27 (×3): 12 [IU] via SUBCUTANEOUS
  Filled 2021-01-24 (×3): qty 0.12

## 2021-01-24 MED ORDER — POTASSIUM CHLORIDE CRYS ER 20 MEQ PO TBCR
40.0000 meq | EXTENDED_RELEASE_TABLET | Freq: Two times a day (BID) | ORAL | Status: DC
  Administered 2021-01-24 (×2): 40 meq via ORAL
  Filled 2021-01-24 (×2): qty 2

## 2021-01-24 MED ORDER — INSULIN ASPART 100 UNIT/ML ~~LOC~~ SOLN
3.0000 [IU] | Freq: Three times a day (TID) | SUBCUTANEOUS | Status: DC
  Administered 2021-01-24 – 2021-01-27 (×9): 3 [IU] via SUBCUTANEOUS
  Filled 2021-01-24 (×9): qty 1

## 2021-01-24 MED ORDER — INSULIN ASPART 100 UNIT/ML ~~LOC~~ SOLN
5.0000 [IU] | Freq: Once | SUBCUTANEOUS | Status: AC
  Administered 2021-01-24: 5 [IU] via SUBCUTANEOUS
  Filled 2021-01-24: qty 1

## 2021-01-24 NOTE — Progress Notes (Signed)
Date and time results received: 01/24/21 @0730  (use smartphrase ".now" to insert current time)  Test: Glucose Critical Value: 40  Name of Provider Notified: Dr. Gwynneth Albright  Orders Received? Or Actions Taken?: Orange juice and apple juice given to pt  Pt alert and oriented X4, asymptomatic, staff will recheck the sugar in 3mins

## 2021-01-24 NOTE — Progress Notes (Addendum)
PROGRESS NOTE    Colin Novak  [name]:[id number] DOB: 08/17/1946 DOA: 01/22/2021 PCP: Patient, No Pcp Per (Inactive)    Brief Narrative:  Colin Novak is a 75 y.o. cachectic Caucasian male with medical history significant for COPD, type diabetes mellitus, GERD, dyslipidemia and anxiety as well as lung cancer, presented to the emergency room with a [place] of worsening dyspnea over the last couple of days with associated cough productive of clear whitish sputum as well as wheezing.  He denies any fever or chills.  No chest pain or palpitations.  No nausea or vomiting or abdominal pain.  No dysuria, oliguria or hematuria or flank pain.  Pulsoxymeter is 90% on room air.  3/31- feels better. Sugar was low 40 this am, now up since he was drinking regular soda  Consultants:   Cardiology   Procedures: Echo  Antimicrobials:       Subjective: Sob, better, no cp.   Objective: Vitals:   01/23/21 1535 01/23/21 1632 01/23/21 1947 01/24/21 0339  BP: 108/70 100/60 (!) 96/58 116/74  Pulse: 87 93 87 (!) 102  Resp: 20 20 16 17   Temp: 97.7 F (36.5 C) 97.9 F (36.6 C) 97.7 F (36.5 C) 98.2 F (36.8 C)  TempSrc: Oral Oral Oral Oral  SpO2: 97% 100% 98% 92%  Weight:    49 kg  Height:        Intake/Output Summary (Last 24 hours) at 01/24/2021 0808 Last data filed at 01/24/2021 0726 Gross per 24 hour  Intake 1197 ml  Output 3250 ml  Net -2053 ml   Filed Weights   01/22/21 0055 01/22/21 1953 01/24/21 0339  Weight: 55.6 kg 53.9 kg 49 kg    Examination:  Calm, comfortable CTA with decreased breath sounds, no wheezing Regular S1-S2 no gallops +jvd Soft benign positive bowel sounds No edema Awake alert and oriented, grossly intact   Data Reviewed: I have personally reviewed following labs and imaging studies  CBC: Recent Labs  Lab 01/18/21 1540 01/22/21 0104 01/22/21 0423 01/23/21 0455  WBC 6.4 5.2 6.5 6.5  NEUTROABS  --  3.4  --   --   HGB 12.3* 11.5* 11.9* 11.2*   HCT 36.8* 34.7* 34.6* 34.3*  MCV 101.4* 102.1* 100.0 103.6*  PLT 132* 236 225 308   Basic Metabolic Panel: Recent Labs  Lab 01/18/21 1540 01/22/21 0104 01/22/21 0423 01/23/21 0455 01/23/21 2111 01/24/21 0557  NA 141 141 140 139  --  142  K 4.0 3.4* 3.5 3.9  --  2.7*  CL 104 103 103 106  --  99  CO2 27 29 28 26   --  30  GLUCOSE 81 183* 285* 133* 466* 40*  BUN 20 12 13 21   --  24*  CREATININE 0.40* 0.46* 0.46* 0.40*  --  0.42*  CALCIUM 8.7* 8.8* 8.7* 8.8*  --  9.4   GFR: Estimated Creatinine Clearance: 55.3 mL/min (A) (by C-G formula based on SCr of 0.42 mg/dL (L)). Liver Function Tests: Recent Labs  Lab 01/18/21 1540  AST 36  ALT 25  ALKPHOS 69  BILITOT 0.8  PROT 6.1*  ALBUMIN 3.3*   No results for input(s): LIPASE, AMYLASE in the last 168 hours. No results for input(s): AMMONIA in the last 168 hours. Coagulation Profile: Recent Labs  Lab 01/22/21 0550  INR 1.0   Cardiac Enzymes: No results for input(s): CKTOTAL, CKMB, CKMBINDEX, TROPONINI in the last 168 hours. BNP (last 3 results) No results for input(s): PROBNP in the last  8760 hours. HbA1C: Recent Labs    01/22/21 0104  HGBA1C 12.6*   CBG: Recent Labs  Lab 01/23/21 1119 01/23/21 1619 01/23/21 2037 01/23/21 2331 01/24/21 0750  GLUCAP 153* 360* 450* 390* 122*   Lipid Profile: No results for input(s): CHOL, HDL, LDLCALC, TRIG, CHOLHDL, LDLDIRECT in the last 72 hours. Thyroid Function Tests: No results for input(s): TSH, T4TOTAL, FREET4, T3FREE, THYROIDAB in the last 72 hours. Anemia Panel: No results for input(s): VITAMINB12, FOLATE, FERRITIN, TIBC, IRON, RETICCTPCT in the last 72 hours. Sepsis Labs: No results for input(s): PROCALCITON, LATICACIDVEN in the last 168 hours.  Recent Results (from the past 240 hour(s))  Resp Panel by RT-PCR (Flu A&B, Covid) Nasopharyngeal Swab     Status: None   Collection Time: 01/22/21  1:05 AM   Specimen: Nasopharyngeal Swab; Nasopharyngeal(NP) swabs in  vial transport medium  Result Value Ref Range Status   SARS Coronavirus 2 by RT PCR NEGATIVE NEGATIVE Final    Comment: (NOTE) SARS-CoV-2 target nucleic acids are NOT DETECTED.  The SARS-CoV-2 RNA is generally detectable in upper respiratory specimens during the acute phase of infection. The lowest concentration of SARS-CoV-2 viral copies this assay can detect is 138 copies/mL. A negative result does not preclude SARS-Cov-2 infection and should not be used as the sole basis for treatment or other patient management decisions. A negative result may occur with  improper specimen collection/handling, submission of specimen other than nasopharyngeal swab, presence of viral mutation(s) within the areas targeted by this assay, and inadequate number of viral copies(<138 copies/mL). A negative result must be combined with clinical observations, patient history, and epidemiological information. The expected result is Negative.  Fact Sheet for Patients:  EntrepreneurPulse.com.au  Fact Sheet for Healthcare Providers:  IncredibleEmployment.be  This test is no t yet approved or cleared by the [place] FDA and  has been authorized for detection and/or diagnosis of SARS-CoV-2 by FDA under an Emergency Use Authorization (EUA). This EUA will remain  in effect (meaning this test can be used) for the duration of the COVID-19 declaration under Section 564(b)(1) of the Act, 21 U.S.C.section 360bbb-3(b)(1), unless the authorization is terminated  or revoked sooner.       Influenza A by PCR NEGATIVE NEGATIVE Final   Influenza B by PCR NEGATIVE NEGATIVE Final    Comment: (NOTE) The Xpert Xpress SARS-CoV-2/FLU/RSV plus assay is intended as an aid in the diagnosis of influenza from Nasopharyngeal swab specimens and should not be used as a sole basis for treatment. Nasal washings and aspirates are unacceptable for Xpert Xpress SARS-CoV-2/FLU/RSV testing.  Fact  Sheet for Patients: EntrepreneurPulse.com.au  Fact Sheet for Healthcare Providers: IncredibleEmployment.be  This test is not yet approved or cleared by the [place] FDA and has been authorized for detection and/or diagnosis of SARS-CoV-2 by FDA under an Emergency Use Authorization (EUA). This EUA will remain in effect (meaning this test can be used) for the duration of the COVID-19 declaration under Section 564(b)(1) of the Act, 21 U.S.C. section 360bbb-3(b)(1), unless the authorization is terminated or revoked.  Performed at [facility], [address] [place], [place] [address]   Culture, blood (Routine X 2) w Reflex to ID Panel     Status: Abnormal (Preliminary result)   Collection Time: 01/22/21  4:23 AM   Specimen: BLOOD  Result Value Ref Range Status   Specimen Description   Final    BLOOD LEFT [facility] Performed at [facility], [address], [place], [place] [address]  Special Requests   Final    BOTTLES DRAWN AEROBIC AND ANAEROBIC Blood Culture adequate volume Performed at [facility], [address] [place], [place] [address]    Culture  Setup Time   Final    GRAM POSITIVE COCCI ANAEROBIC BOTTLE ONLY Organism ID to follow CRITICAL RESULT CALLED TO, READ BACK BY AND VERIFIED WITHVioleta Gelinas [facility] [id number] 01/23/21 HNM Performed at [facility], [address] [place], [place] [address]    Culture (A)  Final    STAPHYLOCOCCUS EPIDERMIDIS THE SIGNIFICANCE OF ISOLATING THIS ORGANISM FROM A SINGLE SET OF BLOOD CULTURES WHEN MULTIPLE SETS ARE DRAWN IS UNCERTAIN. PLEASE NOTIFY THE MICROBIOLOGY DEPARTMENT WITHIN ONE WEEK IF SPECIATION AND SENSITIVITIES ARE REQUIRED. Performed at [facility], [place] [address] [place], [place] [address]    Report Status PENDING  Incomplete  Blood Culture ID Panel (Reflexed)     Status: Abnormal   Collection Time: 01/22/21  4:23 AM  Result Value Ref  Range Status   Enterococcus faecalis NOT DETECTED NOT DETECTED Final   Enterococcus Faecium NOT DETECTED NOT DETECTED Final   Listeria monocytogenes NOT DETECTED NOT DETECTED Final   Staphylococcus species DETECTED (A) NOT DETECTED Final    Comment: CRITICAL RESULT CALLED TO, READ BACK BY AND VERIFIED WITH: JASON ROBBINS PHARMD 0226 01/23/21 HNM    Staphylococcus aureus (BCID) NOT DETECTED NOT DETECTED Final   Staphylococcus epidermidis DETECTED (A) NOT DETECTED Final    Comment: CRITICAL RESULT CALLED TO, READ BACK BY AND VERIFIED WITH: JASON ROBBINS PHARMD 0226 01/23/21 HNM    Staphylococcus lugdunensis NOT DETECTED NOT DETECTED Final   Streptococcus species NOT DETECTED NOT DETECTED Final   Streptococcus agalactiae NOT DETECTED NOT DETECTED Final   Streptococcus pneumoniae NOT DETECTED NOT DETECTED Final   Streptococcus pyogenes NOT DETECTED NOT DETECTED Final   A.calcoaceticus-baumannii NOT DETECTED NOT DETECTED Final   Bacteroides fragilis NOT DETECTED NOT DETECTED Final   Enterobacterales NOT DETECTED NOT DETECTED Final   Enterobacter cloacae complex NOT DETECTED NOT DETECTED Final   Escherichia coli NOT DETECTED NOT DETECTED Final   Klebsiella aerogenes NOT DETECTED NOT DETECTED Final   Klebsiella oxytoca NOT DETECTED NOT DETECTED Final   Klebsiella pneumoniae NOT DETECTED NOT DETECTED Final   Proteus species NOT DETECTED NOT DETECTED Final   Salmonella species NOT DETECTED NOT DETECTED Final   Serratia marcescens NOT DETECTED NOT DETECTED Final   Haemophilus influenzae NOT DETECTED NOT DETECTED Final   Neisseria meningitidis NOT DETECTED NOT DETECTED Final   Pseudomonas aeruginosa NOT DETECTED NOT DETECTED Final   Stenotrophomonas maltophilia NOT DETECTED NOT DETECTED Final   Candida albicans NOT DETECTED NOT DETECTED Final   Candida auris NOT DETECTED NOT DETECTED Final   Candida glabrata NOT DETECTED NOT DETECTED Final   Candida krusei NOT DETECTED NOT DETECTED Final    Candida parapsilosis NOT DETECTED NOT DETECTED Final   Candida tropicalis NOT DETECTED NOT DETECTED Final   Cryptococcus neoformans/gattii NOT DETECTED NOT DETECTED Final   Methicillin resistance mecA/C NOT DETECTED NOT DETECTED Final    Comment: Performed at [facility], [address] [place], [place] [address]  Culture, blood (Routine X 2) w Reflex to ID Panel     Status: None (Preliminary result)   Collection Time: 01/22/21  4:24 AM   Specimen: BLOOD  Result Value Ref Range Status   Specimen Description BLOOD RIGHT FA  Final   Special Requests   Final    BOTTLES DRAWN AEROBIC AND  ANAEROBIC Blood Culture adequate volume   Culture   Final    NO GROWTH 2 DAYS Performed at [facility], [address] [place], [place] [address]    Report Status PENDING  Incomplete         Radiology Studies: ECHOCARDIOGRAM COMPLETE  Result Date: 01/23/2021    ECHOCARDIOGRAM REPORT   Patient Name:   Colin Novak Date of Exam: 01/23/2021 Medical Rec #:  [id number]      Height:       68.0 in Accession #:    [id number]     Weight:       118.9 lb Date of Birth:  1946-03-23      BSA:          1.638 m Patient Age:    76 years       BP:           91/56 mmHg Patient Gender: M              HR:           83 bpm. Exam Location:  [facility] Procedure: 2D Echo, Color Doppler and Cardiac Doppler Indications:     I50.31 CHF-Acute Diastolic  History:         Patient has no prior history of Echocardiogram examinations.                  COPD; Risk Factors:Current Smoker, Dyslipidemia and Diabetes.  Sonographer:     Charmayne Sheer RDCS (AE) Referring Phys:  [id number] [place] Diagnosing Phys: Neoma Laming MD  Sonographer Comments: Suboptimal apical window. Image acquisition challenging due to COPD and Image acquisition challenging due to patient body habitus. IMPRESSIONS  1. Left ventricular ejection fraction, by estimation, is 50 to 55%. The left ventricle has low normal function. The left ventricle demonstrates  global hypokinesis. There is moderate concentric left ventricular hypertrophy. Left ventricular diastolic parameters are consistent with Grade II diastolic dysfunction (pseudonormalization).  2. Right ventricular systolic function is normal. The right ventricular size is normal.  3. Left atrial size was moderately dilated.  4. Right atrial size was moderately dilated.  5. The mitral valve is normal in structure. Mild mitral valve regurgitation. No evidence of mitral stenosis.  6. Tricuspid valve regurgitation is mild to moderate.  7. The aortic valve is normal in structure. Aortic valve regurgitation is mild. Mild aortic valve sclerosis is present, with no evidence of aortic valve stenosis.  8. The inferior vena cava is normal in size with greater than 50% respiratory variability, suggesting right atrial pressure of 3 mmHg. FINDINGS  Left Ventricle: Left ventricular ejection fraction, by estimation, is 50 to 55%. The left ventricle has low normal function. The left ventricle demonstrates global hypokinesis. The left ventricular internal cavity size was normal in size. There is moderate concentric left ventricular hypertrophy. Left ventricular diastolic parameters are consistent with Grade II diastolic dysfunction (pseudonormalization). Right Ventricle: The right ventricular size is normal. No increase in right ventricular wall thickness. Right ventricular systolic function is normal. Left Atrium: Left atrial size was moderately dilated. Right Atrium: Right atrial size was moderately dilated. Pericardium: There is no evidence of pericardial effusion. Mitral Valve: The mitral valve is normal in structure. Mild mitral valve regurgitation. No evidence of mitral valve stenosis. MV peak gradient, 1.7 mmHg. The mean mitral valve gradient is 1.0 mmHg. Tricuspid Valve: The tricuspid valve is normal in structure. Tricuspid valve regurgitation is mild to moderate. No evidence of tricuspid stenosis. Aortic  Valve: The aortic  valve is normal in structure. Aortic valve regurgitation is mild. Mild aortic valve sclerosis is present, with no evidence of aortic valve stenosis. Aortic valve mean gradient measures 2.0 mmHg. Aortic valve peak gradient measures 3.2 mmHg. Aortic valve area, by VTI measures 2.54 cm. Pulmonic Valve: The pulmonic valve was normal in structure. Pulmonic valve regurgitation is not visualized. No evidence of pulmonic stenosis. Aorta: The aortic root is normal in size and structure. Venous: The inferior vena cava is normal in size with greater than 50% respiratory variability, suggesting right atrial pressure of 3 mmHg. IAS/Shunts: No atrial level shunt detected by color flow Doppler.  LEFT VENTRICLE PLAX 2D LVIDd:         3.00 cm  Diastology LVIDs:         2.20 cm  LV e' medial:    6.31 cm/s LV PW:         1.00 cm  LV E/e' medial:  9.8 LV IVS:        0.70 cm  LV e' lateral:   7.83 cm/s LVOT diam:     2.20 cm  LV E/e' lateral: 7.9 LV SV:         38 LV SV Index:   23 LVOT Area:     3.80 cm  RIGHT VENTRICLE RV Basal diam:  3.80 cm TAPSE (M-mode): 2.5 cm LEFT ATRIUM           Index LA diam:      2.80 cm 1.71 cm/m LA Vol (A4C): 62.9 ml 38.39 ml/m  AORTIC VALVE                   PULMONIC VALVE AV Area (Vmax):    2.73 cm    PV Vmax:       0.69 m/s AV Area (Vmean):   2.81 cm    PV Vmean:      47.800 cm/s AV Area (VTI):     2.54 cm    PV VTI:        0.131 m AV Vmax:           90.00 cm/s  PV Peak grad:  1.9 mmHg AV Vmean:          56.700 cm/s PV Mean grad:  1.0 mmHg AV VTI:            0.151 m AV Peak Grad:      3.2 mmHg AV Mean Grad:      2.0 mmHg LVOT Vmax:         64.60 cm/s LVOT Vmean:        41.900 cm/s LVOT VTI:          0.101 m LVOT/AV VTI ratio: 0.67  AORTA Ao Root diam: 4.90 cm MITRAL VALVE               TRICUSPID VALVE MV Area (PHT): 5.38 cm    TR Peak grad:   33.4 mmHg MV Area VTI:   2.91 cm    TR Vmax:        289.00 cm/s MV Peak grad:  1.7 mmHg MV Mean grad:  1.0 mmHg    SHUNTS MV Vmax:       0.65 m/s     Systemic VTI:  0.10 m MV Vmean:      45.0 cm/s   Systemic Diam: 2.20 cm MV Decel Time: 141 msec MV E velocity: 62.10 cm/s MV A velocity: 46.30 cm/s MV E/A ratio:  1.34  Neoma Laming MD Electronically signed by Neoma Laming MD Signature Date/Time: 01/23/2021/4:10:12 PM    Final         Scheduled Meds: . arformoterol  15 mcg Nebulization BID  . aspirin EC  81 mg Oral Daily  . atorvastatin  80 mg Oral Daily  . azithromycin  500 mg Oral Daily  . budesonide (PULMICORT) nebulizer solution  0.25 mg Nebulization BID  . citalopram  20 mg Oral Daily  . enoxaparin (LOVENOX) injection  40 mg Subcutaneous Q24H  . famotidine  20 mg Oral Daily  . feeding supplement  237 mL Oral TID BM  . fludrocortisone  100 mcg Oral Daily  . furosemide  40 mg Intravenous Q12H  . guaiFENesin  600 mg Oral BID  . insulin aspart  0-9 Units Subcutaneous TID PC & HS  . [START ON 01/25/2021] insulin glargine  8 Units Subcutaneous Daily  . ipratropium-albuterol  3 mL Nebulization TID  . megestrol  400 mg Oral Daily  . multivitamin with minerals  1 tablet Oral Daily   Continuous Infusions:   Assessment & Plan:   Active Problems:   COPD exacerbation ([place])   1.  COPD acute exacerbation.  Sx improved. 3/31-iv there were DC'd yesterday  Continue antibiotics  Continue Mucinex     2.  acute diastolic heart failure  Clinically improving BNP elevated EF 50 to 55%.  Grade 2 diastolic dysfunction. Continue IV Lasix I's and O's Daily weight     3.  Elevated troponin -likely due to demand ischemia , will obtain trend EF 50 to 55%.  Global hypokinesis with moderate concentric LVH. Cardiology following  4.  Hypokalemia-K2.7 Will replete with potassium IV and p.o. Check a.m. levels  5.  Type II diabetes mellitus. Blood glucose levels have been uncontrolled Had regular soda Told nursing to only give sugar-free products Increase Lantus to 12 and add NovoLog 3 units 3 times daily with meals R-ISS  6.   Depression. Continue celexa  7.  Non-small cell lung cancer. -Pain management will be provided.   DVT prophylaxis: Lovenox Code Status: DNR Family Communication: Daughter updated via phone  Status is: Inpatient  Remains inpatient appropriate because:Inpatient level of care appropriate due to severity of illness   Dispo: The patient is from: Home              Anticipated d/c is to: Home              Patient currently is not medically stable to d/c.   Difficult to place patient No   Possibly DC in a.m.           LOS: 2 days   Time spent: 45 min with >50% on coc    Nolberto Hanlon, MD [facility] Pager [phone]  If 7PM-7AM, please contact night-coverage 01/24/2021, 8:08 AM

## 2021-01-24 NOTE — Progress Notes (Signed)
Mobility Specialist - Progress Note    01/24/21 1600  Mobility  Activity Contraindicated/medical hold  Mobility performed by Mobility specialist   Per chart review, pt not appropriate for mobility this date d/t low K+ of 2.7 and elevated glucose (currently 451). Will attempt session another date when medically appropriate.    Kathee Delton Mobility Specialist 01/24/21, 4:39 PM

## 2021-01-24 NOTE — Progress Notes (Signed)
SUBJECTIVE: Patient resting comfortably. States his dyspnea is at baseline and denies chest pain.   Vitals:   01/23/21 1632 01/23/21 1947 01/24/21 0339 01/24/21 0820  BP: 100/60 (!) 96/58 116/74 101/70  Pulse: 93 87 (!) 102 96  Resp: 20 16 17 17   Temp: 97.9 F (36.6 C) 97.7 F (36.5 C) 98.2 F (36.8 C) 98.1 F (36.7 C)  TempSrc: Oral Oral Oral   SpO2: 100% 98% 92% 99%  Weight:   49 kg   Height:        Intake/Output Summary (Last 24 hours) at 01/24/2021 1032 Last data filed at 01/24/2021 1000 Gross per 24 hour  Intake 1197 ml  Output 3250 ml  Net -2053 ml    LABS: Basic Metabolic Panel: Recent Labs    01/23/21 0455 01/23/21 2111 01/24/21 0557  NA 139  --  142  K 3.9  --  2.7*  CL 106  --  99  CO2 26  --  30  GLUCOSE 133* 466* 40*  BUN 21  --  24*  CREATININE 0.40*  --  0.42*  CALCIUM 8.8*  --  9.4  MG  --   --  2.0   Liver Function Tests: No results for input(s): AST, ALT, ALKPHOS, BILITOT, PROT, ALBUMIN in the last 72 hours. No results for input(s): LIPASE, AMYLASE in the last 72 hours. CBC: Recent Labs    01/22/21 0104 01/22/21 0423 01/23/21 0455  WBC 5.2 6.5 6.5  NEUTROABS 3.4  --   --   HGB 11.5* 11.9* 11.2*  HCT 34.7* 34.6* 34.3*  MCV 102.1* 100.0 103.6*  PLT 236 225 239   Cardiac Enzymes: No results for input(s): CKTOTAL, CKMB, CKMBINDEX, TROPONINI in the last 72 hours. BNP: Invalid input(s): POCBNP D-Dimer: No results for input(s): DDIMER in the last 72 hours. Hemoglobin A1C: Recent Labs    01/22/21 0104  HGBA1C 12.6*   Fasting Lipid Panel: No results for input(s): CHOL, HDL, LDLCALC, TRIG, CHOLHDL, LDLDIRECT in the last 72 hours. Thyroid Function Tests: No results for input(s): TSH, T4TOTAL, T3FREE, THYROIDAB in the last 72 hours.  Invalid input(s): FREET3 Anemia Panel: No results for input(s): VITAMINB12, FOLATE, FERRITIN, TIBC, IRON, RETICCTPCT in the last 72 hours.   PHYSICAL EXAM General: Well developed, well nourished, in no  acute distress HEENT:  Normocephalic and atramatic Neck:  No JVD.  Lungs: Clear bilaterally to auscultation and percussion. Heart: HRRR . Normal S1 and S2 without gallops or murmurs.  Abdomen: Bowel sounds are positive, abdomen soft and non-tender  Msk:  Back normal, normal gait. Normal strength and tone for age. Extremities: +2 BLE Pitting edema Neuro: Alert and oriented X 3. Psych:  Good affect, responds appropriately  TELEMETRY: NSR. 90/bpm  ASSESSMENT AND PLAN: Patient presenting to the emergency department with worsening dyspnea found to have most likely COPD exacerbation as well as HFpEF exacerbation. Echocardiogram with normal LVEF, global hypokinesis, moderate LVH, Grade II diastolic dysfunction, Mild MR, and Mild to Moderate TR. With continued improvement of symptoms and stable edema, would recommend continuing IV lasix until discharge then transition to 40mg  bid with daily potassium supplement.Will sign off at this time.  Active Problems:   COPD exacerbation ([place])    Colin Sill, NP-C 01/24/2021 10:32 AM

## 2021-01-24 NOTE — Progress Notes (Signed)
Glucose rechecked result 122

## 2021-01-24 NOTE — Progress Notes (Signed)
Inpatient Diabetes Program Recommendations  AACE/ADA: New Consensus Statement on Inpatient Glycemic Control (2015)  Target Ranges:  Prepandial:   less than 140 mg/dL      Peak postprandial:   less than 180 mg/dL (1-2 hours)      Critically ill patients:  140 - 180 mg/dL   Lab Results  Component Value Date   GLUCAP >600 ([place]) 01/24/2021   HGBA1C 12.6 (H) 01/22/2021    Review of Glycemic Control Results for [name], [name] (MRN [id number]) as of 01/24/2021 14:15  Ref. Range 01/23/2021 20:37 01/23/2021 23:31 01/24/2021 07:50 01/24/2021 12:06 01/24/2021 12:09  Glucose-Capillary Latest Ref Range: 70 - 99 mg/dL 450 (H) 390 (H) 122 (H) >600 (HH) >600 (HH)   Diabetes history: DM2 Outpatient Diabetes medications: Lantus 14 units qd (Prescribed Lantus 14 units + Humalog 10 units q am ac breakfast + Glucotrol 5 mg + Actos 15 mg + Metformin 1 gm q pm) Current orders for Inpatient glycemic control: Lantus 12 units qd + Novolog 3 added tid meal coverage if eats 50% + Novolog 0-9 tid + hs 0-5  Inpatient Diabetes Program Recommendations:    Patient sees Dr. Gabriel Carina as his endocrinologist and last office visit was 08/24/20. On this visit patient was given instructions with above recommendations.  Patient states he has not been taking his humalog because his CBGs drop. Reviewed with patient the difference in short acting and long acting insulin along with checking his CBGs and keeping Dr. Gabriel Carina updated if CBGs are dropping too much. Patient is currently drinking regular soda @ bedside and Dr. Kurtis Bushman shared patient's daughter just gave patient a piece of cake. Reviewed with patient to request sugar free sodas and sent secure note to RN. Dr. Kurtis Bushman prescribed Novolog 3 units tid to start with mea coverage and adjust as needed.  Spoke with pt about  A1C results 12.6 (average blood glucose 315 over the past 2-3 months)  and explained what an A1C is, basic pathophysiology of DM Type 2, basic home care, basic diabetes diet  nutrition principles, importance of checking CBGs and maintaining good CBG control to prevent long-term and short-term complications. Reviewed signs and symptoms of hyperglycemia and hypoglycemia and how to treat hypoglycemia at home. Also reviewed blood sugar goals at home.  RNs to provide ongoing basic DM education at bedside with this patient.  Thank you, Nani Gasser. Tamyia Minich, RN, MSN, CDE  Diabetes Coordinator Inpatient Glycemic Control Team Team Pager [phone] (8am-5pm) 01/24/2021 2:42 PM

## 2021-01-24 NOTE — Progress Notes (Signed)
Colin Novak ([place] patient RN note:  Colin Novak. Colin Novak is a current hospice patient with a terminal diagnosis of lung cancer. He activated EMS on the evening of 03.28 for worsening shortness of breath. He contacted hospice to notify of transport to the hospital. He was admitted to [facility] on 03.29.22 with a diagnosis of COPD and CHF exacerbation. He is a DNR. Per Dr. Gilford Rile with [facility], this is a related admission.  Visited with patient and daughter, Colin Novak at bedside. Patient is resting in bed comfortably. Alert and oriented. Denies any pain. Lower extremity edema has improved and is at +2 pitting. Colin Novak has no questions. Plan is still to return home when medically ready.  Vital Signs: BP 109/77; HR 92; Resp 18; Temp 97.8; O2 sat 95% on RA  I & O: 830ml/ 101ml  Abnormal Labs: Potassium: 2.7 (LL) Glucose: 40 (LL) BUN: 24 (H) Creatinine: 0.42 (L) B Natriuretic Peptide: 645.5 (H)  Diagnostics: ECHO 1. Left ventricular ejection fraction, by estimation, is 50 to 55%. The  left ventricle has low normal function. The left ventricle demonstrates  global hypokinesis. There is moderate concentric left ventricular  hypertrophy. Left ventricular diastolic  parameters are consistent with Grade II diastolic dysfunction  (pseudonormalization).  2. Right ventricular systolic function is normal. The right ventricular  size is normal.  3. Left atrial size was moderately dilated.  4. Right atrial size was moderately dilated.  5. The mitral valve is normal in structure. Mild mitral valve  regurgitation. No evidence of mitral stenosis.  6. Tricuspid valve regurgitation is mild to moderate.  7. The aortic valve is normal in structure. Aortic valve regurgitation is  mild. Mild aortic valve sclerosis is present, with no evidence of aortic  valve stenosis.  8. The inferior vena cava is normal in size with greater than 50%   respiratory variability, suggesting right atrial pressure of 3 mmHg.   IV/PRN Meds: furosemide (LASIX) injection 40 mg Dose: 40 mg Freq: Every 12 hours Route: IV Start: 01/22/21 0530  potassium chloride 10 mEq in 100 mL IVPB Dose: 10 mEq Freq: Every 1 hr x 6 Route: IV Last Dose: 10 mEq (01/24/21 1453) Start: 01/24/21 0900 End: 01/24/21 1553  Problem List: Active Problems:   COPD exacerbation ([place])  1. COPD acute exacerbation.  Sx improved. 3/31-iv there were DC'd yesterday  Continue antibiotics  Continue Mucinex   2. acute diastolic heart failure  Clinically improving BNP elevated EF 50 to 55%.  Grade 2 diastolic dysfunction. Continue IV Lasix I's and O's Daily weight  3. Elevated troponin -likely due to demand ischemia , will obtain trend EF 50 to 55%.  Global hypokinesis with moderate concentric LVH. Cardiology following  4.  Hypokalemia-K2.7 Will replete with potassium IV and p.o. Check a.m. levels  5. Type II diabetes mellitus. Blood glucose levels have been uncontrolled Had regular soda Told nursing to only give sugar-free products Increase Lantus to 12 and add NovoLog 3 units 3 times daily with meals R-ISS  6. Depression. Continue celexa  7. Non-small cell lung cancer. -Pain management will be provided.  Discharge Planning: Ongoing  Family Contact: Spoke with Colin Novak at bedside.  IDG: Updated  Goals of Care: Clear  Patient remains GIP while receiving IV Lasix and IV potassium replacement for potassium of 2.7.  Please call with any hospice related questions or concerns.  Zandra Abts, RN [facility] Liaison [phone]

## 2021-01-24 NOTE — Progress Notes (Signed)
   01/24/21 1330  Clinical Encounter Type  Visited With Patient  Visit Type Spiritual support;Social support;Initial  Referral From Chaplain  Consult/Referral To Chaplain   Chaplain met Pt while doing rounds. PT spoke of the joy of being able to be released to go home. He expressed his emotions regarding his health, and the fact he is glad to be alive. PT ministered with a calming presence, and reflective listening.

## 2021-01-25 DIAGNOSIS — C3490 Malignant neoplasm of unspecified part of unspecified bronchus or lung: Secondary | ICD-10-CM

## 2021-01-25 LAB — CULTURE, BLOOD (ROUTINE X 2): Special Requests: ADEQUATE

## 2021-01-25 LAB — GLUCOSE, CAPILLARY
Glucose-Capillary: >600 mg/dL (ref 70–99)
Glucose-Capillary: 287 mg/dL — ABNORMAL HIGH (ref 70–99)
Glucose-Capillary: 256 mg/dL — ABNORMAL HIGH (ref 70–99)
Glucose-Capillary: 177 mg/dL — ABNORMAL HIGH (ref 70–99)
Glucose-Capillary: 91 mg/dL (ref 70–99)
Glucose-Capillary: 362 mg/dL — ABNORMAL HIGH (ref 70–99)

## 2021-01-25 LAB — BASIC METABOLIC PANEL
Anion gap: 10 (ref 5–15)
BUN: 23 mg/dL (ref 8–23)
CO2: 28 mmol/L (ref 22–32)
Calcium: 8.9 mg/dL (ref 8.9–10.3)
Chloride: 97 mmol/L — ABNORMAL LOW (ref 98–111)
Creatinine, Ser: 0.64 mg/dL (ref 0.61–1.24)
GFR, Estimated: >60 mL/min (ref >60)
Glucose, Bld: 296 mg/dL — ABNORMAL HIGH (ref 70–99)
Potassium: 4.3 mmol/L (ref 3.5–5.1)
Sodium: 135 mmol/L (ref 135–145)

## 2021-01-25 LAB — ELECTROLYTE PANEL
Anion gap: 8 (ref 5–15)
CO2: 28 mmol/L (ref 22–32)
Chloride: 99 mmol/L (ref 98–111)
Potassium: 4.1 mmol/L (ref 3.5–5.1)
Sodium: 135 mmol/L (ref 135–145)

## 2021-01-25 LAB — TROPONIN I (HIGH SENSITIVITY)
Troponin I (High Sensitivity): 24 ng/L — ABNORMAL HIGH (ref <18)
Troponin I (High Sensitivity): 23 ng/L — ABNORMAL HIGH (ref <18)

## 2021-01-25 LAB — POTASSIUM: Potassium: 3.9 mmol/L (ref 3.5–5.1)

## 2021-01-25 MED ORDER — BISOPROLOL FUMARATE 5 MG PO TABS
5.0000 mg | ORAL_TABLET | Freq: Every day | ORAL | Status: DC
  Filled 2021-01-25: qty 1

## 2021-01-25 MED ORDER — METOPROLOL SUCCINATE ER 25 MG PO TB24
25.0000 mg | ORAL_TABLET | Freq: Every day | ORAL | Status: DC
  Administered 2021-01-26 – 2021-01-27 (×2): 25 mg via ORAL
  Filled 2021-01-25 (×2): qty 1

## 2021-01-25 MED ORDER — SODIUM CHLORIDE 0.9 % IV BOLUS
250.0000 mL | Freq: Once | INTRAVENOUS | Status: AC
  Administered 2021-01-25: 250 mL via INTRAVENOUS

## 2021-01-25 MED ORDER — BISOPROLOL FUMARATE 5 MG PO TABS: 2.5000 mg | ORAL_TABLET | Freq: Every day | ORAL | Status: DC

## 2021-01-25 MED ORDER — INSULIN ASPART 100 UNIT/ML ~~LOC~~ SOLN: 0.0000 [IU] | Freq: Three times a day (TID) | SUBCUTANEOUS | Status: DC

## 2021-01-25 MED ORDER — INSULIN ASPART 100 UNIT/ML ~~LOC~~ SOLN
0.0000 [IU] | Freq: Every day | SUBCUTANEOUS | Status: DC
  Administered 2021-01-25: 5 [IU] via SUBCUTANEOUS
  Filled 2021-01-25: qty 1

## 2021-01-25 MED ORDER — MIDODRINE HCL 5 MG PO TABS
5.0000 mg | ORAL_TABLET | Freq: Three times a day (TID) | ORAL | Status: DC
  Administered 2021-01-25 – 2021-01-27 (×7): 5 mg via ORAL
  Filled 2021-01-25 (×7): qty 1

## 2021-01-25 MED ORDER — INSULIN ASPART 100 UNIT/ML ~~LOC~~ SOLN: 0.0000 [IU] | Freq: Every day | SUBCUTANEOUS | Status: DC

## 2021-01-25 MED ORDER — INSULIN ASPART 100 UNIT/ML ~~LOC~~ SOLN
0.0000 [IU] | Freq: Three times a day (TID) | SUBCUTANEOUS | Status: DC
  Administered 2021-01-26: 2 [IU] via SUBCUTANEOUS
  Administered 2021-01-26: 3 [IU] via SUBCUTANEOUS
  Administered 2021-01-26: 5 [IU] via SUBCUTANEOUS
  Administered 2021-01-27 (×2): 1 [IU] via SUBCUTANEOUS
  Filled 2021-01-25 (×5): qty 1

## 2021-01-25 MED ORDER — MIDODRINE HCL 5 MG PO TABS
5.0000 mg | ORAL_TABLET | Freq: Once | ORAL | Status: AC
  Administered 2021-01-25: 5 mg via ORAL
  Filled 2021-01-25: qty 1

## 2021-01-25 NOTE — Progress Notes (Signed)
Mobility Specialist - Progress Note   01/25/21 1245  Mobility  Activity Contraindicated/medical hold  Mobility performed by Mobility specialist    Per discussion with nurse, hold session this date d/t elevated HR this AM. Will attempt session at another date/time when medically appropriate.    Kathee Delton Mobility Specialist 01/25/21, 12:46 PM

## 2021-01-25 NOTE — Plan of Care (Signed)
Pt is AAOx4. Pt denies any pain at this time. VSS. Pt afebrile. BG this morning was covered with 5u, BG was 287. All needs attended. Call light is within reach. Safety measures maintained.    Problem: Education: Goal: Knowledge of General Education information will improve Description: Including pain rating scale, medication(s)/side effects and non-pharmacologic comfort measures Outcome: Progressing   Problem: Health Behavior/Discharge Planning: Goal: Ability to manage health-related needs will improve Outcome: Progressing   Problem: Clinical Measurements: Goal: Ability to maintain clinical measurements within normal limits will improve Outcome: Progressing Goal: Will remain free from infection Outcome: Progressing Goal: Diagnostic test results will improve Outcome: Progressing Goal: Respiratory complications will improve Outcome: Progressing Goal: Cardiovascular complication will be avoided Outcome: Progressing   Problem: Activity: Goal: Risk for activity intolerance will decrease Outcome: Progressing   Problem: Nutrition: Goal: Adequate nutrition will be maintained Outcome: Progressing   Problem: Coping: Goal: Level of anxiety will decrease Outcome: Progressing   Problem: Elimination: Goal: Will not experience complications related to bowel motility Outcome: Progressing Goal: Will not experience complications related to urinary retention Outcome: Progressing   Problem: Pain Managment: Goal: General experience of comfort will improve Outcome: Progressing   Problem: Safety: Goal: Ability to remain free from injury will improve Outcome: Progressing   Problem: Skin Integrity: Goal: Risk for impaired skin integrity will decrease Outcome: Progressing

## 2021-01-25 NOTE — Progress Notes (Signed)
PROGRESS NOTE    Colin Novak  IZT:[id number] DOB: Jun 19, 1946 DOA: 01/22/2021 PCP: Patient, No Pcp Per (Inactive)    Brief Narrative:  Colin Novak is a 75 y.o. cachectic Caucasian male with medical history significant for COPD, type diabetes mellitus, GERD, dyslipidemia and anxiety as well as lung cancer, presented to the emergency room with a [place] of worsening dyspnea over the last couple of days with associated cough productive of clear whitish sputum as well as wheezing.  He denies any fever or chills.  No chest pain or palpitations.  No nausea or vomiting or abdominal pain.  No dysuria, oliguria or hematuria or flank pain.  Pulsoxymeter is 90% on room air.  3/31- feels better. Sugar was low 40 this am, now up since he was drinking regular soda 4/1-was called by nsg pt HR 160-175, sbp 60's. This am received lasix prior to me discontinuing it. Given 250 ns bolus x3. HR coming down. Place bisoprolol if able to take if sbp up. Cards notified, Dr. Humphrey Rolls aware.  Consultants:   Cardiology   Procedures: Echo  Antimicrobials:       Subjective: Feels sob, no dizziness, no cp  Objective: Vitals:   01/25/21 1129 01/25/21 1209 01/25/21 1313 01/25/21 1330  BP: (!) 90/52  (!) 82/44 (!) 82/44  Pulse: (!) 122 (!) 109  (!) 117  Resp: 16   18  Temp: 98 F (36.7 C)   98 F (36.7 C)  TempSrc: Oral   Oral  SpO2: 98% 98%  98%  Weight:      Height:        Intake/Output Summary (Last 24 hours) at 01/25/2021 1512 Last data filed at 01/25/2021 1340 Gross per 24 hour  Intake 869.37 ml  Output 2900 ml  Net -2030.63 ml   Filed Weights   01/22/21 0055 01/22/21 1953 01/24/21 0339  Weight: 55.6 kg 53.9 kg 49 kg    Examination: Calm, nad Decrease bs , no w/r Tachycardic , regular s1/s2 Soft benign +bs No edema Awake and alert  Data Reviewed: I have personally reviewed following labs and imaging studies  CBC: Recent Labs  Lab 01/18/21 1540 01/22/21 0104 01/22/21 0423  01/23/21 0455  WBC 6.4 5.2 6.5 6.5  NEUTROABS  --  3.4  --   --   HGB 12.3* 11.5* 11.9* 11.2*  HCT 36.8* 34.7* 34.6* 34.3*  MCV 101.4* 102.1* 100.0 103.6*  PLT 132* 236 225 382   Basic Metabolic Panel: Recent Labs  Lab 01/22/21 0104 01/22/21 0423 01/23/21 0455 01/23/21 2111 01/24/21 0557 01/24/21 1449 01/25/21 0555 01/25/21 1156  NA 141 140 139  --  142  --  135  --   K 3.4* 3.5 3.9  --  2.7*  --  4.3 3.9  CL 103 103 106  --  99  --  97*  --   CO2 29 28 26   --  30  --  28  --   GLUCOSE 183* 285* 133* 466* 40* 451* 296*  --   BUN 12 13 21   --  24*  --  23  --   CREATININE 0.46* 0.46* 0.40*  --  0.42*  --  0.64  --   CALCIUM 8.8* 8.7* 8.8*  --  9.4  --  8.9  --   MG  --   --   --   --  2.0  --   --   --    GFR: Estimated Creatinine Clearance: 55.3 mL/min (by  C-G formula based on SCr of 0.64 mg/dL). Liver Function Tests: Recent Labs  Lab 01/18/21 1540  AST 36  ALT 25  ALKPHOS 69  BILITOT 0.8  PROT 6.1*  ALBUMIN 3.3*   No results for input(s): LIPASE, AMYLASE in the last 168 hours. No results for input(s): AMMONIA in the last 168 hours. Coagulation Profile: Recent Labs  Lab 01/22/21 0550  INR 1.0   Cardiac Enzymes: No results for input(s): CKTOTAL, CKMB, CKMBINDEX, TROPONINI in the last 168 hours. BNP (last 3 results) No results for input(s): PROBNP in the last 8760 hours. HbA1C: No results for input(s): HGBA1C in the last 72 hours. CBG: Recent Labs  Lab 01/24/21 1640 01/24/21 2042 01/25/21 0246 01/25/21 0809 01/25/21 1208  GLUCAP 194* 113* 287* 256* 177*   Lipid Profile: No results for input(s): CHOL, HDL, LDLCALC, TRIG, CHOLHDL, LDLDIRECT in the last 72 hours. Thyroid Function Tests: No results for input(s): TSH, T4TOTAL, FREET4, T3FREE, THYROIDAB in the last 72 hours. Anemia Panel: No results for input(s): VITAMINB12, FOLATE, FERRITIN, TIBC, IRON, RETICCTPCT in the last 72 hours. Sepsis Labs: No results for input(s): PROCALCITON, LATICACIDVEN  in the last 168 hours.  Recent Results (from the past 240 hour(s))  Resp Panel by RT-PCR (Flu A&B, Covid) Nasopharyngeal Swab     Status: None   Collection Time: 01/22/21  1:05 AM   Specimen: Nasopharyngeal Swab; Nasopharyngeal(NP) swabs in vial transport medium  Result Value Ref Range Status   SARS Coronavirus 2 by RT PCR NEGATIVE NEGATIVE Final    Comment: (NOTE) SARS-CoV-2 target nucleic acids are NOT DETECTED.  The SARS-CoV-2 RNA is generally detectable in upper respiratory specimens during the acute phase of infection. The lowest concentration of SARS-CoV-2 viral copies this assay can detect is 138 copies/mL. A negative result does not preclude SARS-Cov-2 infection and should not be used as the sole basis for treatment or other patient management decisions. A negative result may occur with  improper specimen collection/handling, submission of specimen other than nasopharyngeal swab, presence of viral mutation(s) within the areas targeted by this assay, and inadequate number of viral copies(<138 copies/mL). A negative result must be combined with clinical observations, patient history, and epidemiological information. The expected result is Negative.  Fact Sheet for Patients:  EntrepreneurPulse.com.au  Fact Sheet for Healthcare Providers:  IncredibleEmployment.be  This test is no t yet approved or cleared by the [place] FDA and  has been authorized for detection and/or diagnosis of SARS-CoV-2 by FDA under an Emergency Use Authorization (EUA). This EUA will remain  in effect (meaning this test can be used) for the duration of the COVID-19 declaration under Section 564(b)(1) of the Act, 21 U.S.C.section 360bbb-3(b)(1), unless the authorization is terminated  or revoked sooner.       Influenza A by PCR NEGATIVE NEGATIVE Final   Influenza B by PCR NEGATIVE NEGATIVE Final    Comment: (NOTE) The Xpert Xpress SARS-CoV-2/FLU/RSV plus  assay is intended as an aid in the diagnosis of influenza from Nasopharyngeal swab specimens and should not be used as a sole basis for treatment. Nasal washings and aspirates are unacceptable for Xpert Xpress SARS-CoV-2/FLU/RSV testing.  Fact Sheet for Patients: EntrepreneurPulse.com.au  Fact Sheet for Healthcare Providers: IncredibleEmployment.be  This test is not yet approved or cleared by the [place] FDA and has been authorized for detection and/or diagnosis of SARS-CoV-2 by FDA under an Emergency Use Authorization (EUA). This EUA will remain in effect (meaning this test can be used) for the duration of the  COVID-19 declaration under Section 564(b)(1) of the Act, 21 U.S.C. section 360bbb-3(b)(1), unless the authorization is terminated or revoked.  Performed at [facility], [address] [place], [place] [address]   Culture, blood (Routine X 2) w Reflex to ID Panel     Status: Abnormal   Collection Time: 01/22/21  4:23 AM   Specimen: BLOOD  Result Value Ref Range Status   Specimen Description   Final    BLOOD LEFT [facility] Performed at [facility], [address], [place], [place] [address]    Special Requests   Final    BOTTLES DRAWN AEROBIC AND ANAEROBIC Blood Culture adequate volume Performed at [facility], [address] [place], [place] [address]    Culture  Setup Time   Final    GRAM POSITIVE COCCI ANAEROBIC BOTTLE ONLY Organism ID to follow CRITICAL RESULT CALLED TO, READ BACK BY AND VERIFIED WITHVioleta Gelinas [facility] 0932 01/23/21 HNM Performed at  [facility], [address] [place], [place] [address]    Culture (A)  Final    STAPHYLOCOCCUS EPIDERMIDIS THE SIGNIFICANCE OF ISOLATING THIS ORGANISM FROM A SINGLE SET OF BLOOD CULTURES WHEN MULTIPLE SETS ARE DRAWN IS UNCERTAIN. PLEASE NOTIFY THE MICROBIOLOGY DEPARTMENT WITHIN ONE WEEK IF SPECIATION AND SENSITIVITIES ARE  REQUIRED. Performed at [facility], [place] [address], [place],  [address]    Report Status 01/25/2021 FINAL  Final  Blood Culture ID Panel (Reflexed)     Status: Abnormal   Collection Time: 01/22/21  4:23 AM  Result Value Ref Range Status   Enterococcus faecalis NOT DETECTED NOT DETECTED Final   Enterococcus Faecium NOT DETECTED NOT DETECTED Final   Listeria monocytogenes NOT DETECTED NOT DETECTED Final   Staphylococcus species DETECTED (A) NOT DETECTED Final    Comment: CRITICAL RESULT CALLED TO, READ BACK BY AND VERIFIED WITH: JASON ROBBINS PHARMD 0226 01/23/21 HNM    Staphylococcus aureus (BCID) NOT DETECTED NOT DETECTED Final   Staphylococcus epidermidis DETECTED (A) NOT DETECTED Final    Comment: CRITICAL RESULT CALLED TO, READ BACK BY AND VERIFIED WITH: JASON ROBBINS PHARMD 0226 01/23/21 HNM    Staphylococcus lugdunensis NOT DETECTED NOT DETECTED Final   Streptococcus species NOT DETECTED NOT DETECTED Final   Streptococcus agalactiae NOT DETECTED NOT DETECTED Final   Streptococcus pneumoniae NOT DETECTED NOT DETECTED Final   Streptococcus pyogenes NOT DETECTED NOT DETECTED Final   A.calcoaceticus-baumannii NOT DETECTED NOT DETECTED Final   Bacteroides fragilis NOT DETECTED NOT DETECTED Final   Enterobacterales NOT DETECTED NOT DETECTED Final   Enterobacter cloacae complex NOT DETECTED NOT DETECTED Final   Escherichia coli NOT DETECTED NOT DETECTED Final   Klebsiella aerogenes NOT DETECTED NOT DETECTED Final   Klebsiella oxytoca NOT DETECTED NOT DETECTED Final   Klebsiella pneumoniae NOT DETECTED NOT DETECTED Final   Proteus species NOT DETECTED NOT DETECTED Final   Salmonella species NOT DETECTED NOT DETECTED Final   Serratia marcescens NOT DETECTED NOT DETECTED Final   Haemophilus influenzae NOT DETECTED NOT DETECTED Final   Neisseria meningitidis NOT DETECTED NOT DETECTED Final   Pseudomonas aeruginosa NOT DETECTED NOT DETECTED Final   Stenotrophomonas  maltophilia NOT DETECTED NOT DETECTED Final   Candida albicans NOT DETECTED NOT DETECTED Final   Candida auris NOT DETECTED NOT DETECTED Final   Candida glabrata NOT DETECTED NOT DETECTED Final   Candida krusei NOT DETECTED NOT DETECTED Final   Candida parapsilosis NOT DETECTED NOT DETECTED Final   Candida tropicalis NOT DETECTED NOT  DETECTED Final   Cryptococcus neoformans/gattii NOT DETECTED NOT DETECTED Final   Methicillin resistance mecA/C NOT DETECTED NOT DETECTED Final    Comment: Performed at [facility], [address] [place], [place] [address]  Culture, blood (Routine X 2) w Reflex to ID Panel     Status: None (Preliminary result)   Collection Time: 01/22/21  4:24 AM   Specimen: BLOOD  Result Value Ref Range Status   Specimen Description BLOOD RIGHT FA  Final   Special Requests   Final    BOTTLES DRAWN AEROBIC AND ANAEROBIC Blood Culture adequate volume   Culture   Final    NO GROWTH 3 DAYS Performed at [facility], [address], [place],  [address]    Report Status PENDING  Incomplete         Radiology Studies: No results found.      Scheduled Meds: . aspirin EC  81 mg Oral Daily  . atorvastatin  80 mg Oral Daily  . azithromycin  500 mg Oral Daily  . bisoprolol  5 mg Oral Daily  . budesonide (PULMICORT) nebulizer solution  0.25 mg Nebulization BID  . citalopram  20 mg Oral Daily  . enoxaparin (LOVENOX) injection  40 mg Subcutaneous Q24H  . famotidine  20 mg Oral Daily  . feeding supplement  237 mL Oral TID BM  . fludrocortisone  100 mcg Oral Daily  . guaiFENesin  600 mg Oral BID  . insulin aspart  0-9 Units Subcutaneous Q4H  . insulin aspart  3 Units Subcutaneous TID WC  . insulin glargine  12 Units Subcutaneous Daily  . megestrol  400 mg Oral Daily  . midodrine  5 mg Oral TID WC  . multivitamin with minerals  1 tablet Oral Daily   Continuous Infusions:   Assessment & Plan:   Active Problems:   COPD exacerbation  ([place])   1.  COPD acute exacerbation.  Sx improved. 4/1- was on iv steroid which was discontinued. Continue abx , mucinex Dc nebs as pt in svt today     2.  acute diastolic heart failure  Improving. BNP was elevated  EF 50 to 55%.  Grade 2 diastolic dysfunction.   Lasix discontinue but did receive early a.m. dose today, he is hypotensive Continue I's and O's  Daily weight    3.SVT- this am.  bp low, but asx Will give bolus ivf  Add bisoprolol low dose if bp tolerates Cards made aware.     4.  Elevated troponin -likely due to demand ischemia , will obtain trend EF 50 to 55%.  Global hypokinesis with moderate concentric LVH. Cardiology following 4/1-rechecked as pt had svt. Mildly elevated, and trending down. Asx.   4.  Hypokalemia- Improved and stable. K 3.9 Mg 2   5.  Type II diabetes mellitus. Blood glucose levels have been uncontrolled Told nursing to only give sugar-free products continue Lantus to 12 and add NovoLog 3 units 3 times daily with meals R-ISS  6.  Depression. Continue celexa  7.  Non-small cell lung cancer. -Pain management will be provided.   DVT prophylaxis: Lovenox Code Status: DNR Family Communication: Daughter updated via phone  Status is: Inpatient  Remains inpatient appropriate because:Inpatient level of care appropriate due to severity of illness   Dispo: The patient is from: Home              Anticipated d/c is to: Home  Patient currently is not medically stable to d/c.   Difficult to place patient No            LOS: 3 days   Time spent: 45 min with >50% on coc    Nolberto Hanlon, MD [facility] Pager [phone]  If 7PM-7AM, please contact night-coverage 01/25/2021, 3:12 PM

## 2021-01-25 NOTE — Progress Notes (Signed)
Had SVT, possible medication interaction, with Celexa and zithromax, advise EKG/LABS with K, mg and find ulternative antibiotics.

## 2021-01-25 NOTE — Progress Notes (Signed)
[place] [facility] [place] ([place] patient RN note:  Colin Novak. Colin Novak is a current hospice patient with a terminal diagnosis of lung cancer. He activated EMS on the evening of 03.28 for worsening shortness of breath. He contacted hospice to notify of transport to the hospital. He was admitted to [facility] on 03.29.22 with a diagnosis of COPD and CHF exacerbation. He is a DNR. Per Dr. Gilford Rile with [facility], this is a related admission.  Visited with patient at bedside. He stated that he had a dizzy spell this morning and his BP had dropped. Reports he is feeling better now. He is sitting up in bed. Alert and oriented. Reports no edema to lower extremities. Denies any pain. Has been SOB and is on 4 Liters O2. Productive cough with notable wheezing. Spoke with daughter, Colin Novak over the phone to provide update. Report exchanged with bedside RN.  PT evaluation today was cancelled due to hypotension and tachycardia. Bolus of IVF was given and new medication Midodrine was started. Plan is still to discharge home once he is medically stable.  Vital Signs: BP 91/64; HR 99; Resp 18; Temp 97.9; O2 sat 98% on 4 L [place]  I & O: 1482ml/3800ml  Abnormal labs: Chloride: 97 (L) Glucose: 296 (H)  Diagnostics: None new  IV/PRN Meds: midodrine (PROAMATINE) tablet 5 mg Dose: 5 mg Freq: 3 times daily with meals Route: PO Start: 01/25/21 1045  furosemide (LASIX) injection 40 mg Dose: 40 mg Freq: Every 12 hours Route: IV Start: 01/22/21 0530 End: 01/25/21 0833  potassium chloride 10 mEq in 100 mL IVPB Dose: 10 mEq Freq: Every 1 hr x 6 Route: IV Last Dose: 10 mEq (01/24/21 1618)  sodium chloride 0.9 % bolus 250 mL Dose: 250 mL Freq: Once Route: IV Last Dose: 250 mL (01/25/21 1001) Start: 01/25/21 1045 End: 01/25/21 1001  Problem List: Active Problems:   COPD exacerbation ([name])  1. COPD acute exacerbation.  Sx improved. 3/31-iv there were DC'd  yesterday  Continue antibiotics  Continue Mucinex   2. acute diastolic heart failure  Clinically improving BNP elevated EF 50 to 55%.  Grade 2 diastolic dysfunction. Continue IV Lasix I's and O's Daily weight  3. Elevated troponin -likely due to demand ischemia , will obtain trend EF 50 to 55%.  Global hypokinesis with moderate concentric LVH. Cardiology following  4.  Hypokalemia-K2.7 Will replete with potassium IV and p.o. Check a.m. levels  5. Type II diabetes mellitus. Blood glucose levels have been uncontrolled Had regular soda Told nursing to only give sugar-free products Increase Lantus to 12 and add NovoLog 3 units 3 times daily with meals R-ISS  6. Depression. Continue celexa  7. Non-small cell lung cancer. -Pain management will be provided.  Discharge Planning: To home when medically ready.  Family contact: Spoke with daughter, Colin Novak  IDG: Updated  Goals of care: Clear  Patient remains GIP appropriate due to severity of illness. Blood pressure unstable and new medication started.  Please call with any hospice related questions or concerns.  Zandra Abts, RN [facility] Liaison [phone]

## 2021-01-25 NOTE — Progress Notes (Signed)
PT Cancellation Note  Patient Details Name: Colin Novak MRN: [id number] DOB: [birth year]   Cancelled Treatment:    Reason Eval/Treat Not Completed: Medical issues which prohibited therapy (Pt feeling 'woozie,' RN/MD at bedside intermittently, pressures quite low, 60s/50s awaiting manual cuff reassessment; tele showing rates in 170s sustained.) Will defer PT session at this time, resume treatment once patient is medically appropriate at later date/time.    Dalphine Cowie C 01/25/2021, 9:56 AM

## 2021-01-25 NOTE — Progress Notes (Signed)
   01/25/21 1129  Assess: MEWS Score  Temp 98 F (36.7 C)  BP (!) 90/52  Pulse Rate (!) 122  Resp 16  Level of Consciousness Alert  SpO2 98 %  O2 Device Room Air  Assess: MEWS Score  MEWS Temp 0  MEWS Systolic 1  MEWS Pulse 2  MEWS RR 0  MEWS LOC 0  MEWS Score 3  MEWS Score Color Yellow  Assess: if the MEWS score is Yellow or Red  Were vital signs taken at a resting state? Yes  Focused Assessment No change from prior assessment  Early Detection of Sepsis Score *See Row Information* Low  MEWS guidelines implemented *See Row Information* Yes  Treat  MEWS Interventions Administered scheduled meds/treatments  Pain Scale 0-10  Pain Score 0  Take Vital Signs  Increase Vital Sign Frequency  Yellow: Q 2hr X 2 then Q 4hr X 2, if remains yellow, continue Q 4hrs  Escalate  MEWS: Escalate Yellow: discuss with charge nurse/RN and consider discussing with provider and RRT  Notify: Charge Nurse/RN  Name of Charge Nurse/RN Notified Colletta [place]  Date Charge Nurse/RN Notified 01/25/21  Time Charge Nurse/RN Notified 1140  Notify: Provider  Provider Name/Title Amery  Date Provider Notified 01/25/21  Time Provider Notified 1130  Notification Type Face-to-face  Notification Reason Other (Comment) (Hypotension/ Elevated HR)  Provider response At bedside  Date of Provider Response 01/25/21  Time of Provider Response 1130

## 2021-01-26 DIAGNOSIS — E43 Unspecified severe protein-calorie malnutrition: Secondary | ICD-10-CM | POA: Insufficient documentation

## 2021-01-26 LAB — BASIC METABOLIC PANEL
Anion gap: 7 (ref 5–15)
BUN: 28 mg/dL — ABNORMAL HIGH (ref 8–23)
CO2: 26 mmol/L (ref 22–32)
Calcium: 8.6 mg/dL — ABNORMAL LOW (ref 8.9–10.3)
Chloride: 100 mmol/L (ref 98–111)
Creatinine, Ser: 0.43 mg/dL — ABNORMAL LOW (ref 0.61–1.24)
GFR, Estimated: >60 mL/min (ref >60)
Glucose, Bld: 143 mg/dL — ABNORMAL HIGH (ref 70–99)
Potassium: 3.9 mmol/L (ref 3.5–5.1)
Sodium: 133 mmol/L — ABNORMAL LOW (ref 135–145)

## 2021-01-26 LAB — CBC
HCT: 40.3 % (ref 39.0–52.0)
Hemoglobin: 14 g/dL (ref 13.0–17.0)
MCH: 34.4 pg — ABNORMAL HIGH (ref 26.0–34.0)
MCHC: 34.7 g/dL (ref 30.0–36.0)
MCV: 99 fL (ref 80.0–100.0)
Platelets: 302 10*3/uL (ref 150–400)
RBC: 4.07 MIL/uL — ABNORMAL LOW (ref 4.22–5.81)
RDW: 14.4 % (ref 11.5–15.5)
WBC: 4.6 10*3/uL (ref 4.0–10.5)
nRBC: 0 % (ref 0.0–0.2)

## 2021-01-26 LAB — GLUCOSE, CAPILLARY
Glucose-Capillary: 155 mg/dL — ABNORMAL HIGH (ref 70–99)
Glucose-Capillary: 285 mg/dL — ABNORMAL HIGH (ref 70–99)
Glucose-Capillary: 236 mg/dL — ABNORMAL HIGH (ref 70–99)
Glucose-Capillary: 156 mg/dL — ABNORMAL HIGH (ref 70–99)

## 2021-01-26 MED ORDER — FUROSEMIDE 10 MG/ML IJ SOLN
20.0000 mg | Freq: Every day | INTRAMUSCULAR | Status: DC
  Administered 2021-01-26 – 2021-01-27 (×2): 20 mg via INTRAVENOUS
  Filled 2021-01-26 (×2): qty 2

## 2021-01-26 NOTE — Evaluation (Signed)
Occupational Therapy Evaluation Patient Details Name: Colin Novak MRN: [id number] DOB: [birth year] Today's Date: 01/26/2021    History of Present Illness Colin Novak is a 58yoM followed by hospice at home 2/2 StgIV [name] comes to [facility] on 3/29 c progression of SOB, LEE x8-10 days. PMH: ED post syncope at home 01/18/21. stg4 LungCA in remission, COPD, GAD, DM2, HLD, GERD.  Pt lives alone at home, ADL support from Hospice, chronic DOE limiting >household AMB distances.  1 recent fall in 6 months 2/2 syncope which pt reports was related to acute BG drop.   Clinical Impression   Pt referred for OT evaluation this date.  Patient is a 75 yo male with lung CA who is followed by hospice care at home.  Pt was admitted to [facility] after syncopal episode and reports he had no warning at home when he passed out.  He reports progressive weakness over the last few weeks, lives at home alone and has family who comes by occasionally.  His daughter and sister helps with cooking, transportation, cleaning as well as grocery shopping.  He presents with muscle weakness, decreased balance, decreased transfers, decreased functional mobility and decreased ability to perform ADL and IADL tasks.  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  Recommended patient use a walker at all times for safety with mobility.  Given patient's current level of ability, weakness, decreased activity tolerance and balance deficits, OT recommends 24 hour supervision if patient returns home.      Follow Up Recommendations  Supervision/Assistance - 24 hour    Equipment Recommendations       Recommendations for Other Services       Precautions / Restrictions Precautions Precautions: Fall      Mobility Bed Mobility Overal bed mobility: Modified Independent Bed Mobility: Supine to Sit;Sit to Supine     Supine to sit: Modified  independent (Device/Increase time) Sit to supine: Modified independent (Device/Increase time)        Transfers Overall transfer level: Needs assistance   Transfers: Sit to/from Stand Sit to Stand: Min guard         General transfer comment: patient is unsafe to ambulate without an AD, recommend RW and min guard for safety    Balance Overall balance assessment: Needs assistance Sitting-balance support: No upper extremity supported;Feet supported Sitting balance-Leahy Scale: Good     Standing balance support: Single extremity supported;During functional activity Standing balance-Leahy Scale: Poor Standing balance comment: reports spasticity in right leg from previous accident years ago             High level balance activites: Direction changes High Level Balance Comments: decreased safety with turns and remains high fall risk           ADL either performed or assessed with clinical judgement   ADL Overall ADL's : Needs assistance/impaired Eating/Feeding: Independent   Grooming: Standing;Min guard   Upper Body Bathing: Set up;Minimal assistance   Lower Body Bathing: Set up;Moderate assistance   Upper Body Dressing : Set up   Lower Body Dressing: Min guard Lower Body Dressing Details (indicate cue type and reason): Able to don and doff socks from a semi supine position but not able to complete in sitting. Toilet Transfer: Magazine features editor Details (indicate cue type and reason): min guard to transfer to [facility] for BM today with use of walker Toileting- Clothing  Manipulation and Hygiene: Min guard Toileting - Clothing Manipulation Details (indicate cue type and reason): able to wipe self after BM from seated position, min guard to stand and adjust clothing.     Functional mobility during ADLs: Min guard;Minimal assistance General ADL Comments: Patient with increased weakness and decreased activity tolerance during self care tasks.  Has to dress in long  sitting in the bed.  Grooming at the sink with min guard.  Pt has not been ambulating with walker at home, he is unsafe to perform mobility without an AD.  With the walker he still requires min guard for safety, decreased balance with turns, one LOB during turn with assist for recovery.     Vision Baseline Vision/History: Wears glasses       Perception     Praxis      Pertinent Vitals/Pain Pain Assessment: No/denies pain     Hand Dominance Right   Extremity/Trunk Assessment Upper Extremity Assessment Upper Extremity Assessment: Generalized weakness   Lower Extremity Assessment Lower Extremity Assessment: Defer to PT evaluation       Communication Communication Communication: HOH   Cognition Arousal/Alertness: Awake/alert Behavior During Therapy: WFL for tasks assessed/performed Overall Cognitive Status: Within Functional Limits for tasks assessed                                     General Comments       Exercises     Shoulder Instructions      Home Living Family/patient expects to be discharged to:: Private residence Living Arrangements: Alone Available Help at Discharge: Family Type of Home: House Home Access: Stairs to enter [employer] of Steps: 2 steps front, 3 in back Entrance Stairs-Rails: None Home Layout: One level     Bathroom Shower/Tub: Tub/shower unit;Curtain   Bathroom Toilet: Handicapped height     Home Equipment: Environmental consultant - 2 wheels;Cane - single point;Bedside commode   Additional Comments: intermittent use of hands on furniture at night; no regular use of RW; syncopal fall 5 days ago at home; has  a standard  queen bed modified for elevated HOB      Prior Functioning/Environment Level of Independence: Needs assistance  Gait / Transfers Assistance Needed: house hold ambulator ADL's / Homemaking Assistance Needed: reports his daughter and sister helps with cooking, cleaning and buying groceries.  Hospice has been  helping with bathing.  Was previously independent with light meal prep such as sandwiches and dressing skills            OT Problem List: Decreased strength;Decreased activity tolerance;Decreased knowledge of use of DME or AE;Impaired balance (sitting and/or standing);Decreased safety awareness      OT Treatment/Interventions: Self-care/ADL training;Energy conservation;Balance training;Therapeutic exercise;DME and/or AE instruction;Therapeutic activities;Patient/family education    OT Goals(Current goals can be found in the care plan section) Acute Rehab OT Goals Patient Stated Goal: to go home, be able to care for himself OT Goal Formulation: With patient Time For Goal Achievement: 02/09/21 Potential to Achieve Goals: Fair ADL Goals Pt Will Perform Lower Body Dressing: with modified independence Pt Will Transfer to Toilet: with modified independence Additional ADL Goal #1: Pt to increase strength by 1 mm grade to assist with self care tasks  OT Frequency: Min 2X/week   Barriers to D/C:            Co-evaluation  AM-PAC OT "6 Clicks" Daily Activity     Outcome Measure Help from another person eating meals?: None Help from another person taking care of personal grooming?: A Little Help from another person toileting, which includes using toliet, bedpan, or urinal?: A Little Help from another person bathing (including washing, rinsing, drying)?: A Lot Help from another person to put on and taking off regular upper body clothing?: A Little Help from another person to put on and taking off regular lower body clothing?: A Little 6 Click Score: 18   End of Session Equipment Utilized During Treatment: Gait belt  Activity Tolerance: Patient tolerated treatment well Patient left: in bed;with call bell/phone within reach;with bed alarm set  OT Visit Diagnosis: Unsteadiness on feet (R26.81);History of falling (Z91.81);Muscle weakness (generalized) (M62.81)                 Time: 5749-3552 OT Time Calculation (min): 23 min Charges:  OT General Charges $OT Visit: 1 Visit OT Evaluation $OT Eval Low Complexity: 1 Low OT Treatments $Self Care/Home Management : 8-22 mins  Hien Cunliffe T Kaleesi Guyton, OTR/L, CLT   Teshara Moree 01/26/2021, 4:01 PM

## 2021-01-26 NOTE — Progress Notes (Signed)
SUBJECTIVE: Patient denies any palpitation no chest pain or shortness of breath   Vitals:   01/25/21 2151 01/26/21 0441 01/26/21 0802 01/26/21 1143  BP: (!) 101/58 106/73 105/67 101/63  Pulse: 90 77 89 93  Resp: 18 16 16 18   Temp: 98 F (36.7 C) (!) 97.5 F (36.4 C) 97.7 F (36.5 C) (!) 97.5 F (36.4 C)  TempSrc: Oral  Oral Oral  SpO2: 97% 95% 95% 93%  Weight:      Height:        Intake/Output Summary (Last 24 hours) at 01/26/2021 1200 Last data filed at 01/26/2021 0920 Gross per 24 hour  Intake 1080 ml  Output 1300 ml  Net -220 ml    LABS: Basic Metabolic Panel: Recent Labs    01/24/21 0557 01/24/21 1449 01/25/21 0555 01/25/21 1156 01/25/21 1534 01/26/21 0611  NA 142  --  135  --  135 133*  K 2.7*  --  4.3   < > 4.1 3.9  CL 99  --  97*  --  99 100  CO2 30  --  28  --  28 26  GLUCOSE 40*   < > 296*  --   --  143*  BUN 24*  --  23  --   --  28*  CREATININE 0.42*  --  0.64  --   --  0.43*  CALCIUM 9.4  --  8.9  --   --  8.6*  MG 2.0  --   --   --   --   --    < > = values in this interval not displayed.   Liver Function Tests: No results for input(s): AST, ALT, ALKPHOS, BILITOT, PROT, ALBUMIN in the last 72 hours. No results for input(s): LIPASE, AMYLASE in the last 72 hours. CBC: Recent Labs    01/26/21 0611  WBC 4.6  HGB 14.0  HCT 40.3  MCV 99.0  PLT 302   Cardiac Enzymes: No results for input(s): CKTOTAL, CKMB, CKMBINDEX, TROPONINI in the last 72 hours. BNP: Invalid input(s): POCBNP D-Dimer: No results for input(s): DDIMER in the last 72 hours. Hemoglobin A1C: No results for input(s): HGBA1C in the last 72 hours. Fasting Lipid Panel: No results for input(s): CHOL, HDL, LDLCALC, TRIG, CHOLHDL, LDLDIRECT in the last 72 hours. Thyroid Function Tests: No results for input(s): TSH, T4TOTAL, T3FREE, THYROIDAB in the last 72 hours.  Invalid input(s): FREET3 Anemia Panel: No results for input(s): VITAMINB12, FOLATE, FERRITIN, TIBC, IRON, RETICCTPCT in  the last 72 hours.   PHYSICAL EXAM General: Well developed, well nourished, in no acute distress HEENT:  Normocephalic and atramatic Neck:  No JVD.  Lungs: Clear bilaterally to auscultation and percussion. Heart: HRRR . Normal S1 and S2 without gallops or murmurs.  Abdomen: Bowel sounds are positive, abdomen soft and non-tender  Msk:  Back normal, normal gait. Normal strength and tone for age. Extremities: No clubbing, cyanosis or edema.   Neuro: Alert and oriented X 3. Psych:  Good affect, responds appropriately  TELEMETRY: Sinus rhythm no evidence of SVT  ASSESSMENT AND PLAN: Remains in sinus rhythm and I reviewed the chart and the monitor and it appears there was no evidence of SVT.  Continue metoprolol.  Electrolytes appeared to be unremarkable.  EKG is also unremarkable.  Active Problems:   COPD exacerbation (HCC)   Protein-calorie malnutrition, severe    Sande Pickert A, MD, [facility] 01/26/2021 12:00 PM

## 2021-01-26 NOTE — Progress Notes (Signed)
Mobility Specialist - Progress Note   01/26/21 1537  Mobility  Activity Refused mobility  Mobility performed by Mobility specialist    Pt in session with OT at time of attempt. Will attempt session at another date/time.    Kathee Delton Mobility Specialist 01/26/21, 3:38 PM

## 2021-01-26 NOTE — Progress Notes (Addendum)
PROGRESS NOTE    Colin Novak  HAL:[id number] DOB: [birth year] DOA: 01/22/2021 PCP: Patient, No Pcp Per (Inactive)  Assessment & Plan:   Active Problems:   COPD exacerbation (HCC)   Protein-calorie malnutrition, severe   COPD exacerbation: continue on bronchodilators & encourage incentive spirometry. Completed abx course   Acute diastolic CHF exacerbation: continue on IV lasix if BP can tolerate it. Monitor I/Os and daily weights. EF 50-55%, grade II diastolic dysfunction   SVT: continue on metoprolol. Continue on tele  Elevated troponin: likely secondary to demand ischemia. Cardio is following   Hypokalemia: WNL today   DM2: likely well controlled. Continue on lantus, SSI w/ accuchecks   Depression: severity unknown. Continue on home dose of citalopram   Non-small cell lung cancer: management per onco as an outpatient   Severe protein calorie malnutrition: continue on nutritional supplements   DVT prophylaxis: lovenox  Code Status: DNR Family Communication: discussed pt's care w/ pt's daughter, [name], and answered her questions Disposition Plan: depends on PT/OT recs   Level of care: Progressive Cardiac   Status is: Inpatient  Remains inpatient appropriate because:Unsafe d/c plan, IV treatments appropriate due to intensity of illness or inability to take PO and Inpatient level of care appropriate due to severity of illness   Dispo: The patient is from: Home              Anticipated d/c is to: Home              Patient currently is medically stable to d/c.   Difficult to place patient Yes    Consultants:   Cardio   Procedures:    Antimicrobials:    Subjective: Pt c/o shortness of breath improved from day prior    Objective: Vitals:   01/25/21 1530 01/25/21 2151 01/26/21 0441 01/26/21 0802  BP: (!) 83/49 (!) 101/58 106/73 105/67  Pulse: (!) 102 90 77 89  Resp: 18 18 16 16   Temp: 97.9 F (36.6 C) 98 F (36.7 C) (!) 97.5 F (36.4 C) 97.7 F  (36.5 C)  TempSrc: Oral Oral  Oral  SpO2: 98% 97% 95% 95%  Weight:      Height:        Intake/Output Summary (Last 24 hours) at 01/26/2021 0840 Last data filed at 01/26/2021 0443 Gross per 24 hour  Intake 720 ml  Output 900 ml  Net -180 ml   Filed Weights   01/22/21 0055 01/22/21 1953 01/24/21 0339  Weight: 55.6 kg 53.9 kg 49 kg    Examination:  General exam: Appears calm and comfortable  Respiratory system: Clear to auscultation. Respiratory effort normal. Cardiovascular system: S1 & S2 +. No rubs, gallops or clicks. Gastrointestinal system: Abdomen is nondistended, soft and nontender.  Normal bowel sounds heard. Central nervous system: Alert and oriented. Moves all extremities  Psychiatry: Judgement and insight appear normal. Flat mood and affect     Data Reviewed: I have personally reviewed following labs and imaging studies  CBC: Recent Labs  Lab 01/22/21 0104 01/22/21 0423 01/23/21 0455  WBC 5.2 6.5 6.5  NEUTROABS 3.4  --   --   HGB 11.5* 11.9* 11.2*  HCT 34.7* 34.6* 34.3*  MCV 102.1* 100.0 103.6*  PLT 236 225 735   Basic Metabolic Panel: Recent Labs  Lab 01/22/21 0423 01/23/21 0455 01/23/21 2111 01/24/21 0557 01/24/21 1449 01/25/21 0555 01/25/21 1156 01/25/21 1534 01/26/21 0611  NA 140 139  --  142  --  135  --  135  133*  K 3.5 3.9  --  2.7*  --  4.3 3.9 4.1 3.9  CL 103 106  --  99  --  97*  --  99 100  CO2 28 26  --  30  --  28  --  28 26  GLUCOSE 285* 133* 466* 40* 451* 296*  --   --  143*  BUN 13 21  --  24*  --  23  --   --  28*  CREATININE 0.46* 0.40*  --  0.42*  --  0.64  --   --  0.43*  CALCIUM 8.7* 8.8*  --  9.4  --  8.9  --   --  8.6*  MG  --   --   --  2.0  --   --   --   --   --    GFR: Estimated Creatinine Clearance: 55.3 mL/min (A) (by C-G formula based on SCr of 0.43 mg/dL (L)). Liver Function Tests: No results for input(s): AST, ALT, ALKPHOS, BILITOT, PROT, ALBUMIN in the last 168 hours. No results for input(s): LIPASE, AMYLASE  in the last 168 hours. No results for input(s): AMMONIA in the last 168 hours. Coagulation Profile: Recent Labs  Lab 01/22/21 0550  INR 1.0   Cardiac Enzymes: No results for input(s): CKTOTAL, CKMB, CKMBINDEX, TROPONINI in the last 168 hours. BNP (last 3 results) No results for input(s): PROBNP in the last 8760 hours. HbA1C: No results for input(s): HGBA1C in the last 72 hours. CBG: Recent Labs  Lab 01/25/21 0809 01/25/21 1208 01/25/21 1617 01/25/21 2118 01/26/21 0802  GLUCAP 256* 177* 91 362* 155*   Lipid Profile: No results for input(s): CHOL, HDL, LDLCALC, TRIG, CHOLHDL, LDLDIRECT in the last 72 hours. Thyroid Function Tests: No results for input(s): TSH, T4TOTAL, FREET4, T3FREE, THYROIDAB in the last 72 hours. Anemia Panel: No results for input(s): VITAMINB12, FOLATE, FERRITIN, TIBC, IRON, RETICCTPCT in the last 72 hours. Sepsis Labs: No results for input(s): PROCALCITON, LATICACIDVEN in the last 168 hours.  Recent Results (from the past 240 hour(s))  Resp Panel by RT-PCR (Flu A&B, Covid) Nasopharyngeal Swab     Status: None   Collection Time: 01/22/21  1:05 AM   Specimen: Nasopharyngeal Swab; Nasopharyngeal(NP) swabs in vial transport medium  Result Value Ref Range Status   SARS Coronavirus 2 by RT PCR NEGATIVE NEGATIVE Final    Comment: (NOTE) SARS-CoV-2 target nucleic acids are NOT DETECTED.  The SARS-CoV-2 RNA is generally detectable in upper respiratory specimens during the acute phase of infection. The lowest concentration of SARS-CoV-2 viral copies this assay can detect is 138 copies/mL. A negative result does not preclude SARS-Cov-2 infection and should not be used as the sole basis for treatment or other patient management decisions. A negative result may occur with  improper specimen collection/handling, submission of specimen other than nasopharyngeal swab, presence of viral mutation(s) within the areas targeted by this assay, and inadequate number of  viral copies(<138 copies/mL). A negative result must be combined with clinical observations, patient history, and epidemiological information. The expected result is Negative.  Fact Sheet for Patients:  EntrepreneurPulse.com.au  Fact Sheet for Healthcare Providers:  IncredibleEmployment.be  This test is no t yet approved or cleared by the [place] FDA and  has been authorized for detection and/or diagnosis of SARS-CoV-2 by FDA under an Emergency Use Authorization (EUA). This EUA will remain  in effect (meaning this test can be used) for the duration of the COVID-19 declaration  under Section 564(b)(1) of the Act, 21 U.S.C.section 360bbb-3(b)(1), unless the authorization is terminated  or revoked sooner.       Influenza A by PCR NEGATIVE NEGATIVE Final   Influenza B by PCR NEGATIVE NEGATIVE Final    Comment: (NOTE) The Xpert Xpress SARS-CoV-2/FLU/RSV plus assay is intended as an aid in the diagnosis of influenza from Nasopharyngeal swab specimens and should not be used as a sole basis for treatment. Nasal washings and aspirates are unacceptable for Xpert Xpress SARS-CoV-2/FLU/RSV testing.  Fact Sheet for Patients: EntrepreneurPulse.com.au  Fact Sheet for Healthcare Providers: IncredibleEmployment.be  This test is not yet approved or cleared by the [place] FDA and has been authorized for detection and/or diagnosis of SARS-CoV-2 by FDA under an Emergency Use Authorization (EUA). This EUA will remain in effect (meaning this test can be used) for the duration of the COVID-19 declaration under Section 564(b)(1) of the Act, 21 U.S.C. section 360bbb-3(b)(1), unless the authorization is terminated or revoked.  Performed at [facility], [address] [place], [place] [address]   Culture, blood (Routine X 2) w Reflex to ID Panel     Status: Abnormal   Collection Time: 01/22/21  4:23 AM    Specimen: BLOOD  Result Value Ref Range Status   Specimen Description   Final    BLOOD LEFT [facility] Performed at [facility], [address], [place], [place] [address]    Special Requests   Final    BOTTLES DRAWN AEROBIC AND ANAEROBIC Blood Culture adequate volume Performed at [facility], [address] [place], [place] [address]    Culture  Setup Time   Final    GRAM POSITIVE COCCI ANAEROBIC BOTTLE ONLY Organism ID to follow CRITICAL RESULT CALLED TO, READ BACK BY AND VERIFIED WITHVioleta Gelinas [facility] 2836 01/23/21 HNM Performed at [facility], [address] [place], [place] [address]    Culture (A)  Final    STAPHYLOCOCCUS EPIDERMIDIS THE SIGNIFICANCE OF ISOLATING THIS ORGANISM FROM A SINGLE SET OF BLOOD CULTURES WHEN MULTIPLE SETS ARE DRAWN IS UNCERTAIN. PLEASE NOTIFY THE MICROBIOLOGY DEPARTMENT WITHIN ONE WEEK IF SPECIATION AND SENSITIVITIES ARE REQUIRED. Performed at [facility], [place] [address], [place], [place] [address]    Report Status 01/25/2021 FINAL  Final  Blood Culture ID Panel (Reflexed)     Status: Abnormal   Collection Time: 01/22/21  4:23 AM  Result Value Ref Range Status   Enterococcus faecalis NOT DETECTED NOT DETECTED Final   Enterococcus Faecium NOT DETECTED NOT DETECTED Final   Listeria monocytogenes NOT DETECTED NOT DETECTED Final   Staphylococcus species DETECTED (A) NOT DETECTED Final    Comment: CRITICAL RESULT CALLED TO, READ BACK BY AND VERIFIED WITH: JASON ROBBINS PHARMD 0226 01/23/21 HNM    Staphylococcus aureus (BCID) NOT DETECTED NOT DETECTED Final   Staphylococcus epidermidis DETECTED (A) NOT DETECTED Final    Comment: CRITICAL RESULT CALLED TO, READ BACK BY AND VERIFIED WITH: JASON ROBBINS PHARMD 0226 01/23/21 HNM    Staphylococcus lugdunensis NOT DETECTED NOT DETECTED Final   Streptococcus species NOT DETECTED NOT DETECTED Final   Streptococcus agalactiae NOT DETECTED NOT DETECTED Final   Streptococcus  pneumoniae NOT DETECTED NOT DETECTED Final   Streptococcus pyogenes NOT DETECTED NOT DETECTED Final   A.calcoaceticus-baumannii NOT DETECTED NOT DETECTED Final   Bacteroides fragilis NOT DETECTED NOT DETECTED Final   Enterobacterales NOT DETECTED NOT DETECTED Final   Enterobacter cloacae complex NOT DETECTED NOT DETECTED Final  Escherichia coli NOT DETECTED NOT DETECTED Final   Klebsiella aerogenes NOT DETECTED NOT DETECTED Final   Klebsiella oxytoca NOT DETECTED NOT DETECTED Final   Klebsiella pneumoniae NOT DETECTED NOT DETECTED Final   Proteus species NOT DETECTED NOT DETECTED Final   Salmonella species NOT DETECTED NOT DETECTED Final   Serratia marcescens NOT DETECTED NOT DETECTED Final   Haemophilus influenzae NOT DETECTED NOT DETECTED Final   Neisseria meningitidis NOT DETECTED NOT DETECTED Final   Pseudomonas aeruginosa NOT DETECTED NOT DETECTED Final   Stenotrophomonas maltophilia NOT DETECTED NOT DETECTED Final   Candida albicans NOT DETECTED NOT DETECTED Final   Candida auris NOT DETECTED NOT DETECTED Final   Candida glabrata NOT DETECTED NOT DETECTED Final   Candida krusei NOT DETECTED NOT DETECTED Final   Candida parapsilosis NOT DETECTED NOT DETECTED Final   Candida tropicalis NOT DETECTED NOT DETECTED Final   Cryptococcus neoformans/gattii NOT DETECTED NOT DETECTED Final   Methicillin resistance mecA/C NOT DETECTED NOT DETECTED Final    Comment: Performed at [facility], [address] [place], [place] [address]  Culture, blood (Routine X 2) w Reflex to ID Panel     Status: None (Preliminary result)   Collection Time: 01/22/21  4:24 AM   Specimen: BLOOD  Result Value Ref Range Status   Specimen Description BLOOD RIGHT FA  Final   Special Requests   Final    BOTTLES DRAWN AEROBIC AND ANAEROBIC Blood Culture adequate volume   Culture   Final    NO GROWTH 4 DAYS Performed at [facility], [address] [place], [place] [address]    Report  Status PENDING  Incomplete  CULTURE, BLOOD (ROUTINE X 2) w Reflex to ID Panel     Status: None (Preliminary result)   Collection Time: 01/25/21  8:41 AM   Specimen: BLOOD  Result Value Ref Range Status   Specimen Description BLOOD BLOOD RIGHT HAND  Final   Special Requests   Final    BOTTLES DRAWN AEROBIC AND ANAEROBIC Blood Culture adequate volume   Culture   Final    NO GROWTH < 24 HOURS Performed at [facility], [address] [place], [place] [address]    Report Status PENDING  Incomplete  CULTURE, BLOOD (ROUTINE X 2) w Reflex to ID Panel     Status: None (Preliminary result)   Collection Time: 01/25/21  8:41 AM   Specimen: BLOOD  Result Value Ref Range Status   Specimen Description BLOOD LEFT ANTECUBITAL  Final   Special Requests   Final    BOTTLES DRAWN AEROBIC AND ANAEROBIC Blood Culture adequate volume   Culture   Final    NO GROWTH < 24 HOURS Performed at [facility], [address], [place], [place] [address]    Report Status PENDING  Incomplete         Radiology Studies: No results found.      Scheduled Meds: . aspirin EC  81 mg Oral Daily  . atorvastatin  80 mg Oral Daily  . azithromycin  500 mg Oral Daily  . budesonide (PULMICORT) nebulizer solution  0.25 mg Nebulization BID  . citalopram  20 mg Oral Daily  . enoxaparin (LOVENOX) injection  40 mg Subcutaneous Q24H  . famotidine  20 mg Oral Daily  . feeding supplement  237 mL Oral TID BM  . fludrocortisone  100 mcg Oral Daily  . guaiFENesin  600 mg Oral BID  . insulin aspart  0-5 Units Subcutaneous QHS  .  insulin aspart  0-9 Units Subcutaneous TID WC  . insulin aspart  3 Units Subcutaneous TID WC  . insulin glargine  12 Units Subcutaneous Daily  . megestrol  400 mg Oral Daily  . metoprolol succinate  25 mg Oral Daily  . midodrine  5 mg Oral TID WC  . multivitamin with minerals  1 tablet Oral Daily   Continuous Infusions:   LOS: 4 days    Time spent: 31 mins      Wyvonnia Dusky, MD [facility] Pager [phone]  If 7PM-7AM, please contact night-coverage 01/26/2021, 8:40 AM

## 2021-01-27 DIAGNOSIS — I5033 Acute on chronic diastolic (congestive) heart failure: Secondary | ICD-10-CM

## 2021-01-27 LAB — BASIC METABOLIC PANEL
Anion gap: 7 (ref 5–15)
BUN: 31 mg/dL — ABNORMAL HIGH (ref 8–23)
CO2: 27 mmol/L (ref 22–32)
Calcium: 8.6 mg/dL — ABNORMAL LOW (ref 8.9–10.3)
Chloride: 104 mmol/L (ref 98–111)
Creatinine, Ser: 0.44 mg/dL — ABNORMAL LOW (ref 0.61–1.24)
GFR, Estimated: >60 mL/min (ref >60)
Glucose, Bld: 111 mg/dL — ABNORMAL HIGH (ref 70–99)
Potassium: 3.7 mmol/L (ref 3.5–5.1)
Sodium: 138 mmol/L (ref 135–145)

## 2021-01-27 LAB — CBC
HCT: 41.7 % (ref 39.0–52.0)
Hemoglobin: 14.2 g/dL (ref 13.0–17.0)
MCH: 34.1 pg — ABNORMAL HIGH (ref 26.0–34.0)
MCHC: 34.1 g/dL (ref 30.0–36.0)
MCV: 100 fL (ref 80.0–100.0)
Platelets: 308 10*3/uL (ref 150–400)
RBC: 4.17 MIL/uL — ABNORMAL LOW (ref 4.22–5.81)
RDW: 14 % (ref 11.5–15.5)
WBC: 5.4 10*3/uL (ref 4.0–10.5)
nRBC: 0 % (ref 0.0–0.2)

## 2021-01-27 LAB — CULTURE, BLOOD (ROUTINE X 2)
Culture: NO GROWTH
Special Requests: ADEQUATE

## 2021-01-27 LAB — GLUCOSE, CAPILLARY
Glucose-Capillary: 144 mg/dL — ABNORMAL HIGH (ref 70–99)
Glucose-Capillary: 137 mg/dL — ABNORMAL HIGH (ref 70–99)

## 2021-01-27 MED ORDER — ATORVASTATIN CALCIUM 80 MG PO TABS: 80.0000 mg | ORAL_TABLET | Freq: Every day | ORAL | 0 refills | Status: AC

## 2021-01-27 MED ORDER — METOPROLOL SUCCINATE ER 25 MG PO TB24: 12.5000 mg | ORAL_TABLET | Freq: Every day | ORAL | 0 refills | Status: AC

## 2021-01-27 MED ORDER — MIDODRINE HCL 5 MG PO TABS: 5.0000 mg | ORAL_TABLET | Freq: Three times a day (TID) | ORAL | 0 refills | Status: AC

## 2021-01-27 MED ORDER — FUROSEMIDE 20 MG PO TABS: 20.0000 mg | ORAL_TABLET | Freq: Every day | ORAL | 0 refills | Status: AC

## 2021-01-27 NOTE — Progress Notes (Signed)
Discharge instructions explained to pt and daughter, Teresa/verbaized understanding. IV and tele removed. Will transport off unit via wheelchair.

## 2021-01-27 NOTE — Discharge Summary (Signed)
Physician Discharge Summary  OTTIE TILLERY PFX:[id number] DOB: 10-06-1946 DOA: 01/22/2021  PCP: Patient, No Pcp Per (Inactive)  Admit date: 01/22/2021 Discharge date: 01/27/2021  Admitted From: home  Disposition:  Home w/ hospice   Recommendations for Outpatient Follow-up:  1. Follow up with hospice provider ASAP   Home Health: no  Equipment/Devices:  Discharge Condition: stable  CODE STATUS: DNR  Diet recommendation: Heart Healthy / Carb Modified (Dysphagia III diet)  Brief/Interim Summary: HPI was taken from Dr. Sidney Ace: Precious Reel is a 75 y.o. cachectic Caucasian male with medical history significant for COPD, type diabetes mellitus, GERD, dyslipidemia and anxiety as well as lung cancer, presented to the emergency room with a [place] of worsening dyspnea over the last couple of days with associated cough productive of clear whitish sputum as well as wheezing.  He denies any fever or chills.  No chest pain or palpitations.  No nausea or vomiting or abdominal pain.  No dysuria, oliguria or hematuria or flank pain.  Pulsoxymeter is 90% on room air.  ED Course: When the patient came to the ER, vital signs were within normal.  Labs revealed hypokalemia of 3.4 and blood glucose was 183 with a calcium of 8.8.  BNP was 293.8 and high-sensitivity 0.9 was 37 and later 95.  CBC showed anemia with hemoglobin of 11.5 hematocrit 34.7.  Influenza antigens and COVID-19 PCR came back negative.   EKG as reviewed by me : Showed normal sinus rhythm with rate of 94 with Q waves anteroseptally with T wave inversion. Imaging: Chest x-ray showed stable changes in the medial paramediastinal region left apex medially consistent with history of lung carcinoma.  It showed stable changes of COPD.  The patient was given numbers albuterol, as well as Atrovent and to DuoNeb.  With IV Solu-Medrol by EMS earlier.  He will be admitted to a progressive unit bed for further evaluation and management.  Hospital course  from Dr. Jimmye Norman 4/2-01/27/21: Pt was found to have COPD & CHF exacerbation. Pt was treated w/ abx, bronchodilators, IV lasix, incentive spirometry. Pt initially required supplemental oxygen which was able to be weaned off prior to d/c. Of note, pt was evidently on hospice at home and will return to hospice at home at d/c. Pt will not need to f/u w/ cardio. D/c instructions/orders were placed prior to knowing that pt was previously on hospice at home. For more information, please see previous progress/consult notes.  Discharge Diagnoses:  Active Problems:   COPD exacerbation (HCC)   Protein-calorie malnutrition, severe  COPD exacerbation: continue on bronchodilators & encourage incentive spirometry. Completed abx course   Acute diastolic CHF exacerbation: continue on IV lasix if BP can tolerate it. Monitor I/Os and daily weights. EF 50-55%, grade II diastolic dysfunction   SVT: continue on metoprolol. Continue on tele  Elevated troponin: likely secondary to demand ischemia. Cardio is following   Hypokalemia: WNL today   DM2: likely well controlled. Continue on lantus, SSI w/ accuchecks   Depression: severity unknown. Continue on home dose of citalopram   Non-small cell lung cancer: management per onco as an outpatient   Severe protein calorie malnutrition: continue on nutritional supplements   Discharge Instructions  Discharge Instructions    Diet - low sodium heart healthy   Complete by: As directed    Dysphagia III diet   Diet Carb Modified   Complete by: As directed    Dysphagia III diet   Discharge instructions   Complete by: As directed  F/u w/ PCP in 1-2 weeks. F/u w/ cardio, in 1 week   Increase activity slowly   Complete by: As directed      Allergies as of 01/27/2021      Reactions   Penicillins Rash      Medication List    STOP taking these medications   megestrol 400 MG/10ML suspension Commonly known as: MEGACE     TAKE these medications    acetaminophen 500 MG tablet Commonly known as: TYLENOL Take 1,000 mg by mouth every 12 (twelve) hours.   albuterol 108 (90 Base) MCG/ACT inhaler Commonly known as: ProAir HFA Inhale 1-2 puffs into the lungs every 6 (six) hours as needed for wheezing or shortness of breath (use as needed for chest congestion.).   aspirin EC 81 MG tablet Take 81 mg by mouth daily.   atorvastatin 80 MG tablet Commonly known as: LIPITOR Take 1 tablet (80 mg total) by mouth daily. Start taking on: January 28, 2021   citalopram 20 MG tablet Commonly known as: CELEXA Take 1 tablet (20 mg total) by mouth daily.   FAMOTIDINE MAXIMUM STRENGTH PO Take 20 mg by mouth daily.   fludrocortisone 0.1 MG tablet Commonly known as: FLORINEF Take 100 mcg by mouth daily.   furosemide 20 MG tablet Commonly known as: Lasix Take 1 tablet (20 mg total) by mouth daily.   glucose blood test strip Commonly known as: ONE TOUCH ULTRA TEST 1 each by Other route 2 (two) times daily as needed for other. Dx: E11.9   HumaLOG KwikPen 100 UNIT/ML KwikPen Generic drug: insulin lispro Inject 14 Units into the skin as needed. Sliding scale up to 12 units   Lantus SoloStar 100 UNIT/ML Solostar Pen Generic drug: insulin glargine INJECT 10-20 UNITS INTO THE SKIN DAILY What changed:   how much to take  when to take this   loperamide 2 MG capsule Commonly known as: IMODIUM Take 1 capsule (2 mg total) by mouth See admin instructions. Initial: 4 mg, followed by 2 mg after each loose stool; maximum: 16 mg/day   metFORMIN 500 MG tablet Commonly known as: GLUCOPHAGE TAKE ONE TABLET BY MOUTH TWICE DAILY What changed:   how much to take  when to take this   metoprolol succinate 25 MG 24 hr tablet Commonly known as: TOPROL-XL Take 0.5 tablets (12.5 mg total) by mouth daily. Start taking on: January 28, 2021   midodrine 5 MG tablet Commonly known as: PROAMATINE Take 1 tablet (5 mg total) by mouth 3 (three) times daily with  meals.   morphine CONCENTRATE 10 mg / 0.5 ml concentrated solution Take 0.5 mLs (10 mg total) by mouth every 6 (six) hours as needed for moderate pain or severe pain.   Mucinex Fast-Max 10-650-400 MG/20ML Liqd Generic drug: Phenylephrine-APAP-guaiFENesin Take 20 mLs by mouth 2 (two) times daily as needed. Takes mainly at bedtime, will take occasionally during the day due to coughing   multivitamin tablet Take 1 tablet by mouth daily.   NovoFine 32G X 6 MM Misc Generic drug: Insulin Pen Needle USE AS DIRECTED. 5 INJECTIONS A DAY   ROBITUSSIN DM PO Take 5 mLs by mouth at bedtime.       Allergies  Allergen Reactions  . Penicillins Rash    Consultations:  Cardio    Procedures/Studies: CT Head Wo Contrast  Result Date: 01/18/2021 CLINICAL DATA:  Syncope, fell, facial laceration, history of lung cancer EXAM: CT HEAD WITHOUT CONTRAST TECHNIQUE: Contiguous axial images were obtained from the  base of the skull through the vertex without intravenous contrast. COMPARISON:  08/13/2020 FINDINGS: Brain: Chronic ischemic changes are seen throughout the bilateral periventricular white matter, right frontal subcortical white matter, right occipital cortex. No signs of acute infarct or hemorrhage. Lateral ventricles and midline structures are stable. No acute extra-axial fluid collections. No evidence of mass effect or intracranial metastases on this unenhanced exam. Vascular: No hyperdense vessel or unexpected calcification. Skull: Normal. Negative for fracture or focal lesion. Sinuses/Orbits: No acute finding. Other: None. IMPRESSION: 1. Stable chronic ischemic changes as above. No acute intracranial process. Electronically Signed   By: Randa Ngo M.D.   On: 01/18/2021 16:35   CT Cervical Spine Wo Contrast  Result Date: 01/18/2021 CLINICAL DATA:  [place], facial laceration, syncope EXAM: CT CERVICAL SPINE WITHOUT CONTRAST TECHNIQUE: Multidetector CT imaging of the cervical spine was performed  without intravenous contrast. Multiplanar CT image reconstructions were also generated. COMPARISON:  None. FINDINGS: Alignment: Alignment is grossly anatomic. Skull base and vertebrae: No acute fracture. No primary bone lesion or focal pathologic process. Soft tissues and spinal canal: No prevertebral fluid or swelling. No visible canal hematoma. Disc levels: Prominent spondylosis at C3-4 and C4-5. Facet hypertrophic changes are most pronounced from C2 through C5. Left predominant neural foraminal encroachment at C3-4 and C4-5. Upper chest: Airway is patent. Partial visualization of the known nodularity within the left upper lobe compatible with patient's known lung cancer. Extensive emphysema. Other: Reconstructed images demonstrate no additional findings. IMPRESSION: 1. No acute cervical spine fracture. 2. Multilevel spondylosis and facet hypertrophy, most pronounced at C3-4 and C4-5. 3. Partial visualization of the patient's known left upper lobe lung cancer. Electronically Signed   By: Randa Ngo M.D.   On: 01/18/2021 16:39   DG Chest Portable 1 View  Result Date: 01/22/2021 CLINICAL DATA:  Shortness of breath EXAM: PORTABLE CHEST 1 VIEW COMPARISON:  09/27/2020 FINDINGS: Cardiac shadow is within normal limits. Aortic calcifications are again seen. Patient rotation to the right accentuates the mediastinal markings but stable. This is similar to fibrotic changes seen on prior CT. Persistent density is noted in the medial aspect of the left apex similar to that seen on prior CT exam. Lungs are hyperinflated bilaterally. No bony abnormality noted. IMPRESSION: Stable changes in the medial paramediastinal region and left apex medially consistent with the given clinical history lung carcinoma. Stable changes of COPD. Electronically Signed   By: Inez Catalina M.D.   On: 01/22/2021 01:28   ECHOCARDIOGRAM COMPLETE  Result Date: 01/23/2021    ECHOCARDIOGRAM REPORT   Patient Name:   UNIQUE SEARFOSS Date of Exam:  01/23/2021 Medical Rec #:  [id number]      Height:       68.0 in Accession #:    [id number]     Weight:       118.9 lb Date of Birth:  [birth year]      BSA:          1.638 m Patient Age:    75 years       BP:           91/56 mmHg Patient Gender: M              HR:           83 bpm. Exam Location:  [facility] Procedure: 2D Echo, Color Doppler and Cardiac Doppler Indications:     I50.31 CHF-Acute Diastolic  History:         Patient has no prior history of  Echocardiogram examinations.                  COPD; Risk Factors:Current Smoker, Dyslipidemia and Diabetes.  Sonographer:     Charmayne Sheer RDCS (AE) Referring Phys:  [id number] [place] Diagnosing Phys: Neoma Laming MD  Sonographer Comments: Suboptimal apical window. Image acquisition challenging due to COPD and Image acquisition challenging due to patient body habitus. IMPRESSIONS  1. Left ventricular ejection fraction, by estimation, is 50 to 55%. The left ventricle has low normal function. The left ventricle demonstrates global hypokinesis. There is moderate concentric left ventricular hypertrophy. Left ventricular diastolic parameters are consistent with Grade II diastolic dysfunction (pseudonormalization).  2. Right ventricular systolic function is normal. The right ventricular size is normal.  3. Left atrial size was moderately dilated.  4. Right atrial size was moderately dilated.  5. The mitral valve is normal in structure. Mild mitral valve regurgitation. No evidence of mitral stenosis.  6. Tricuspid valve regurgitation is mild to moderate.  7. The aortic valve is normal in structure. Aortic valve regurgitation is mild. Mild aortic valve sclerosis is present, with no evidence of aortic valve stenosis.  8. The inferior vena cava is normal in size with greater than 50% respiratory variability, suggesting right atrial pressure of 3 mmHg. FINDINGS  Left Ventricle: Left ventricular ejection fraction, by estimation, is 50 to 55%. The left ventricle has low normal function.  The left ventricle demonstrates global hypokinesis. The left ventricular internal cavity size was normal in size. There is moderate concentric left ventricular hypertrophy. Left ventricular diastolic parameters are consistent with Grade II diastolic dysfunction (pseudonormalization). Right Ventricle: The right ventricular size is normal. No increase in right ventricular wall thickness. Right ventricular systolic function is normal. Left Atrium: Left atrial size was moderately dilated. Right Atrium: Right atrial size was moderately dilated. Pericardium: There is no evidence of pericardial effusion. Mitral Valve: The mitral valve is normal in structure. Mild mitral valve regurgitation. No evidence of mitral valve stenosis. MV peak gradient, 1.7 mmHg. The mean mitral valve gradient is 1.0 mmHg. Tricuspid Valve: The tricuspid valve is normal in structure. Tricuspid valve regurgitation is mild to moderate. No evidence of tricuspid stenosis. Aortic Valve: The aortic valve is normal in structure. Aortic valve regurgitation is mild. Mild aortic valve sclerosis is present, with no evidence of aortic valve stenosis. Aortic valve mean gradient measures 2.0 mmHg. Aortic valve peak gradient measures 3.2 mmHg. Aortic valve area, by VTI measures 2.54 cm. Pulmonic Valve: The pulmonic valve was normal in structure. Pulmonic valve regurgitation is not visualized. No evidence of pulmonic stenosis. Aorta: The aortic root is normal in size and structure. Venous: The inferior vena cava is normal in size with greater than 50% respiratory variability, suggesting right atrial pressure of 3 mmHg. IAS/Shunts: No atrial level shunt detected by color flow Doppler.  LEFT VENTRICLE PLAX 2D LVIDd:         3.00 cm  Diastology LVIDs:         2.20 cm  LV e' medial:    6.31 cm/s LV PW:         1.00 cm  LV E/e' medial:  9.8 LV IVS:        0.70 cm  LV e' lateral:   7.83 cm/s LVOT diam:     2.20 cm  LV E/e' lateral: 7.9 LV SV:         38 LV SV Index:    23 LVOT Area:     3.80  cm  RIGHT VENTRICLE RV Basal diam:  3.80 cm TAPSE (M-mode): 2.5 cm LEFT ATRIUM           Index LA diam:      2.80 cm 1.71 cm/m LA Vol (A4C): 62.9 ml 38.39 ml/m  AORTIC VALVE                   PULMONIC VALVE AV Area (Vmax):    2.73 cm    PV Vmax:       0.69 m/s AV Area (Vmean):   2.81 cm    PV Vmean:      47.800 cm/s AV Area (VTI):     2.54 cm    PV VTI:        0.131 m AV Vmax:           90.00 cm/s  PV Peak grad:  1.9 mmHg AV Vmean:          56.700 cm/s PV Mean grad:  1.0 mmHg AV VTI:            0.151 m AV Peak Grad:      3.2 mmHg AV Mean Grad:      2.0 mmHg LVOT Vmax:         64.60 cm/s LVOT Vmean:        41.900 cm/s LVOT VTI:          0.101 m LVOT/AV VTI ratio: 0.67  AORTA Ao Root diam: 4.90 cm MITRAL VALVE               TRICUSPID VALVE MV Area (PHT): 5.38 cm    TR Peak grad:   33.4 mmHg MV Area VTI:   2.91 cm    TR Vmax:        289.00 cm/s MV Peak grad:  1.7 mmHg MV Mean grad:  1.0 mmHg    SHUNTS MV Vmax:       0.65 m/s    Systemic VTI:  0.10 m MV Vmean:      45.0 cm/s   Systemic Diam: 2.20 cm MV Decel Time: 141 msec MV E velocity: 62.10 cm/s MV A velocity: 46.30 cm/s MV E/A ratio:  1.34 Neoma Laming MD Electronically signed by Neoma Laming MD Signature Date/Time: 01/23/2021/4:10:12 PM    Final       Subjective: Pt c/o fatigue   Discharge Exam: Vitals:   01/27/21 0819 01/27/21 1100  BP: 100/66   Pulse: 79 (!) 110  Resp: 16   Temp: 97.6 F (36.4 C)   SpO2: 95% (!) 88%   Vitals:   01/26/21 2025 01/27/21 0343 01/27/21 0819 01/27/21 1100  BP: 96/67 93/67 100/66   Pulse: (!) 109 90 79 (!) 110  Resp: 20 18 16    Temp:  98.1 F (36.7 C) 97.6 F (36.4 C)   TempSrc:  Oral Oral   SpO2: 98% 94% 95% (!) 88%  Weight:      Height:        General: Pt is alert, awake, not in acute distress Cardiovascular: S1/S2 +, no rubs, no gallops Respiratory: diminished breath sounds b/l  Abdominal: Soft, NT, ND, bowel sounds + Extremities: no edema, no cyanosis    The  results of significant diagnostics from this hospitalization (including imaging, microbiology, ancillary and laboratory) are listed below for reference.     Microbiology: Recent Results (from the past 240 hour(s))  Resp Panel by RT-PCR (Flu A&B, Covid) Nasopharyngeal Swab     Status: None   Collection  Time: 01/22/21  1:05 AM   Specimen: Nasopharyngeal Swab; Nasopharyngeal(NP) swabs in vial transport medium  Result Value Ref Range Status   SARS Coronavirus 2 by RT PCR NEGATIVE NEGATIVE Final    Comment: (NOTE) SARS-CoV-2 target nucleic acids are NOT DETECTED.  The SARS-CoV-2 RNA is generally detectable in upper respiratory specimens during the acute phase of infection. The lowest concentration of SARS-CoV-2 viral copies this assay can detect is 138 copies/mL. A negative result does not preclude SARS-Cov-2 infection and should not be used as the sole basis for treatment or other patient management decisions. A negative result may occur with  improper specimen collection/handling, submission of specimen other than nasopharyngeal swab, presence of viral mutation(s) within the areas targeted by this assay, and inadequate number of viral copies(<138 copies/mL). A negative result must be combined with clinical observations, patient history, and epidemiological information. The expected result is Negative.  Fact Sheet for Patients:  EntrepreneurPulse.com.au  Fact Sheet for Healthcare Providers:  IncredibleEmployment.be  This test is no t yet approved or cleared by the [place] FDA and  has been authorized for detection and/or diagnosis of SARS-CoV-2 by FDA under an Emergency Use Authorization (EUA). This EUA will remain  in effect (meaning this test can be used) for the duration of the COVID-19 declaration under Section 564(b)(1) of the Act, 21 U.S.C.section 360bbb-3(b)(1), unless the authorization is terminated  or revoked sooner.        Influenza A by PCR NEGATIVE NEGATIVE Final   Influenza B by PCR NEGATIVE NEGATIVE Final    Comment: (NOTE) The Xpert Xpress SARS-CoV-2/FLU/RSV plus assay is intended as an aid in the diagnosis of influenza from Nasopharyngeal swab specimens and should not be used as a sole basis for treatment. Nasal washings and aspirates are unacceptable for Xpert Xpress SARS-CoV-2/FLU/RSV testing.  Fact Sheet for Patients: EntrepreneurPulse.com.au  Fact Sheet for Healthcare Providers: IncredibleEmployment.be  This test is not yet approved or cleared by the [place] FDA and has been authorized for detection and/or diagnosis of SARS-CoV-2 by FDA under an Emergency Use Authorization (EUA). This EUA will remain in effect (meaning this test can be used) for the duration of the COVID-19 declaration under Section 564(b)(1) of the Act, 21 U.S.C. section 360bbb-3(b)(1), unless the authorization is terminated or revoked.  Performed at [facility], [address] [place], [place] [address]   Culture, blood (Routine X 2) w Reflex to ID Panel     Status: Abnormal   Collection Time: 01/22/21  4:23 AM   Specimen: BLOOD  Result Value Ref Range Status   Specimen Description   Final    BLOOD LEFT [facility] Performed at [facility], [address] [place], [place] [address]    Special Requests   Final    BOTTLES DRAWN AEROBIC AND ANAEROBIC Blood Culture adequate volume Performed at [facility], [address] [place], [place] [address]    Culture  Setup Time   Final    GRAM POSITIVE COCCI ANAEROBIC BOTTLE ONLY Organism ID to follow CRITICAL RESULT CALLED TO, READ BACK BY AND VERIFIED WITHVioleta Gelinas [facility] 3220 01/23/21 HNM Performed at [facility], [address] [place], [place] [address]    Culture (A)  Final    STAPHYLOCOCCUS EPIDERMIDIS THE SIGNIFICANCE OF ISOLATING THIS ORGANISM FROM A SINGLE SET OF BLOOD CULTURES  WHEN MULTIPLE SETS ARE DRAWN IS UNCERTAIN. PLEASE NOTIFY THE MICROBIOLOGY DEPARTMENT WITHIN ONE WEEK IF SPECIATION AND SENSITIVITIES ARE REQUIRED. Performed at [facility], [place] [address] [place], [place] [address]    Report Status 01/25/2021 FINAL  Final  Blood Culture ID Panel (Reflexed)     Status: Abnormal   Collection Time: 01/22/21  4:23 AM  Result Value Ref Range Status   Enterococcus faecalis NOT DETECTED NOT DETECTED Final   Enterococcus Faecium NOT DETECTED NOT DETECTED Final   Listeria monocytogenes NOT DETECTED NOT DETECTED Final   Staphylococcus species DETECTED (A) NOT DETECTED Final    Comment: CRITICAL RESULT CALLED TO, READ BACK BY AND VERIFIED WITH: JASON ROBBINS PHARMD 0226 01/23/21 HNM    Staphylococcus aureus (BCID) NOT DETECTED NOT DETECTED Final   Staphylococcus epidermidis DETECTED (A) NOT DETECTED Final    Comment: CRITICAL RESULT CALLED TO, READ BACK BY AND VERIFIED WITH: JASON ROBBINS PHARMD 0226 01/23/21 HNM    Staphylococcus lugdunensis NOT DETECTED NOT DETECTED Final   Streptococcus species NOT DETECTED NOT DETECTED Final   Streptococcus agalactiae NOT DETECTED NOT DETECTED Final   Streptococcus pneumoniae NOT DETECTED NOT DETECTED Final   Streptococcus pyogenes NOT DETECTED NOT DETECTED Final   A.calcoaceticus-baumannii NOT DETECTED NOT DETECTED Final   Bacteroides fragilis NOT DETECTED NOT DETECTED Final   Enterobacterales NOT DETECTED NOT DETECTED Final   Enterobacter cloacae complex NOT DETECTED NOT DETECTED Final   Escherichia coli NOT DETECTED NOT DETECTED Final   Klebsiella aerogenes NOT DETECTED NOT DETECTED Final   Klebsiella oxytoca NOT DETECTED NOT DETECTED Final   Klebsiella pneumoniae NOT DETECTED NOT DETECTED Final   Proteus species NOT DETECTED NOT DETECTED Final   Salmonella species NOT DETECTED NOT DETECTED Final   Serratia marcescens NOT DETECTED NOT DETECTED Final   Haemophilus influenzae NOT DETECTED NOT DETECTED Final    Neisseria meningitidis NOT DETECTED NOT DETECTED Final   Pseudomonas aeruginosa NOT DETECTED NOT DETECTED Final   Stenotrophomonas maltophilia NOT DETECTED NOT DETECTED Final   Candida albicans NOT DETECTED NOT DETECTED Final   Candida auris NOT DETECTED NOT DETECTED Final   Candida glabrata NOT DETECTED NOT DETECTED Final   Candida krusei NOT DETECTED NOT DETECTED Final   Candida parapsilosis NOT DETECTED NOT DETECTED Final   Candida tropicalis NOT DETECTED NOT DETECTED Final   Cryptococcus neoformans/gattii NOT DETECTED NOT DETECTED Final   Methicillin resistance mecA/C NOT DETECTED NOT DETECTED Final    Comment: Performed at [facility], [address] [place], [place] [address]  Culture, blood (Routine X 2) w Reflex to ID Panel     Status: None   Collection Time: 01/22/21  4:24 AM   Specimen: BLOOD  Result Value Ref Range Status   Specimen Description BLOOD RIGHT FA  Final   Special Requests   Final    BOTTLES DRAWN AEROBIC AND ANAEROBIC Blood Culture adequate volume   Culture   Final    NO GROWTH 5 DAYS Performed at [facility], [address] [place], [place] [address]    Report Status 01/27/2021 FINAL  Final  CULTURE, BLOOD (ROUTINE X 2) w Reflex to ID Panel     Status: None (Preliminary result)   Collection Time: 01/25/21  8:41 AM   Specimen: BLOOD  Result Value Ref Range Status   Specimen Description BLOOD BLOOD RIGHT HAND  Final   Special Requests   Final    BOTTLES DRAWN AEROBIC AND ANAEROBIC Blood Culture adequate volume   Culture   Final    NO GROWTH 2 DAYS Performed at [facility], [address] [place],  [address]  Report Status PENDING  Incomplete  CULTURE, BLOOD (ROUTINE X 2) w Reflex to ID Panel     Status: None (Preliminary result)   Collection Time: 01/25/21  8:41 AM   Specimen: BLOOD  Result Value Ref Range Status   Specimen Description BLOOD LEFT ANTECUBITAL  Final   Special Requests   Final    BOTTLES DRAWN  AEROBIC AND ANAEROBIC Blood Culture adequate volume   Culture   Final    NO GROWTH 2 DAYS Performed at [facility], [address] [place], [place] [address]    Report Status PENDING  Incomplete     Labs: BNP (last 3 results) Recent Labs    01/22/21 0104 01/22/21 0423 01/24/21 0557  BNP 293.8* 463.7* 664.4*   Basic Metabolic Panel: Recent Labs  Lab 01/23/21 0455 01/23/21 2111 01/24/21 0557 01/24/21 1449 01/25/21 0555 01/25/21 1156 01/25/21 1534 01/26/21 0611 01/27/21 0445  NA 139  --  142  --  135  --  135 133* 138  K 3.9  --  2.7*  --  4.3 3.9 4.1 3.9 3.7  CL 106  --  99  --  97*  --  99 100 104  CO2 26  --  30  --  28  --  28 26 27   GLUCOSE 133*   < > 40* 451* 296*  --   --  143* 111*  BUN 21  --  24*  --  23  --   --  28* 31*  CREATININE 0.40*  --  0.42*  --  0.64  --   --  0.43* 0.44*  CALCIUM 8.8*  --  9.4  --  8.9  --   --  8.6* 8.6*  MG  --   --  2.0  --   --   --   --   --   --    < > = values in this interval not displayed.   Liver Function Tests: No results for input(s): AST, ALT, ALKPHOS, BILITOT, PROT, ALBUMIN in the last 168 hours. No results for input(s): LIPASE, AMYLASE in the last 168 hours. No results for input(s): AMMONIA in the last 168 hours. CBC: Recent Labs  Lab 01/22/21 0104 01/22/21 0423 01/23/21 0455 01/26/21 0611 01/27/21 0445  WBC 5.2 6.5 6.5 4.6 5.4  NEUTROABS 3.4  --   --   --   --   HGB 11.5* 11.9* 11.2* 14.0 14.2  HCT 34.7* 34.6* 34.3* 40.3 41.7  MCV 102.1* 100.0 103.6* 99.0 100.0  PLT 236 225 239 302 308   Cardiac Enzymes: No results for input(s): CKTOTAL, CKMB, CKMBINDEX, TROPONINI in the last 168 hours. BNP: Invalid input(s): POCBNP CBG: Recent Labs  Lab 01/26/21 0802 01/26/21 1144 01/26/21 1731 01/26/21 1956 01/27/21 0821  GLUCAP 155* 285* 236* 156* 144*   D-Dimer No results for input(s): DDIMER in the last 72 hours. Hgb A1c No results for input(s): HGBA1C in the last 72 hours. Lipid  Profile No results for input(s): CHOL, HDL, LDLCALC, TRIG, CHOLHDL, LDLDIRECT in the last 72 hours. Thyroid function studies No results for input(s): TSH, T4TOTAL, T3FREE, THYROIDAB in the last 72 hours.  Invalid input(s): FREET3 Anemia work up No results for input(s): VITAMINB12, FOLATE, FERRITIN, TIBC, IRON, RETICCTPCT in the last 72 hours. Urinalysis    Component Value Date/Time   COLORURINE YELLOW (A) 09/27/2020 1235   APPEARANCEUR CLEAR (A) 09/27/2020 1235   APPEARANCEUR Clear 12/06/2018 0855   LABSPEC 1.035 (H) 09/27/2020 1235  PHURINE 6.0 09/27/2020 1235   GLUCOSEU >=500 (A) 09/27/2020 1235   HGBUR NEGATIVE 09/27/2020 1235   BILIRUBINUR NEGATIVE 09/27/2020 1235   BILIRUBINUR Negative 12/06/2018 0855   KETONESUR 20 (A) 09/27/2020 1235   PROTEINUR NEGATIVE 09/27/2020 1235   NITRITE NEGATIVE 09/27/2020 1235   LEUKOCYTESUR NEGATIVE 09/27/2020 1235   Sepsis Labs Invalid input(s): PROCALCITONIN,  WBC,  LACTICIDVEN Microbiology Recent Results (from the past 240 hour(s))  Resp Panel by RT-PCR (Flu A&B, Covid) Nasopharyngeal Swab     Status: None   Collection Time: 01/22/21  1:05 AM   Specimen: Nasopharyngeal Swab; Nasopharyngeal(NP) swabs in vial transport medium  Result Value Ref Range Status   SARS Coronavirus 2 by RT PCR NEGATIVE NEGATIVE Final    Comment: (NOTE) SARS-CoV-2 target nucleic acids are NOT DETECTED.  The SARS-CoV-2 RNA is generally detectable in upper respiratory specimens during the acute phase of infection. The lowest concentration of SARS-CoV-2 viral copies this assay can detect is 138 copies/mL. A negative result does not preclude SARS-Cov-2 infection and should not be used as the sole basis for treatment or other patient management decisions. A negative result may occur with  improper specimen collection/handling, submission of specimen other than nasopharyngeal swab, presence of viral mutation(s) within the areas targeted by this assay, and  inadequate number of viral copies(<138 copies/mL). A negative result must be combined with clinical observations, patient history, and epidemiological information. The expected result is Negative.  Fact Sheet for Patients:  EntrepreneurPulse.com.au  Fact Sheet for Healthcare Providers:  IncredibleEmployment.be  This test is no t yet approved or cleared by the [place] FDA and  has been authorized for detection and/or diagnosis of SARS-CoV-2 by FDA under an Emergency Use Authorization (EUA). This EUA will remain  in effect (meaning this test can be used) for the duration of the COVID-19 declaration under Section 564(b)(1) of the Act, 21 U.S.C.section 360bbb-3(b)(1), unless the authorization is terminated  or revoked sooner.       Influenza A by PCR NEGATIVE NEGATIVE Final   Influenza B by PCR NEGATIVE NEGATIVE Final    Comment: (NOTE) The Xpert Xpress SARS-CoV-2/FLU/RSV plus assay is intended as an aid in the diagnosis of influenza from Nasopharyngeal swab specimens and should not be used as a sole basis for treatment. Nasal washings and aspirates are unacceptable for Xpert Xpress SARS-CoV-2/FLU/RSV testing.  Fact Sheet for Patients: EntrepreneurPulse.com.au  Fact Sheet for Healthcare Providers: IncredibleEmployment.be  This test is not yet approved or cleared by the [place] FDA and has been authorized for detection and/or diagnosis of SARS-CoV-2 by FDA under an Emergency Use Authorization (EUA). This EUA will remain in effect (meaning this test can be used) for the duration of the COVID-19 declaration under Section 564(b)(1) of the Act, 21 U.S.C. section 360bbb-3(b)(1), unless the authorization is terminated or revoked.  Performed at [facility], [address] [place], [place] [address]   Culture, blood (Routine X 2) w Reflex to ID Panel     Status: Abnormal   Collection Time:  01/22/21  4:23 AM   Specimen: BLOOD  Result Value Ref Range Status   Specimen Description   Final    BLOOD LEFT [facility] Performed at [facility], [address], [place], [place] [address]    Special Requests   Final    BOTTLES DRAWN AEROBIC AND ANAEROBIC Blood Culture adequate volume Performed at [facility], [address], [place], [place] [address]    Culture  Setup Time  Final    GRAM POSITIVE COCCI ANAEROBIC BOTTLE ONLY Organism ID to follow CRITICAL RESULT CALLED TO, READ BACK BY AND VERIFIED WITHVioleta Gelinas [facility] 7564 01/23/21 HNM Performed at [facility], [address] [place], [place] [address]    Culture (A)  Final    STAPHYLOCOCCUS EPIDERMIDIS THE SIGNIFICANCE OF ISOLATING THIS ORGANISM FROM A SINGLE SET OF BLOOD CULTURES WHEN MULTIPLE SETS ARE DRAWN IS UNCERTAIN. PLEASE NOTIFY THE MICROBIOLOGY DEPARTMENT WITHIN ONE WEEK IF SPECIATION AND SENSITIVITIES ARE REQUIRED. Performed at [facility], [place] [address], [place], [place] [address]    Report Status 01/25/2021 FINAL  Final  Blood Culture ID Panel (Reflexed)     Status: Abnormal   Collection Time: 01/22/21  4:23 AM  Result Value Ref Range Status   Enterococcus faecalis NOT DETECTED NOT DETECTED Final   Enterococcus Faecium NOT DETECTED NOT DETECTED Final   Listeria monocytogenes NOT DETECTED NOT DETECTED Final   Staphylococcus species DETECTED (A) NOT DETECTED Final    Comment: CRITICAL RESULT CALLED TO, READ BACK BY AND VERIFIED WITH: JASON ROBBINS PHARMD 0226 01/23/21 HNM    Staphylococcus aureus (BCID) NOT DETECTED NOT DETECTED Final   Staphylococcus epidermidis DETECTED (A) NOT DETECTED Final    Comment: CRITICAL RESULT CALLED TO, READ BACK BY AND VERIFIED WITH: JASON ROBBINS PHARMD 0226 01/23/21 HNM    Staphylococcus lugdunensis NOT DETECTED NOT DETECTED Final   Streptococcus species NOT DETECTED NOT DETECTED Final   Streptococcus agalactiae NOT DETECTED NOT DETECTED  Final   Streptococcus pneumoniae NOT DETECTED NOT DETECTED Final   Streptococcus pyogenes NOT DETECTED NOT DETECTED Final   A.calcoaceticus-baumannii NOT DETECTED NOT DETECTED Final   Bacteroides fragilis NOT DETECTED NOT DETECTED Final   Enterobacterales NOT DETECTED NOT DETECTED Final   Enterobacter cloacae complex NOT DETECTED NOT DETECTED Final   Escherichia coli NOT DETECTED NOT DETECTED Final   Klebsiella aerogenes NOT DETECTED NOT DETECTED Final   Klebsiella oxytoca NOT DETECTED NOT DETECTED Final   Klebsiella pneumoniae NOT DETECTED NOT DETECTED Final   Proteus species NOT DETECTED NOT DETECTED Final   Salmonella species NOT DETECTED NOT DETECTED Final   Serratia marcescens NOT DETECTED NOT DETECTED Final   Haemophilus influenzae NOT DETECTED NOT DETECTED Final   Neisseria meningitidis NOT DETECTED NOT DETECTED Final   Pseudomonas aeruginosa NOT DETECTED NOT DETECTED Final   Stenotrophomonas maltophilia NOT DETECTED NOT DETECTED Final   Candida albicans NOT DETECTED NOT DETECTED Final   Candida auris NOT DETECTED NOT DETECTED Final   Candida glabrata NOT DETECTED NOT DETECTED Final   Candida krusei NOT DETECTED NOT DETECTED Final   Candida parapsilosis NOT DETECTED NOT DETECTED Final   Candida tropicalis NOT DETECTED NOT DETECTED Final   Cryptococcus neoformans/gattii NOT DETECTED NOT DETECTED Final   Methicillin resistance mecA/C NOT DETECTED NOT DETECTED Final    Comment: Performed at [facility], [address] [place], [place] [address]  Culture, blood (Routine X 2) w Reflex to ID Panel     Status: None   Collection Time: 01/22/21  4:24 AM   Specimen: BLOOD  Result Value Ref Range Status   Specimen Description BLOOD RIGHT FA  Final   Special Requests   Final    BOTTLES DRAWN AEROBIC AND ANAEROBIC Blood Culture adequate volume   Culture   Final    NO GROWTH 5 DAYS Performed at [facility], [address] [place], [place] [address]    Report  Status 01/27/2021 FINAL  Final  CULTURE, BLOOD (ROUTINE X 2) w Reflex to ID Panel     Status: None (Preliminary result)   Collection Time: 01/25/21  8:41 AM   Specimen: BLOOD  Result Value Ref Range Status   Specimen Description BLOOD BLOOD RIGHT HAND  Final   Special Requests   Final    BOTTLES DRAWN AEROBIC AND ANAEROBIC Blood Culture adequate volume   Culture   Final    NO GROWTH 2 DAYS Performed at [facility], [address] [place], [place] [address]    Report Status PENDING  Incomplete  CULTURE, BLOOD (ROUTINE X 2) w Reflex to ID Panel     Status: None (Preliminary result)   Collection Time: 01/25/21  8:41 AM   Specimen: BLOOD  Result Value Ref Range Status   Specimen Description BLOOD LEFT ANTECUBITAL  Final   Special Requests   Final    BOTTLES DRAWN AEROBIC AND ANAEROBIC Blood Culture adequate volume   Culture   Final    NO GROWTH 2 DAYS Performed at [facility], [address], [place], [place] [address]    Report Status PENDING  Incomplete     Time coordinating discharge: Over 30 minutes  SIGNED:   Wyvonnia Dusky, MD  [facility] 01/27/2021, 11:33 AM Pager   If 7PM-7AM, please contact night-coverage

## 2021-01-27 NOTE — Progress Notes (Signed)
Physical Therapy Treatment Patient Details Name: Colin Novak MRN: [id number] DOB: [birth year] Today's Date: 01/27/2021    History of Present Illness Colin Novak is a 64yoM followed by hospice at home 2/2 StgIV [name] comes to [facility] on 3/29 c progression of SOB, LEE x8-10 days. PMH: ED post syncope at home 01/18/21. stg4 LungCA in remission, COPD, GAD, DM2, HLD, GERD.  Pt lives alone at home, ADL support from Hospice, chronic DOE limiting >household AMB distances.  1 recent fall in 6 months 2/2 syncope which pt reports was related to acute BG drop.    PT Comments    Pt tolerated treatment well today and was able to improve overall activity tolerance and ambulation distance since last session. Pt required mod I for bed mobility, CGA for transfers, and min assist for short distance ambulation. RW incorporated into treatment today, with pt demonstrating slight improvements in gait deviations and energy conservation. However, pt continues to be limited with safe and independent functional mobility secondary to decreased activity tolerance, poor safety awareness, generalized weakness, and decreased standing balance. Pt required min-mod assist for stair training, demonstrating decreased foot clearance and increased assist with fatigue. Therefore, pt will benefit from physical assist to navigate steps into home at DC. SpO2 dropped to 88% after 1st gait bout, but pt was able to recover with cues for PLB. Pt will continue to benefit from skilled acute PT services to address deficits for return to baseline function. Will continue to recommend DC home with hospice services. Pt will benefit from RW to improve safety with mobility and for energy conservation.     Follow Up Recommendations  Other (comment);Supervision - Intermittent (services and equipment are management by hospice)     Equipment Recommendations  Rolling walker with 5" wheels (services and equipment are management by hospice)     Recommendations for Other Services       Precautions / Restrictions Precautions Precautions: Fall Restrictions Weight Bearing Restrictions: No Other Position/Activity Restrictions: DNR    Mobility  Bed Mobility Overal bed mobility: Modified Independent Bed Mobility: Supine to Sit     Supine to sit: Modified independent (Device/Increase time)     General bed mobility comments: Mod I to sit EOB with HOB elevated and use of BUE for support. Increased time/effort for mobility.    Transfers Overall transfer level: Needs assistance Equipment used: Rolling walker (2 wheeled) Transfers: Sit to/from Stand Sit to Stand: Min guard         General transfer comment: CGA for safety to stand from EOB and recliner with RW. Multimodal cues for sequencing and hand placement; able to follow safety cues ~50% of the time. Increased time/effort to achieve full standing.  Ambulation/Gait Ambulation/Gait assistance: Min assist Gait Distance (Feet): 86 Feet (3ft x2 with seated rest break between (~2min)) Assistive device: Rolling walker (2 wheeled)   Gait velocity: decreased   General Gait Details: Min assist for balance to ambulate x2 short distance bouts with RW. Pt continues to demonstrate narrow BOS, intermittent scissoring gait pattern, early reciprocal gait, decreased step length/foot clearance bil, decreased RW proximity/negotation, and poor safety awareness. Multimodal cues for widened BOS and RW proximity/neogitation. Required seated rest break between bouts due to fatigue and SpO2 at 88% on RA; able to recover within 86min with cues for PLB.   Stairs Stairs: Yes Stairs assistance: Min assist;Mod assist Stair Management: One rail Right Number of Stairs: 4 General stair comments: Pt provided visual demonstration of stair navigation with RW as he has  no handrails at home, however, pt unsafe to attempt stair training with RW at this time. Pt required min-mod assist to navigate 4 steps  with step to gait pattern and unilateral UE support. Decresed foot clearance and increased assist required with fatigue. Labored breathing noted after stair training.     Balance Overall balance assessment: Needs assistance Sitting-balance support: No upper extremity supported;Feet supported Sitting balance-Leahy Scale: Good Sitting balance - Comments: No LOB   Standing balance support: Single extremity supported;During functional activity Standing balance-Leahy Scale: Poor Standing balance comment: Requires min assist for dynamic standing balance in RW             High level balance activites: Direction changes High Level Balance Comments: decreased safety with turns and remains high fall risk            Cognition Arousal/Alertness: Awake/alert Behavior During Therapy: WFL for tasks assessed/performed Overall Cognitive Status: Within Functional Limits for tasks assessed                                        Exercises Other Exercises Other Exercises: Pt able to participate in bed mobility, transfers, gait, and stair training with RW. Pt required increased assist/cueing for transfers, gait, and stair training due to weakness, decreased balance, and poor safety awareness. Continues to be at increased risk of falls. Other Exercises: Pt educated regarding: PT role/POC, stair training with/without AD, safety with mobility, monitoring vitals, and PLB. He verbalized understand of all education.    General Comments General comments (skin integrity, edema, etc.): SpO2 dropped to 88% after short distance ambulation with HR at 110 bpm. After seated rest break and cues for PLB, pt able to recover SpO2 >90% with HR at 108. After second bout of gait, pt SpO2 remained at 92% with HR at 112bpm.      Pertinent Vitals/Pain Pain Assessment: No/denies pain           PT Goals (current goals can now be found in the care plan section) Acute Rehab PT Goals Patient Stated  Goal: to go home, be able to care for himself PT Goal Formulation: With patient Time For Goal Achievement: 02/06/21 Potential to Achieve Goals: Good Progress towards PT goals: Progressing toward goals    Frequency    Min 2X/week      PT Plan Current plan remains appropriate       AM-PAC PT "6 Clicks" Mobility   Outcome Measure  Help needed turning from your back to your side while in a flat bed without using bedrails?: A Little Help needed moving from lying on your back to sitting on the side of a flat bed without using bedrails?: A Little Help needed moving to and from a bed to a chair (including a wheelchair)?: A Little Help needed standing up from a chair using your arms (e.g., wheelchair or bedside chair)?: A Little Help needed to walk in hospital room?: A Lot Help needed climbing 3-5 steps with a railing? : A Lot 6 Click Score: 16    End of Session Equipment Utilized During Treatment: Gait belt Activity Tolerance: Patient limited by fatigue;No increased pain Patient left: with call bell/phone within reach;in chair Nurse Communication: Mobility status PT Visit Diagnosis: Unsteadiness on feet (R26.81);Difficulty in walking, not elsewhere classified (R26.2);Other abnormalities of gait and mobility (R26.89);Muscle weakness (generalized) (M62.81)     Time: 9678-9381 PT Time Calculation (min) (ACUTE  ONLY): 28 min  Charges:  $Therapeutic Activity: 23-37 mins                    Herminio Commons, PT, DPT 11:12 AM,01/27/21

## 2021-01-27 NOTE — TOC Progression Note (Signed)
Transition of Care [facility]) - Progression Note    Patient Details  Name: Colin Novak MRN: [id number] Date of Birth: Oct 26, 1946  [facility] CM/SW Contact  Izola Price, RN Phone Number: 01/27/2021, 11:24 AM  Clinical Narrative:  Patient to be discharged today. Active with [facility]. Spoke with weekend liaison Horris Latino at [phone]. Also spoke with daughter, Chandra Batch at [phone] (Cell). Patient has a rollator from palliative. Daughter to call main number and they will assist/send someone out for reassessment today. PT indicated patient will need assistance getting into the house. Daughter will relay this information to hospice as they may need to provide full transport home. Will touch base with her or she will call me back if she runs into issues. Simmie Davies RN CM         Expected Discharge Plan and Services           Expected Discharge Date: 01/27/21                                     Social Determinants of Health (SDOH) Interventions    Readmission Risk Interventions No flowsheet data found.

## 2021-01-30 LAB — CULTURE, BLOOD (ROUTINE X 2)
Culture: NO GROWTH
Culture: NO GROWTH
Special Requests: ADEQUATE
Special Requests: ADEQUATE

## 2021-03-27 DEATH — deceased

## 2021-09-28 IMAGING — MR MR HEAD WO/W CM
15 series · 47 of 48 positions shown · IV contrast (gadavist)
Comparison: MRI of the brain June 17, 2019.

CLINICAL DATA: Non small cell lung cancer. Staging.

EXAM:
MRI HEAD WITHOUT AND WITH CONTRAST
TECHNIQUE: Multiplanar, multiecho pulse sequences of the brain and surrounding
structures were obtained without and with intravenous contrast.
CONTRAST:  5mL GADAVIST GADOBUTROL 1 MMOL/ML IV SOLN

[Series 5: ax dwi_tracew · axial · 3.0mm · 0.60mm/px · z∈[-60,+95]mm · 4 of 48 slices shown]
[im 1/48]
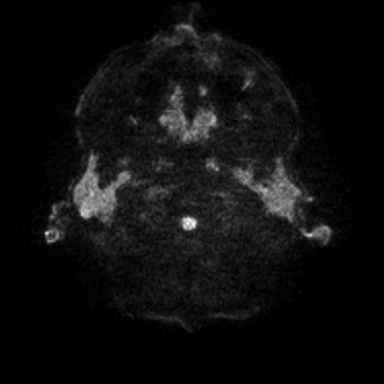
[im 16/48]
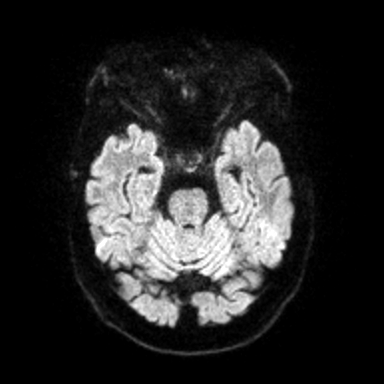
[im 32/48]
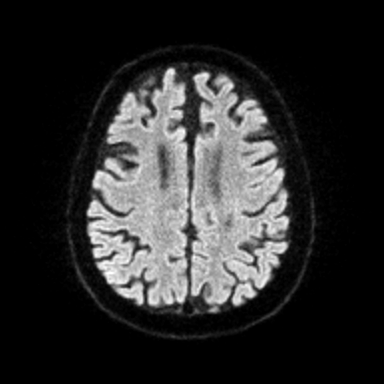
[im 48/48]
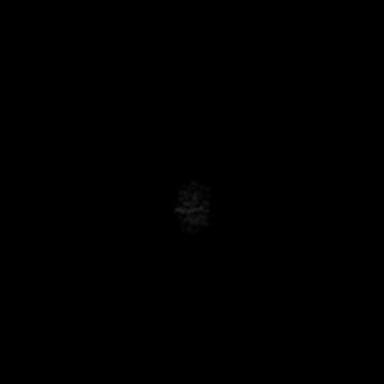

[Series 6: ax dwi_adc · axial · 3.0mm · 0.60mm/px · z∈[-60,+95]mm · 3 of 48 slices shown]
[im 1/48]
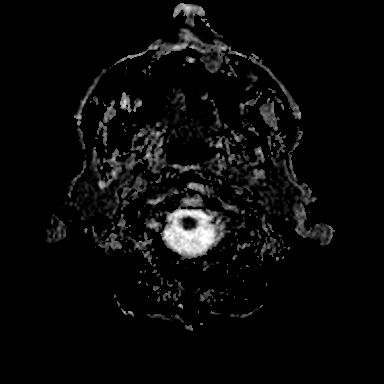
[im 24/48]
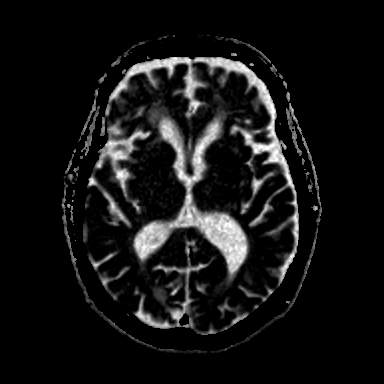
[im 48/48]
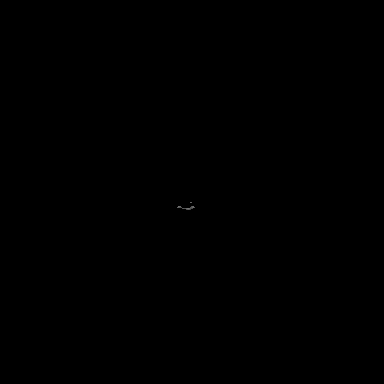

[Series 7: cor dwi_tracew · coronal · 5.0mm · 0.60mm/px · 2 of 40 slices shown]
[im 1/40]
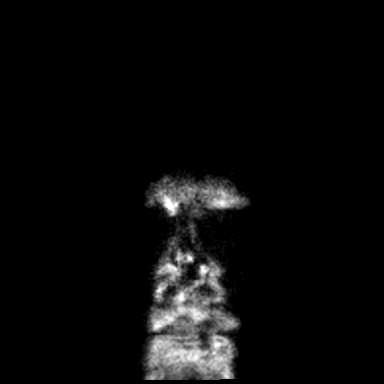
[im 40/40]
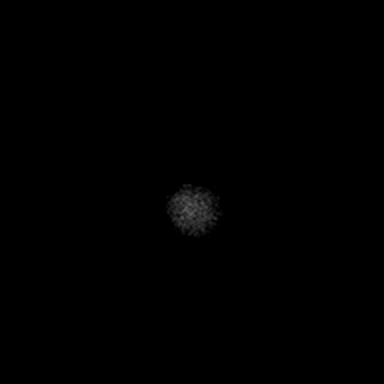

[Series 8: cor dwi_adc · coronal · 5.0mm · 0.60mm/px · 2 of 39 slices shown]
[im 1/39]
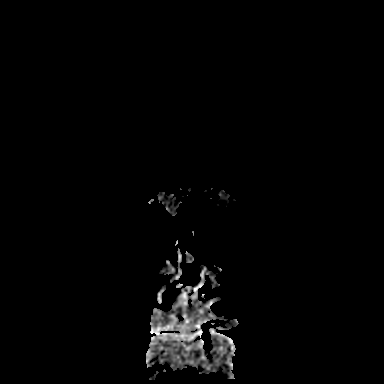
[im 39/39]
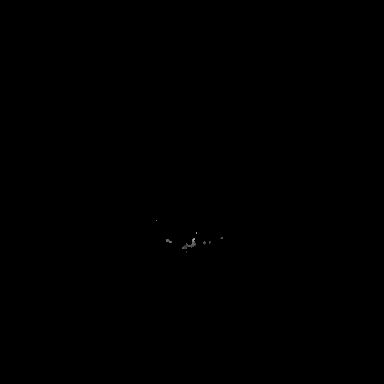

[Series 9: T1 · sagittal · 5.0mm · 0.62mm/px · 1 of 23 slices shown (1 of 2)]
[im 1/23]
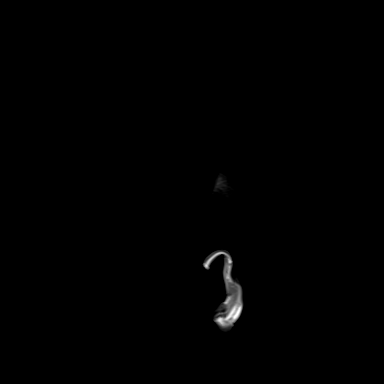

[Series 10: T2 · axial · 5.0mm · 0.53mm/px · 1 of 25 slices shown]
[im 1/25]
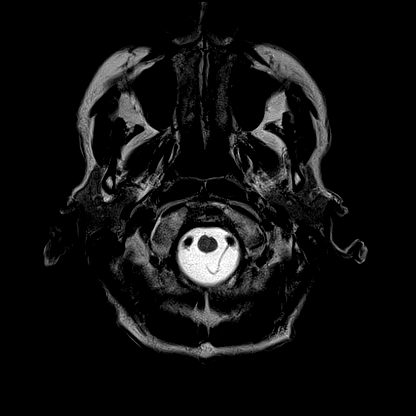

[Series 11: mag_images · axial · 3.0mm · 0.90mm/px · z∈[-73,+103]mm · 3 of 60 slices shown]
[im 1/60]
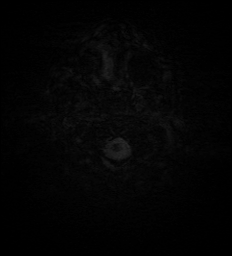
[im 30/60]
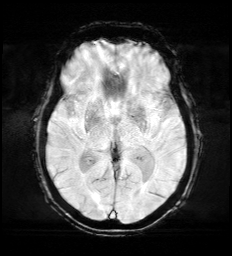
[im 60/60]
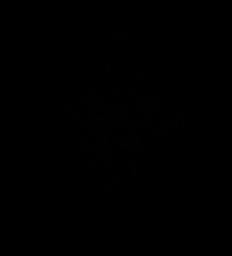

[Series 12: pha_images · axial · 3.0mm · 0.90mm/px · z∈[-73,+103]mm · 3 of 58 slices shown]
[im 1/58]
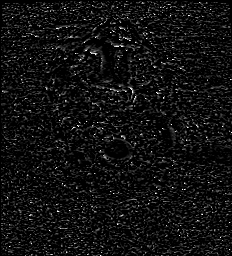
[im 29/58]
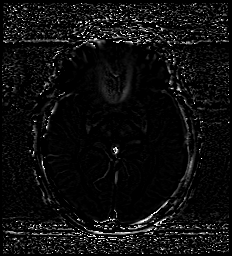
[im 58/58]
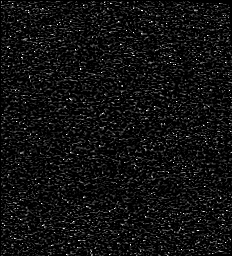

[Series 13: swi_images · axial · 3.0mm · 0.90mm/px · z∈[-73,+103]mm · 3 of 60 slices shown]
[im 1/60]
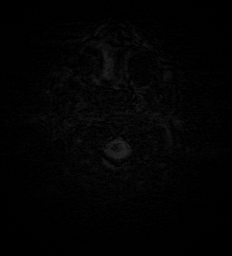
[im 30/60]
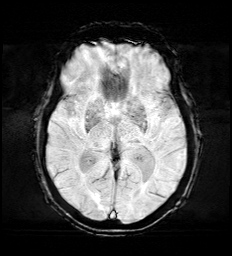
[im 60/60]
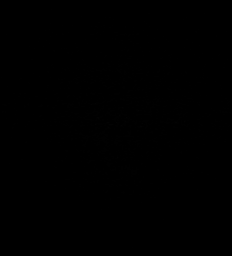

[Series 15: FLAIR · axial · 3.0mm · 0.53mm/px · z∈[-66,+96]mm · 3 of 55 slices shown]
[im 1/55]
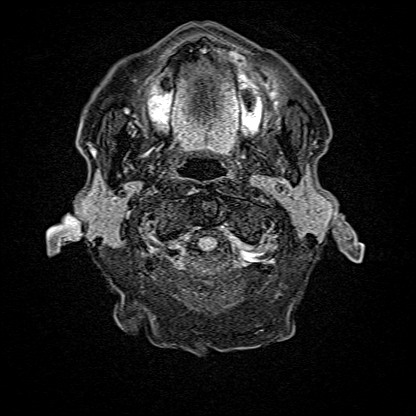
[im 28/55]
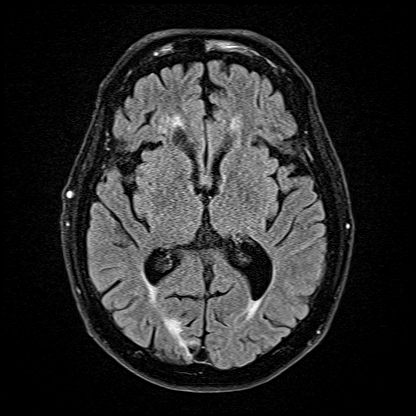
[im 55/55]
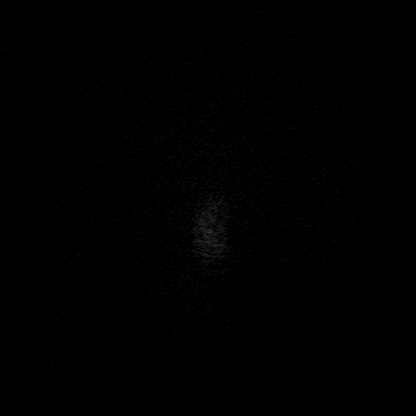

[Series 16: T1 · axial · 1.0mm · 0.98mm/px · z∈[-62,+97]mm · 8 of 160 slices shown (2 of 2)]
[im 1/160]
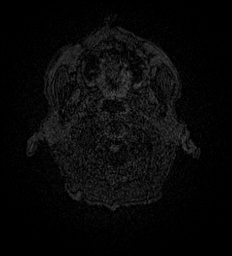
[im 20/160]
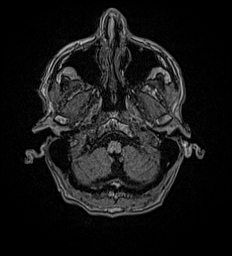
[im 40/160]
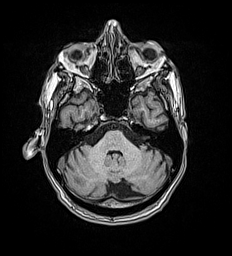
[im 60/160]
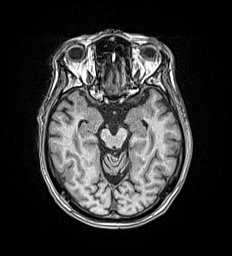
[im 100/160]
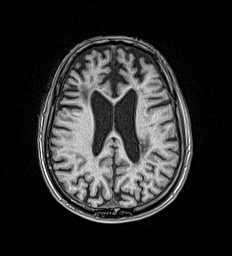
[im 120/160]
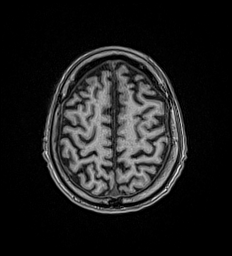
[im 140/160]
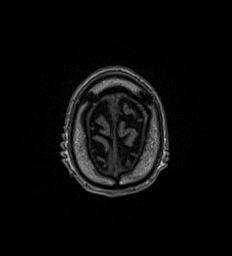
[im 160/160]
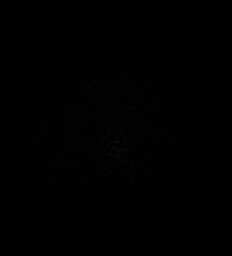

[Series 17: T2 post-contrast · coronal · 5.0mm · 0.57mm/px · 2 of 29 slices shown]
[im 1/29]
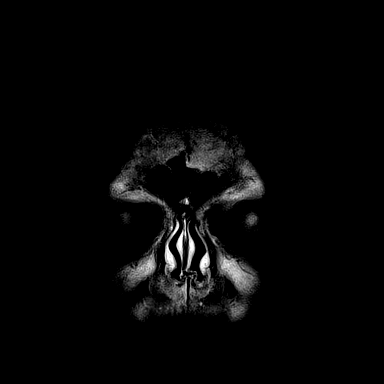
[im 29/29]
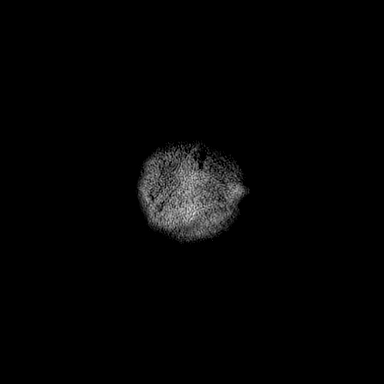

[Series 18: T1 post-contrast · axial · 1.0mm · 0.98mm/px · z∈[-62,+97]mm · 9 of 160 slices shown (1 of 3)]
[im 1/160]
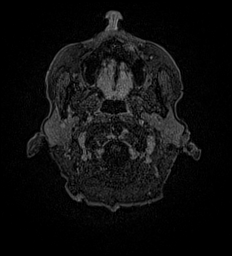
[im 20/160]
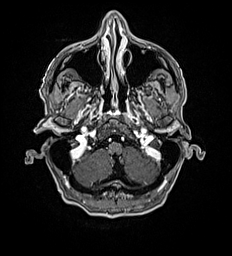
[im 40/160]
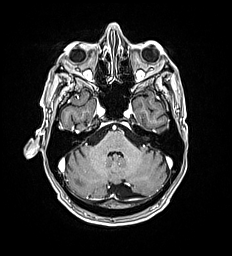
[im 60/160]
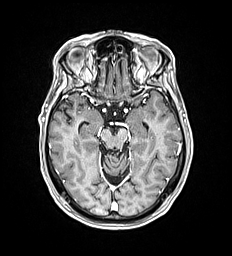
[im 80/160]
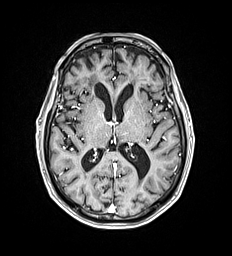
[im 100/160]
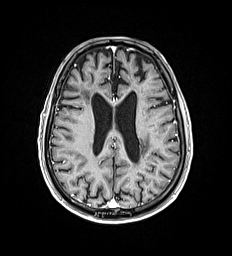
[im 120/160]
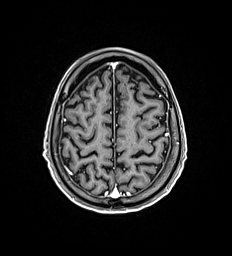
[im 140/160]
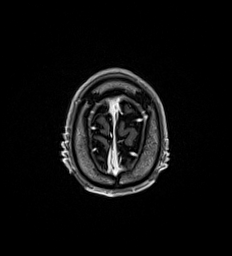
[im 160/160]
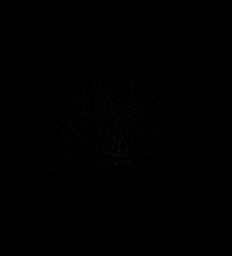

[Series 19: T1 post-contrast · coronal · 5.0mm · 0.57mm/px · 2 of 29 slices shown (2 of 3)]
[im 1/29]
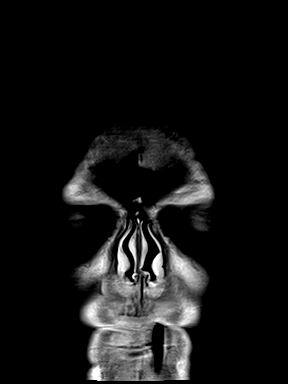
[im 29/29]
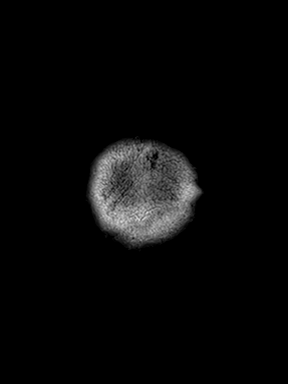

[Series 20: T1 post-contrast · sagittal · 5.0mm · 0.62mm/px · 1 of 23 slices shown (3 of 3)]
[im 1/23]
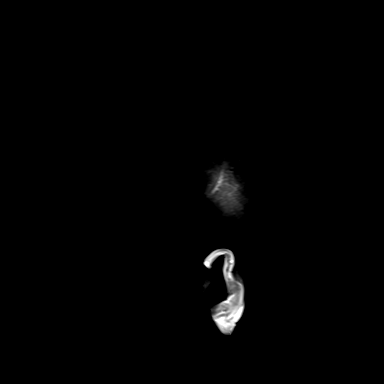

[47 of 48 positions shown; findings below may reference images not displayed]

FINDINGS: Brain: No acute infarction, hemorrhage, hydrocephalus, extra-axial
collection or mass lesion.

Multiple small remote infarcts are seen including within the right
pre frontal gyrus, right occipital lobe and bilateral corona
radiata. Scattered foci of T2 hyperintensity are seen within the
white matter of the cerebral hemispheres and within the pons,
nonspecific, most likely related to chronic small vessel ischemia.

There is prominence of the supratentorial ventricles and cerebral
sulci reflecting parenchymal volume loss.

No focus of abnormal contrast enhancement seen to suggest metastatic
disease to the brain.

Vascular: Normal flow voids.

Skull and upper cervical spine: Stable benign-appearing left
parietal bone lesion. Otherwise, normal marrow signal.

Sinuses/Orbits: Bilateral lens surgery. Paranasal sinuses are clear.
IMPRESSION: 1. No evidence of intracranial metastatic disease.
2. Multiple small remote infarcts are seen within the right pre
frontal gyrus, right occipital lobe and bilateral corona radiata.
3. Moderate chronic small vessel ischemia and parenchymal volume
loss.

## 2022-04-04 ENCOUNTER — Other Ambulatory Visit: Payer: Self-pay | Admitting: Oncology

## 2022-04-12 ENCOUNTER — Other Ambulatory Visit: Payer: Self-pay | Admitting: Nurse Practitioner
